# Patient Record
Sex: Female | Born: 1970
Health system: Southern US, Community
[De-identification: ages and names within clinical notes are randomized; demographics above are authoritative.]

## PROBLEM LIST (undated history)

## (undated) DIAGNOSIS — Z87440 Personal history of urinary (tract) infections: Secondary | ICD-10-CM

## (undated) DIAGNOSIS — Z85850 Personal history of malignant neoplasm of thyroid: Secondary | ICD-10-CM

## (undated) DIAGNOSIS — H9319 Tinnitus, unspecified ear: Secondary | ICD-10-CM

## (undated) DIAGNOSIS — N80109 Endometriosis of ovary, unspecified side, unspecified depth: Secondary | ICD-10-CM

## (undated) DIAGNOSIS — I341 Nonrheumatic mitral (valve) prolapse: Secondary | ICD-10-CM

## (undated) DIAGNOSIS — N898 Other specified noninflammatory disorders of vagina: Secondary | ICD-10-CM

## (undated) DIAGNOSIS — L2089 Other atopic dermatitis: Secondary | ICD-10-CM

## (undated) DIAGNOSIS — B3731 Acute candidiasis of vulva and vagina: Secondary | ICD-10-CM

## (undated) DIAGNOSIS — R809 Proteinuria, unspecified: Secondary | ICD-10-CM

## (undated) DIAGNOSIS — N83209 Unspecified ovarian cyst, unspecified side: Secondary | ICD-10-CM

## (undated) DIAGNOSIS — J309 Allergic rhinitis, unspecified: Secondary | ICD-10-CM

## (undated) DIAGNOSIS — R102 Pelvic and perineal pain: Secondary | ICD-10-CM

## (undated) DIAGNOSIS — B373 Candidiasis of vulva and vagina: Secondary | ICD-10-CM

## (undated) DIAGNOSIS — Z8619 Personal history of other infectious and parasitic diseases: Secondary | ICD-10-CM

## (undated) DIAGNOSIS — J0100 Acute maxillary sinusitis, unspecified: Secondary | ICD-10-CM

## (undated) DIAGNOSIS — K5792 Diverticulitis of intestine, part unspecified, without perforation or abscess without bleeding: Secondary | ICD-10-CM

## (undated) DIAGNOSIS — Z8742 Personal history of other diseases of the female genital tract: Secondary | ICD-10-CM

## (undated) DIAGNOSIS — G43009 Migraine without aura, not intractable, without status migrainosus: Secondary | ICD-10-CM

## (undated) DIAGNOSIS — L309 Dermatitis, unspecified: Secondary | ICD-10-CM

## (undated) DIAGNOSIS — R112 Nausea with vomiting, unspecified: Secondary | ICD-10-CM

## (undated) DIAGNOSIS — L290 Pruritus ani: Secondary | ICD-10-CM

## (undated) DIAGNOSIS — K649 Unspecified hemorrhoids: Secondary | ICD-10-CM

## (undated) DIAGNOSIS — N760 Acute vaginitis: Secondary | ICD-10-CM

## (undated) DIAGNOSIS — R87619 Unspecified abnormal cytological findings in specimens from cervix uteri: Secondary | ICD-10-CM

## (undated) DIAGNOSIS — IMO0001 Reserved for inherently not codable concepts without codable children: Secondary | ICD-10-CM

## (undated) DIAGNOSIS — Z8679 Personal history of other diseases of the circulatory system: Secondary | ICD-10-CM

## (undated) DIAGNOSIS — R109 Unspecified abdominal pain: Secondary | ICD-10-CM

## (undated) DIAGNOSIS — Z9889 Other specified postprocedural states: Secondary | ICD-10-CM

## (undated) DIAGNOSIS — N809 Endometriosis, unspecified: Secondary | ICD-10-CM

## (undated) DIAGNOSIS — D219 Benign neoplasm of connective and other soft tissue, unspecified: Secondary | ICD-10-CM

## (undated) DIAGNOSIS — R1032 Left lower quadrant pain: Secondary | ICD-10-CM

## (undated) DIAGNOSIS — IMO0002 Reserved for concepts with insufficient information to code with codable children: Secondary | ICD-10-CM

## (undated) DIAGNOSIS — K219 Gastro-esophageal reflux disease without esophagitis: Secondary | ICD-10-CM

## (undated) DIAGNOSIS — N801 Endometriosis of ovary: Secondary | ICD-10-CM

## (undated) HISTORY — DX: Personal history of other diseases of the circulatory system: Z86.79

## (undated) HISTORY — DX: Candidiasis of vulva and vagina: B37.3

## (undated) HISTORY — DX: Reserved for inherently not codable concepts without codable children: IMO0001

## (undated) HISTORY — DX: Tinnitus, unspecified ear: H93.19

## (undated) HISTORY — DX: Gastro-esophageal reflux disease without esophagitis: K21.9

## (undated) HISTORY — DX: Dermatitis, unspecified: L30.9

## (undated) HISTORY — DX: Proteinuria, unspecified: R80.9

## (undated) HISTORY — DX: Personal history of other diseases of the female genital tract: Z87.42

## (undated) HISTORY — DX: Left lower quadrant pain: R10.32

## (undated) HISTORY — DX: Unspecified abnormal cytological findings in specimens from cervix uteri: R87.619

## (undated) HISTORY — DX: Acute vaginitis: N76.0

## (undated) HISTORY — DX: Unspecified abdominal pain: R10.9

## (undated) HISTORY — DX: Allergic rhinitis, unspecified: J30.9

## (undated) HISTORY — DX: Endometriosis, unspecified: N80.9

## (undated) HISTORY — PX: LAPAROSCOPIC OVARIAN CYSTECTOMY: SUR786

## (undated) HISTORY — DX: Diverticulitis of intestine, part unspecified, without perforation or abscess without bleeding: K57.92

## (undated) HISTORY — DX: Unspecified ovarian cyst, unspecified side: N83.209

## (undated) HISTORY — DX: Benign neoplasm of connective and other soft tissue, unspecified: D21.9

## (undated) HISTORY — DX: Personal history of urinary (tract) infections: Z87.440

## (undated) HISTORY — DX: Endometriosis of ovary, unspecified side, unspecified depth: N80.109

## (undated) HISTORY — DX: Personal history of other infectious and parasitic diseases: Z86.19

## (undated) HISTORY — DX: Reserved for concepts with insufficient information to code with codable children: IMO0002

## (undated) HISTORY — DX: Acute maxillary sinusitis, unspecified: J01.00

## (undated) HISTORY — PX: WISDOM TOOTH EXTRACTION: SHX21

## (undated) HISTORY — DX: Pruritus ani: L29.0

## (undated) HISTORY — DX: Acute candidiasis of vulva and vagina: B37.31

## (undated) HISTORY — DX: Nonrheumatic mitral (valve) prolapse: I34.1

## (undated) HISTORY — DX: Migraine without aura, not intractable, without status migrainosus: G43.009

## (undated) HISTORY — DX: Other specified noninflammatory disorders of vagina: N89.8

## (undated) HISTORY — DX: Pelvic and perineal pain: R10.2

## (undated) HISTORY — DX: Other atopic dermatitis: L20.89

## (undated) HISTORY — DX: Personal history of malignant neoplasm of thyroid: Z85.850

## (undated) HISTORY — DX: Unspecified hemorrhoids: K64.9

## (undated) HISTORY — DX: Endometriosis of ovary: N80.1

---

## 1999-01-10 ENCOUNTER — Other Ambulatory Visit: Admission: RE | Admit: 1999-01-10 | Discharge: 1999-01-10 | Payer: Self-pay | Admitting: *Deleted

## 2000-01-09 ENCOUNTER — Other Ambulatory Visit: Admission: RE | Admit: 2000-01-09 | Discharge: 2000-01-09 | Payer: Self-pay | Admitting: *Deleted

## 2000-12-31 ENCOUNTER — Other Ambulatory Visit: Admission: RE | Admit: 2000-12-31 | Discharge: 2000-12-31 | Payer: Self-pay | Admitting: Obstetrics and Gynecology

## 2001-01-20 ENCOUNTER — Other Ambulatory Visit: Admission: RE | Admit: 2001-01-20 | Discharge: 2001-01-20 | Payer: Self-pay | Admitting: Obstetrics and Gynecology

## 2001-05-11 ENCOUNTER — Inpatient Hospital Stay (HOSPITAL_COMMUNITY): Admission: AD | Admit: 2001-05-11 | Discharge: 2001-05-11 | Payer: Self-pay | Admitting: Obstetrics and Gynecology

## 2001-06-04 ENCOUNTER — Inpatient Hospital Stay (HOSPITAL_COMMUNITY): Admission: AD | Admit: 2001-06-04 | Discharge: 2001-06-04 | Payer: Self-pay | Admitting: Obstetrics & Gynecology

## 2001-06-05 ENCOUNTER — Inpatient Hospital Stay (HOSPITAL_COMMUNITY): Admission: AD | Admit: 2001-06-05 | Discharge: 2001-06-05 | Payer: Self-pay | Admitting: Obstetrics and Gynecology

## 2001-06-06 ENCOUNTER — Inpatient Hospital Stay (HOSPITAL_COMMUNITY): Admission: AD | Admit: 2001-06-06 | Discharge: 2001-06-06 | Payer: Self-pay | Admitting: *Deleted

## 2001-07-30 ENCOUNTER — Inpatient Hospital Stay (HOSPITAL_COMMUNITY): Admission: AD | Admit: 2001-07-30 | Discharge: 2001-08-02 | Payer: Self-pay | Admitting: Obstetrics and Gynecology

## 2001-08-04 ENCOUNTER — Encounter: Admission: RE | Admit: 2001-08-04 | Discharge: 2001-09-03 | Payer: Self-pay | Admitting: Obstetrics and Gynecology

## 2001-09-29 ENCOUNTER — Other Ambulatory Visit: Admission: RE | Admit: 2001-09-29 | Discharge: 2001-09-29 | Payer: Self-pay | Admitting: Obstetrics and Gynecology

## 2002-05-12 ENCOUNTER — Encounter: Payer: Self-pay | Admitting: Otolaryngology

## 2002-05-12 ENCOUNTER — Encounter: Admission: RE | Admit: 2002-05-12 | Discharge: 2002-05-12 | Payer: Self-pay | Admitting: Otolaryngology

## 2002-10-08 ENCOUNTER — Other Ambulatory Visit: Admission: RE | Admit: 2002-10-08 | Discharge: 2002-10-08 | Payer: Self-pay | Admitting: Obstetrics and Gynecology

## 2003-06-29 ENCOUNTER — Encounter: Admission: RE | Admit: 2003-06-29 | Discharge: 2003-06-29 | Payer: Self-pay | Admitting: Obstetrics & Gynecology

## 2003-06-29 ENCOUNTER — Encounter: Payer: Self-pay | Admitting: Obstetrics & Gynecology

## 2003-10-12 ENCOUNTER — Other Ambulatory Visit: Admission: RE | Admit: 2003-10-12 | Discharge: 2003-10-12 | Payer: Self-pay | Admitting: Obstetrics and Gynecology

## 2004-04-20 ENCOUNTER — Inpatient Hospital Stay (HOSPITAL_COMMUNITY): Admission: AD | Admit: 2004-04-20 | Discharge: 2004-04-20 | Payer: Self-pay | Admitting: Obstetrics and Gynecology

## 2004-04-21 ENCOUNTER — Inpatient Hospital Stay (HOSPITAL_COMMUNITY): Admission: AD | Admit: 2004-04-21 | Discharge: 2004-04-21 | Payer: Self-pay | Admitting: Obstetrics and Gynecology

## 2004-04-23 ENCOUNTER — Inpatient Hospital Stay (HOSPITAL_COMMUNITY): Admission: AD | Admit: 2004-04-23 | Discharge: 2004-04-24 | Payer: Self-pay | Admitting: Obstetrics & Gynecology

## 2004-05-15 ENCOUNTER — Encounter (INDEPENDENT_AMBULATORY_CARE_PROVIDER_SITE_OTHER): Payer: Self-pay | Admitting: Specialist

## 2004-05-15 ENCOUNTER — Inpatient Hospital Stay (HOSPITAL_COMMUNITY): Admission: AD | Admit: 2004-05-15 | Discharge: 2004-05-17 | Payer: Self-pay | Admitting: *Deleted

## 2004-10-28 DIAGNOSIS — L290 Pruritus ani: Secondary | ICD-10-CM

## 2004-10-28 DIAGNOSIS — L309 Dermatitis, unspecified: Secondary | ICD-10-CM

## 2004-10-28 DIAGNOSIS — R102 Pelvic and perineal pain: Secondary | ICD-10-CM

## 2004-10-28 HISTORY — DX: Pruritus ani: L29.0

## 2004-10-28 HISTORY — DX: Dermatitis, unspecified: L30.9

## 2004-10-28 HISTORY — DX: Pelvic and perineal pain: R10.2

## 2005-01-16 ENCOUNTER — Encounter: Admission: RE | Admit: 2005-01-16 | Discharge: 2005-01-16 | Payer: Self-pay | Admitting: Otolaryngology

## 2005-08-21 ENCOUNTER — Other Ambulatory Visit: Admission: RE | Admit: 2005-08-21 | Discharge: 2005-08-21 | Payer: Self-pay | Admitting: Obstetrics and Gynecology

## 2005-10-28 DIAGNOSIS — K649 Unspecified hemorrhoids: Secondary | ICD-10-CM

## 2005-10-28 DIAGNOSIS — B373 Candidiasis of vulva and vagina: Secondary | ICD-10-CM

## 2005-10-28 DIAGNOSIS — B3731 Acute candidiasis of vulva and vagina: Secondary | ICD-10-CM

## 2005-10-28 HISTORY — DX: Unspecified hemorrhoids: K64.9

## 2005-10-28 HISTORY — DX: Candidiasis of vulva and vagina: B37.3

## 2005-10-28 HISTORY — DX: Acute candidiasis of vulva and vagina: B37.31

## 2006-08-22 ENCOUNTER — Other Ambulatory Visit: Admission: RE | Admit: 2006-08-22 | Discharge: 2006-08-22 | Payer: Self-pay | Admitting: Obstetrics and Gynecology

## 2006-10-28 ENCOUNTER — Encounter: Payer: Self-pay | Admitting: Family Medicine

## 2006-10-28 DIAGNOSIS — N83209 Unspecified ovarian cyst, unspecified side: Secondary | ICD-10-CM

## 2006-10-28 DIAGNOSIS — Z8742 Personal history of other diseases of the female genital tract: Secondary | ICD-10-CM

## 2006-10-28 HISTORY — DX: Unspecified ovarian cyst, unspecified side: N83.209

## 2006-10-28 HISTORY — DX: Personal history of other diseases of the female genital tract: Z87.42

## 2007-02-03 ENCOUNTER — Encounter: Payer: Self-pay | Admitting: Family Medicine

## 2007-02-03 ENCOUNTER — Ambulatory Visit: Payer: Self-pay | Admitting: Family Medicine

## 2007-02-03 DIAGNOSIS — G43009 Migraine without aura, not intractable, without status migrainosus: Secondary | ICD-10-CM

## 2007-02-03 DIAGNOSIS — K219 Gastro-esophageal reflux disease without esophagitis: Secondary | ICD-10-CM

## 2007-02-03 DIAGNOSIS — H9319 Tinnitus, unspecified ear: Secondary | ICD-10-CM | POA: Insufficient documentation

## 2007-02-03 DIAGNOSIS — J309 Allergic rhinitis, unspecified: Secondary | ICD-10-CM

## 2007-02-03 DIAGNOSIS — L2089 Other atopic dermatitis: Secondary | ICD-10-CM

## 2007-02-18 ENCOUNTER — Ambulatory Visit: Payer: Self-pay | Admitting: Family Medicine

## 2007-02-18 LAB — CONVERTED CEMR LAB
BUN: 12 mg/dL (ref 6–23)
Basophils Relative: 0.2 % (ref 0.0–1.0)
Bilirubin, Direct: 0.1 mg/dL (ref 0.0–0.3)
CO2: 30 meq/L (ref 19–32)
Cholesterol: 167 mg/dL (ref 0–200)
Creatinine, Ser: 0.8 mg/dL (ref 0.4–1.2)
Eosinophils Relative: 4.4 % (ref 0.0–5.0)
GFR calc Af Amer: 106 mL/min
Glucose, Bld: 96 mg/dL (ref 70–99)
HCT: 40.1 % (ref 36.0–46.0)
HDL: 64.6 mg/dL (ref >39.0)
Hemoglobin: 14.2 g/dL (ref 12.0–15.0)
Lymphocytes Relative: 37.7 % (ref 12.0–46.0)
Monocytes Absolute: 0.6 10*3/uL (ref 0.2–0.7)
Monocytes Relative: 9.3 % (ref 3.0–11.0)
Neutro Abs: 3.2 10*3/uL (ref 1.4–7.7)
Neutrophils Relative %: 48.4 % (ref 43.0–77.0)
Potassium: 4.3 meq/L (ref 3.5–5.1)
RDW: 13.1 % (ref 11.5–14.6)
Sodium: 141 meq/L (ref 135–145)
TSH: 1.88 microintl units/mL (ref 0.35–5.50)
Total Bilirubin: 0.7 mg/dL (ref 0.3–1.2)
Total Protein: 6.8 g/dL (ref 6.0–8.3)
VLDL: 9 mg/dL (ref 0–40)

## 2007-03-13 ENCOUNTER — Telehealth: Payer: Self-pay | Admitting: Family Medicine

## 2007-07-06 ENCOUNTER — Telehealth: Payer: Self-pay | Admitting: Family Medicine

## 2007-08-31 ENCOUNTER — Ambulatory Visit: Payer: Self-pay | Admitting: Internal Medicine

## 2007-09-01 ENCOUNTER — Telehealth (INDEPENDENT_AMBULATORY_CARE_PROVIDER_SITE_OTHER): Payer: Self-pay | Admitting: *Deleted

## 2008-01-16 ENCOUNTER — Encounter: Payer: Self-pay | Admitting: Family Medicine

## 2008-01-20 ENCOUNTER — Telehealth: Payer: Self-pay | Admitting: Family Medicine

## 2008-01-28 ENCOUNTER — Ambulatory Visit: Payer: Self-pay | Admitting: Family Medicine

## 2008-01-28 LAB — CONVERTED CEMR LAB: Rapid Strep: NEGATIVE

## 2008-02-15 ENCOUNTER — Telehealth: Payer: Self-pay | Admitting: Internal Medicine

## 2008-03-18 ENCOUNTER — Encounter: Payer: Self-pay | Admitting: Internal Medicine

## 2008-06-15 ENCOUNTER — Ambulatory Visit: Payer: Self-pay | Admitting: Family Medicine

## 2008-06-17 ENCOUNTER — Telehealth: Payer: Self-pay | Admitting: Family Medicine

## 2008-07-24 ENCOUNTER — Encounter: Admission: RE | Admit: 2008-07-24 | Discharge: 2008-07-24 | Payer: Self-pay | Admitting: Orthopedic Surgery

## 2008-07-26 ENCOUNTER — Ambulatory Visit: Payer: Self-pay | Admitting: Family Medicine

## 2008-07-28 LAB — HM PAP SMEAR

## 2008-08-31 ENCOUNTER — Ambulatory Visit: Payer: Self-pay | Admitting: Family Medicine

## 2008-10-27 ENCOUNTER — Ambulatory Visit: Payer: Self-pay | Admitting: Family Medicine

## 2008-10-27 LAB — CONVERTED CEMR LAB: Rapid Strep: NEGATIVE

## 2008-10-28 DIAGNOSIS — R1032 Left lower quadrant pain: Secondary | ICD-10-CM

## 2008-10-28 DIAGNOSIS — B373 Candidiasis of vulva and vagina: Secondary | ICD-10-CM

## 2008-10-28 DIAGNOSIS — R809 Proteinuria, unspecified: Secondary | ICD-10-CM

## 2008-10-28 DIAGNOSIS — B3731 Acute candidiasis of vulva and vagina: Secondary | ICD-10-CM

## 2008-10-28 HISTORY — DX: Acute candidiasis of vulva and vagina: B37.31

## 2008-10-28 HISTORY — DX: Proteinuria, unspecified: R80.9

## 2008-10-28 HISTORY — DX: Candidiasis of vulva and vagina: B37.3

## 2008-10-28 HISTORY — DX: Left lower quadrant pain: R10.32

## 2008-10-31 ENCOUNTER — Telehealth: Payer: Self-pay | Admitting: Family Medicine

## 2008-11-09 ENCOUNTER — Telehealth: Payer: Self-pay | Admitting: Family Medicine

## 2008-12-15 ENCOUNTER — Telehealth: Payer: Self-pay | Admitting: Family Medicine

## 2009-04-20 ENCOUNTER — Encounter (INDEPENDENT_AMBULATORY_CARE_PROVIDER_SITE_OTHER): Payer: Self-pay | Admitting: Obstetrics and Gynecology

## 2009-04-20 ENCOUNTER — Ambulatory Visit (HOSPITAL_COMMUNITY): Admission: RE | Admit: 2009-04-20 | Discharge: 2009-04-20 | Payer: Self-pay | Admitting: Obstetrics and Gynecology

## 2009-06-13 ENCOUNTER — Ambulatory Visit: Payer: Self-pay | Admitting: Family Medicine

## 2009-06-13 DIAGNOSIS — N801 Endometriosis of ovary: Secondary | ICD-10-CM

## 2009-06-20 ENCOUNTER — Ambulatory Visit: Payer: Self-pay | Admitting: Family Medicine

## 2009-06-26 LAB — CONVERTED CEMR LAB
ALT: 17 units/L (ref 0–35)
Albumin: 4.2 g/dL (ref 3.5–5.2)
Alkaline Phosphatase: 54 units/L (ref 39–117)
Basophils Relative: 0 % (ref 0.0–3.0)
Bilirubin, Direct: 0 mg/dL (ref 0.0–0.3)
CO2: 28 meq/L (ref 19–32)
Chloride: 107 meq/L (ref 96–112)
Creatinine, Ser: 0.8 mg/dL (ref 0.4–1.2)
Eosinophils Absolute: 0.3 10*3/uL (ref 0.0–0.7)
Eosinophils Relative: 4.6 % (ref 0.0–5.0)
Hemoglobin: 14.9 g/dL (ref 12.0–15.0)
LDL Cholesterol: 96 mg/dL (ref 0–99)
Lymphocytes Relative: 38 % (ref 12.0–46.0)
MCHC: 33.7 g/dL (ref 30.0–36.0)
MCV: 94.9 fL (ref 78.0–100.0)
Monocytes Absolute: 0.5 10*3/uL (ref 0.1–1.0)
Neutro Abs: 3 10*3/uL (ref 1.4–7.7)
Neutrophils Relative %: 49.6 % (ref 43.0–77.0)
RBC: 4.66 M/uL (ref 3.87–5.11)
Sodium: 139 meq/L (ref 135–145)
Total CHOL/HDL Ratio: 3
Total Protein: 7.5 g/dL (ref 6.0–8.3)
Triglycerides: 65 mg/dL (ref 0.0–149.0)
WBC: 6.1 10*3/uL (ref 4.5–10.5)

## 2009-07-19 ENCOUNTER — Encounter: Payer: Self-pay | Admitting: Family Medicine

## 2009-08-03 ENCOUNTER — Ambulatory Visit: Payer: Self-pay | Admitting: Family Medicine

## 2009-11-02 ENCOUNTER — Ambulatory Visit: Payer: Self-pay | Admitting: Family Medicine

## 2009-11-02 DIAGNOSIS — J0100 Acute maxillary sinusitis, unspecified: Secondary | ICD-10-CM

## 2009-11-23 ENCOUNTER — Telehealth: Payer: Self-pay | Admitting: Family Medicine

## 2009-12-14 ENCOUNTER — Telehealth (INDEPENDENT_AMBULATORY_CARE_PROVIDER_SITE_OTHER): Payer: Self-pay | Admitting: *Deleted

## 2009-12-15 ENCOUNTER — Ambulatory Visit: Payer: Self-pay | Admitting: Family Medicine

## 2009-12-15 ENCOUNTER — Telehealth: Payer: Self-pay | Admitting: Family Medicine

## 2009-12-18 ENCOUNTER — Telehealth: Payer: Self-pay | Admitting: Family Medicine

## 2010-04-24 ENCOUNTER — Telehealth: Payer: Self-pay | Admitting: Family Medicine

## 2010-04-25 ENCOUNTER — Encounter: Payer: Self-pay | Admitting: Family Medicine

## 2010-05-02 ENCOUNTER — Telehealth: Payer: Self-pay | Admitting: Family Medicine

## 2010-05-08 ENCOUNTER — Telehealth: Payer: Self-pay | Admitting: Family Medicine

## 2010-05-09 ENCOUNTER — Ambulatory Visit: Payer: Self-pay | Admitting: Family Medicine

## 2010-05-09 DIAGNOSIS — B373 Candidiasis of vulva and vagina: Secondary | ICD-10-CM

## 2010-05-09 DIAGNOSIS — N760 Acute vaginitis: Secondary | ICD-10-CM | POA: Insufficient documentation

## 2010-05-30 ENCOUNTER — Ambulatory Visit: Payer: Self-pay | Admitting: Family Medicine

## 2010-06-12 ENCOUNTER — Telehealth: Payer: Self-pay | Admitting: Family Medicine

## 2010-07-06 ENCOUNTER — Ambulatory Visit: Payer: Self-pay | Admitting: Family Medicine

## 2010-07-06 DIAGNOSIS — R109 Unspecified abdominal pain: Secondary | ICD-10-CM

## 2010-07-06 DIAGNOSIS — IMO0001 Reserved for inherently not codable concepts without codable children: Secondary | ICD-10-CM

## 2010-07-06 LAB — CONVERTED CEMR LAB
Glucose, Urine, Semiquant: NEGATIVE
Ketones, urine, test strip: NEGATIVE
Nitrite: NEGATIVE
Protein, U semiquant: NEGATIVE

## 2010-07-07 ENCOUNTER — Ambulatory Visit: Payer: Self-pay | Admitting: Interventional Radiology

## 2010-07-07 ENCOUNTER — Ambulatory Visit (HOSPITAL_BASED_OUTPATIENT_CLINIC_OR_DEPARTMENT_OTHER): Admission: RE | Admit: 2010-07-07 | Discharge: 2010-07-07 | Payer: Self-pay | Admitting: Family Medicine

## 2010-07-07 LAB — CONVERTED CEMR LAB
BUN: 9 mg/dL (ref 6–23)
Creatinine, Ser: 0.91 mg/dL (ref 0.40–1.20)
Eosinophils Absolute: 0.3 10*3/uL (ref 0.0–0.7)
Eosinophils Relative: 2 % (ref 0–5)
Glucose, Bld: 90 mg/dL (ref 70–99)
HCT: 36.8 % (ref 36.0–46.0)
Hemoglobin: 12.6 g/dL (ref 12.0–15.0)
Lymphs Abs: 2.7 10*3/uL (ref 0.7–4.0)
MCHC: 34.2 g/dL (ref 30.0–36.0)
MCV: 92.2 fL (ref 78.0–100.0)
Monocytes Absolute: 1.1 10*3/uL — ABNORMAL HIGH (ref 0.1–1.0)
Monocytes Relative: 9 % (ref 3–12)
Neutrophils Relative %: 67 % (ref 43–77)
Potassium: 4.1 meq/L (ref 3.5–5.3)
RBC: 3.99 M/uL (ref 3.87–5.11)
WBC: 12.3 10*3/uL — ABNORMAL HIGH (ref 4.0–10.5)

## 2010-07-09 ENCOUNTER — Telehealth (INDEPENDENT_AMBULATORY_CARE_PROVIDER_SITE_OTHER): Payer: Self-pay | Admitting: *Deleted

## 2010-07-09 ENCOUNTER — Ambulatory Visit: Payer: Self-pay | Admitting: Internal Medicine

## 2010-07-09 DIAGNOSIS — N809 Endometriosis, unspecified: Secondary | ICD-10-CM | POA: Insufficient documentation

## 2010-07-09 DIAGNOSIS — R933 Abnormal findings on diagnostic imaging of other parts of digestive tract: Secondary | ICD-10-CM

## 2010-07-09 DIAGNOSIS — R1032 Left lower quadrant pain: Secondary | ICD-10-CM | POA: Insufficient documentation

## 2010-07-09 DIAGNOSIS — R935 Abnormal findings on diagnostic imaging of other abdominal regions, including retroperitoneum: Secondary | ICD-10-CM

## 2010-07-10 ENCOUNTER — Telehealth (INDEPENDENT_AMBULATORY_CARE_PROVIDER_SITE_OTHER): Payer: Self-pay | Admitting: *Deleted

## 2010-07-10 ENCOUNTER — Ambulatory Visit: Payer: Self-pay | Admitting: Internal Medicine

## 2010-07-10 LAB — HM COLONOSCOPY

## 2010-07-11 ENCOUNTER — Telehealth: Payer: Self-pay | Admitting: Internal Medicine

## 2010-07-13 ENCOUNTER — Telehealth: Payer: Self-pay | Admitting: Internal Medicine

## 2010-07-13 ENCOUNTER — Encounter: Payer: Self-pay | Admitting: Internal Medicine

## 2010-07-18 ENCOUNTER — Telehealth: Payer: Self-pay | Admitting: Internal Medicine

## 2010-07-18 ENCOUNTER — Encounter: Payer: Self-pay | Admitting: Internal Medicine

## 2010-07-20 ENCOUNTER — Ambulatory Visit: Payer: Self-pay | Admitting: Internal Medicine

## 2010-08-08 ENCOUNTER — Ambulatory Visit: Payer: Self-pay | Admitting: Internal Medicine

## 2010-08-08 DIAGNOSIS — K5732 Diverticulitis of large intestine without perforation or abscess without bleeding: Secondary | ICD-10-CM | POA: Insufficient documentation

## 2010-08-09 ENCOUNTER — Ambulatory Visit: Payer: Self-pay | Admitting: Family Medicine

## 2010-08-30 ENCOUNTER — Ambulatory Visit: Payer: Self-pay | Admitting: Family Medicine

## 2010-09-06 ENCOUNTER — Telehealth: Payer: Self-pay | Admitting: Family Medicine

## 2010-11-27 NOTE — Progress Notes (Signed)
Summary: med for congestion   Phone Note Call from Patient Call back at [phone]   Caller: Patient Call For: Kerby Nora MD Summary of Call: Patient was just seen this morning and she says that she was not told what to take for the congestion. Please advise. Initial call taken by: Melody Comas,  December 15, 2009 9:57 AM  Follow-up for Phone Call        mucinex DM BID , nasal saline irrigation for nares three times a day  Follow-up by: Kerby Nora MD,  December 15, 2009 11:03 AM  Additional Follow-up for Phone Call Additional follow up Details #1::        Patient advised.Consuello Masse CMA  Additional Follow-up by: Benny Lennert CMA Duncan Dull),  December 15, 2009 11:46 AM

## 2010-11-27 NOTE — Procedures (Signed)
Summary: Colonoscopy  Patient: Anna [name] Note: All result statuses are Final unless otherwise noted.  Tests: (1) Colonoscopy (COL)   COL Colonoscopy           DONE     [facility]     [address]     [place], [place]  [address]           COLONOSCOPY PROCEDURE REPORT           PATIENT:  [name], [name]  MR#:  [id number]     BIRTHDATE:  [birth year], 39 yrs. old  GENDER:  female     ENDOSCOPIST:  Wilhemina Bonito. Eda Keys, MD     REF. BY:  Excell Seltzer, M.D.     PROCEDURE DATE:  07/10/2010     PROCEDURE:  Colonoscopy with biopsy     ASA CLASS:  Class I     INDICATIONS:  Abnormal CT of abdomen, Abdominal pain     MEDICATIONS:   Fentanyl 100 mcg IV, Versed 10 mg IV, Benadryl 50     mg IV           DESCRIPTION OF PROCEDURE:   After the risks benefits and     alternatives of the procedure were thoroughly explained, informed     consent was obtained.  Digital rectal exam was performed and     revealed no abnormalities.   The [id number] endoscope was     introduced through the anus and advanced to the cecum, which was     identified by both the appendix and ileocecal valve, without     limitations.Time to cecum = 7:50 min.  The quality of the prep was     excellent, using MoviPrep.  The instrument was then slowly     withdrawn (time = 8:8min) as the colon was fully examined.     <<PROCEDUREIMAGES>>           FINDINGS:  Focal area of inflammation arising from an diverticulum     and c/w Diverticulitis in the distal ascending colon. Bx of     inflamed tissue taken.  Moderate diverticulosis was found     throughout the colon.  This was otherwise a normal examination of     the colon.  No polyps or cancers were seen.   Retroflexed views in     the rectum revealed no abnormalities.    The scope was then     withdrawn from the patient and the procedure completed.           COMPLICATIONS:  None     ENDOSCOPIC IMPRESSION:     1) Diverticulitis in the ascending colon     2) Moderate  diverticulosis throughout the colon     3) Otherwise normal examination     4) No polyps or cancers           RECOMMENDATIONS:     1) FINISH COURSE OF ANTIBIOTICS     2) Routine colorectal screening recommendationed for "routine     risk" patients with a repeat colonoscopy at age 15 years.     3) Call office next 1-3 days to schedule followup visit with Dr     Marina Goodell in 4 weeks           ______________________________     Wilhemina Bonito. Eda Keys, MD           CC:  Excell Seltzer, MD; The Patient  n.     eSIGNED:   Wilhemina Bonito. Eda Keys at 07/10/2010 04:30 PM           Roslyn Smiling, [phone]  Note: An exclamation mark (!) indicates a result that was not dispersed into the flowsheet. Document Creation Date: 07/10/2010 4:31 PM _______________________________________________________________________  (1) Order result status: Final Collection or observation date-time: 07/10/2010 16:11 Requested date-time:  Receipt date-time:  Reported date-time:  Referring Physician:   Ordering Physician: Fransico Setters [phone]) Specimen Source:  Source: Launa Grill Order Number: [id number] Lab site:   Appended Document: Colonoscopy recall     Procedures Next Due Date:    Colonoscopy: 06/2023

## 2010-11-27 NOTE — Assessment & Plan Note (Signed)
Summary: ABNORMAL CT               (NEW TO GI)      Anna [name]   History of Present Illness Visit Type: consult  Primary GI MD: Yancey Flemings MD Primary Provider: Kerby Nora, MD  Requesting Provider: Kerby Nora, MD  Chief Complaint: LLQ abd pain, diarrhea, constipation, and hemorrhoids  History of Present Illness:   Patient is a 40 year old female referred here for abnormal CTscan. Patient gives a two year history of intermittent LLQ pain / tenderness. Pain used to be worse around menstrual cycle and also during defecation. Last year GYN work up revealed a left ovarian cyst and in June 2010 patient underwent laparoscopic left ovarian cystectomy and right vulvar cyst  removal.  She was also found to have endometriosis at that time. Unfortunately patient developed recurrent LLQ pain within two months of her surgery. Pain intermittent, no longer worse with defecation. Her LLQ is always tender.  Constricting clothes cause increased discomfort. Saw Dr. Pattricia Boss on Friday for one week history of fevers (up to 101 degrees), and LLQ pain. WBC was slightly elevated at 12.3  CTscan obtained and revealed abnormality in right abdomen (see assessment).  Patient on empirical treatment for diverticulitis. She is feeling better but complains of excessive bloating.  No further fevers.  Bowel movement are okay. No rectal bleeding or unusual weight loss.    GI Review of Systems    Reports abdominal pain and  bloating.     Location of  Abdominal pain: LLQ.    Denies acid reflux, belching, chest pain, dysphagia with liquids, dysphagia with solids, heartburn, loss of appetite, nausea, vomiting, vomiting blood, weight loss, and  weight gain.      Reports hemorrhoids.     Denies anal fissure, black tarry stools, change in bowel habit, constipation, diarrhea, diverticulosis, fecal incontinence, heme positive stool, irritable bowel syndrome, jaundice, light color stool, liver problems, rectal bleeding, and  rectal pain.     Current Medications (verified): 1)  Nasonex 50 Mcg/act  Susp (Mometasone Furoate) .... 2 Sprays Per Nostril Daily 2)  Zyrtec-D Allergy & Congestion 5-120 Mg  Tb12 (Cetirizine-Pseudoephedrine) .... Daily As Needed 3)  Multivitamins   Tabs (Multiple Vitamin) .... Take 1 Tablet By Mouth Once A Day 4)  Vitamin B Complex .[name].. 2 Daily 5)  Omega 3 .... 2 Daily 6)  5 Htp .... Daily 7)  Sumatriptan Succinate 100 Mg Tabs (Sumatriptan Succinate) .[name].. 1 Tab By Mouth As Needed Migraine, May Repeat X 1  in 2 Hours If Not Relieved. 8)  Ciprofloxacin Hcl 750 Mg Tabs (Ciprofloxacin Hcl) .[name].. 1 Tab By Mouth Two Times A Day X10 Days 9)  Metronidazole 500 Mg Tabs (Metronidazole) .[name].. 1 Tab By Mouth Every 6 Hours X 10 Days  Allergies (verified): 1)  ! Sulfa 2)  ! Penicillin  Past History:  Past Medical History: MYALGIA (ICD-729.1) ABDOMINAL PAIN, LOWER (ICD-789.09) CANDIDIASIS OF VULVA AND VAGINA (ICD-112.1) UNSPECIFIED VAGINITIS AND VULVOVAGINITIS (ICD-616.10) ACUTE MAXILLARY SINUSITIS (ICD-461.0) SCREENING FOR LIPOID DISORDERS (ICD-V77.91) ENDOMETRIOSIS, OVARY (ICD-617.1) Family Hx of THYROID CANCER, HX OF (ICD-V10.87) GERD (ICD-530.81) COMMON MIGRAINE (ICD-346.10) ALLERGIC RHINITIS (ICD-477.9) ECZEMA, ATOPIC (ICD-691.8) TINNITUS, CHRONIC, LEFT (ICD-388.30)  Past Surgical History: Laparoscopic Cystectomy Ovarian   Family History: father: age 10 healthy mother age 74 thyroid cancer brother: healthy MGM; DM MGF: DM PGF: CABG age 58 Family History of Colon Polyps:Father  No FH of Colon Cancer: Family History of Irritable Bowel Syndrome:Mother   Social  History: Occupation: stay at home mom, part time administrative work Married Childern Former Smoker x 3 pack year history Alcohol use-yes 1-2 beer per week Drug use-no Regular exercise-yes 5 days/ weeks cardio 45 min. Diet:  3 meals, fruit/veggies, no soda, lots of water  Review of Systems       The patient complains of  allergy/sinus, fatigue, fever, headaches-new, muscle pains/cramps, and sore throat.  The patient denies anemia, anxiety-new, arthritis/joint pain, back pain, blood in urine, breast changes/lumps, change in vision, confusion, cough, coughing up blood, depression-new, fainting, hearing problems, heart murmur, heart rhythm changes, itching, menstrual pain, night sweats, nosebleeds, pregnancy symptoms, shortness of breath, skin rash, sleeping problems, swelling of feet/legs, swollen lymph glands, thirst - excessive , urination - excessive , urination changes/pain, urine leakage, vision changes, and voice change.    Vital Signs:  Patient profile:   40 year old female Height:      67.75 inches Weight:      150 pounds BMI:     23.06 BSA:     1.81 Pulse rate:   72 / minute Pulse rhythm:   regular BP sitting:   98 / 60  (left arm) Cuff size:   regular  Vitals Entered By: Ok Anis CMA (July 09, 2010 1:35 PM)  Physical Exam  General:  Well developed, well nourished, no acute distress. Head:  Normocephalic and atraumatic. Eyes:  Conjunctiva pink, no icterus.  Mouth:  No oral lesions. Tongue moist.  Neck:  no obvious masses  Lungs:  Clear throughout to auscultation. Heart:  Regular rate and rhythm; no murmurs, rubs,  or bruits. Abdomen:  Abdomen soft, nondistended. Very mild, localized area of tenderness in LLQ. No obvious masses or hepatomegaly.Normal bowel sounds.  Msk:  Symmetrical with no gross deformities. Normal posture. Extremities:  No palmar erythema, no edema.  Neurologic:  Alert and  oriented x4;  grossly normal neurologically. Skin:  Intact without significant lesions or rashes. Cervical Nodes:  No significant cervical adenopathy. Psych:  Alert and cooperative. Normal mood and affect.   Impression & Recommendations:  Problem # 1:  ABDOMINAL PAIN-LLQ (ICD-789.04) Assessment Deteriorated Two year history of intermittent LLQ pain, persistent despite left ovarian  cystectomy 2010. CTscan reveals abnormal soft tissue in the right abdomen abutting the mesenteric side of the ascending colon and causing fairly extensive surrounding soft tissue abnormality with visible tissue extending into the lumen of the ascending colon.  Primary concern is for colonic neoplasm.  Since right sided diverticulitis cannot be completely excluded and patient has been febrile, she will continue the antibiotics. Her abdominal examination is relatively benign. For further evaluation of CTscan findings, patient will be scheduled for a colonoscopy with biopsies/polypectomy (if indicated).  The risks and benefits of the procedure, as well as alternatives were discussed with the patient and she agrees to proceed.  ADDENDUM: I PERSONALLY INTERVIEWED PATIENT, REVIEWED CT, AND EXAMINED HER. CT / NEOPLASM VS INFLAMMATORY PROCESS. HX C/W LATER. ABD EXAM ESSENTIALLY BENIGN. OK TO PROCEED W/ COLOOSCOPY TO CLARIFY. DISCUSSED W/ PT IN DETAIL   Problem # 2:  ENDOMETRIOSIS (ICD-617.9) Assessment: Comment Only Based on operative report June 2010, appeared mild.   Other Orders: Colonoscopy (Colon)  Patient Instructions: 1)  We have scheduled the Colonoscopy with Dr. Yancey Flemings on 07-10-10. 2)  Directions and brochure provided. 3)  [facility] Patient Information Guide given to patient. 4)  We sent the prescription for the Colonoscopy prep to [facility]. 5)  Copy sent to :  Kerby Nora, MD 6)  The medication list was reviewed and reconciled.  All changed / newly prescribed medications were explained.  A complete medication list was provided to the patient / caregiver. Prescriptions: MOVIPREP 100 GM  SOLR (PEG-KCL-NACL-NASULF-NA ASC-C) As per prep instructions.  #1 x 0   Entered by:   Lowry Ram NCMA   Authorized by:   Willette Cluster NP   Signed by:   Lowry Ram NCMA on 07/09/2010   Method used:   Electronically to        [facility]* (retail)       [address]       [place], [place]  [id number]       Ph: [phone]       Fax: [phone]   RxID:   [id number]

## 2010-11-27 NOTE — Assessment & Plan Note (Signed)
Summary: FEVER, ACHY   Vital Signs:  Patient profile:   40 year old female Weight:      147.5 pounds Temp:     98.7 degrees F oral Pulse rate:   60 / minute Pulse rhythm:   regular BP sitting:   94 / 60  (right arm) Cuff size:   regular  Vitals Entered By: Lowella Petties CMA (July 06, 2010 5:03 PM) CC: Achy, fevers, low abd pain   History of Present Illness: 3-4 days of body ache, joint pain. Started temperature of 101.4 Wed night. Last night severe low abdominal pain. No diarrhea, no constipation, no blood in stool. No worse with eating. Last night worst left lower abdomen...burning pain.   Nothing worsens, better with ibuprofen.   Family with URI symptoms.  Had [place] of ovary on right side last week for intermittant pain .Marland Kitchenhas history of endometriosis.  Had Dr. Reola Calkins check labs for feeling blah , joint ache body ache in last 3-4 months. ANA, RF, anti CCp, TSH , vit D ..Allergies: ! SULFA All normal in last month.  Problems Prior to Update: 1)  Candidiasis of Vulva and Vagina  (ICD-112.1) 2)  Unspecified Vaginitis and Vulvovaginitis  (ICD-616.10) 3)  Acute Maxillary Sinusitis  (ICD-461.0) 4)  Screening For Lipoid Disorders  (ICD-V77.91) 5)  Endometriosis, Ovary  (ICD-617.1) 6)  Fh of Thyroid Cancer, Hx of  (ICD-V10.87) 7)  Gerd  (ICD-530.81) 8)  Common Migraine  (ICD-346.10) 9)  Allergic Rhinitis  (ICD-477.9) 10)  Eczema, Atopic  (ICD-691.8) 11)  Tinnitus, Chronic, Left  (ICD-388.30)  Current Medications (verified): 1)  Nasonex 50 Mcg/act  Susp (Mometasone Furoate) .... 2 Sprays Per Nostril Daily 2)  Zyrtec-D Allergy & Congestion 5-120 Mg  Tb12 (Cetirizine-Pseudoephedrine) .... Daily As Needed 3)  Multivitamins   Tabs (Multiple Vitamin) .... Take 1 Tablet By Mouth Once A Day 4)  Vitamin B Complex .[name].. 2 Daily 5)  Omega 3 .... 2 Daily 6)  5 Htp .... Daily 7)  Sumatriptan Succinate 100 Mg Tabs (Sumatriptan Succinate) .[name].. 1 Tab By Mouth As Needed Migraine, May  Repeat X 1  in 2 Hours If Not Relieved.  Allergies (verified): 1)  ! Sulfa 2)  ! Penicillin  Past History:  Past medical, surgical, family and social histories (including risk factors) reviewed, and no changes noted (except as noted below).  Past Medical History: Reviewed history from 02/03/2007 and no changes required. Allergic rhinitis GERD  Past Surgical History: Reviewed history from 02/03/2007 and no changes required. Denies surgical history  Family History: Reviewed history from 02/03/2007 and no changes required. father: age 23 healthy mother age 66 thyroid cancer brother: healthy MGM; DM MGF: DM PGF: CABG age 39  Social History: Reviewed history from 02/03/2007 and no changes required. Occupation: stay at home mom, part time administrative work Married Former Smoker x 3 pack year history Alcohol use-yes 1-2 beer per week Drug use-no Regular exercise-yes 5 days/ weeks cardio 45 min. Diet:  3 meals, fruit/veggies, no soda, lots of water  Review of Systems General:  Complains of fatigue and fever. CV:  Denies chest pain or discomfort. Resp:  Denies shortness of breath; sore throat  runny nose last week resolved now.[name] GI:  Denies abdominal pain. GU:  Denies hematuria, urinary frequency, and urinary hesitancy.  Physical Exam  General:  fatigued appearing female in NAD Eyes:  No corneal or conjunctival inflammation noted. EOMI. Perrla. Funduscopic exam benign, without hemorrhages, exudates or papilledema. Vision grossly normal. Ears:  External ear exam shows no significant lesions or deformities.  Otoscopic examination reveals clear canals, tympanic membranes are intact bilaterally without bulging, retraction, inflammation or discharge. Hearing is grossly normal bilaterally. Nose:  External nasal examination shows no deformity or inflammation. Nasal mucosa are pink and moist without lesions or exudates. Mouth:  MMM Neck:   no cervical or supraclavicular  lymphadenopathy  no cervical or supraclavicular lymphadenopathy  Lungs:  Normal respiratory effort, chest expands symmetrically. Lungs are clear to auscultation, no crackles or wheezes. Heart:  Normal rate and regular rhythm. S1 and S2 normal without gallop, murmur, click, rub or other extra sounds. Abdomen:  ttp left mid abdomen although mild diffuse tenderness, no rebound, no guardingnormal bowel sounds, no abdominal hernia, no inguinal hernia, no hepatomegaly, and no splenomegaly.   Genitalia:  Pelvic Exam:        External: normal female genitalia without lesions or masses        Vagina: normal without lesions or masses        Cervix: normal without lesions or masses        Adnexa: normal bimanual exam without masses or fullness        Uterus: normal by palpation        Pap smear: not performed Pulses:  R and L posterior tibial pulses are full and equal bilaterally  Extremities:  no edema   Impression & Recommendations:  Problem # 1:  ABDOMINAL PAIN, LOWER (ICD-789.09) No tenderness over cervix and per pt low risk so no GC/chlam done. Urine clear no suggestion stones or infection.  No pain in adenexa.Marland Kitchenand recent US ovaries nml. No pregnancy. Diverticultitis unlikely given age...but consider if fever continuing.  Most likely viral syndrome...viral GI. Treat with ibuprofen for fever, push fluids. Call Monday if not imrpoving....consider abd/pelvic CT.      Orders: UA Dipstick w/o Micro (manual) ([address]) Urine Pregnancy Test  ([address]) Specimen Handling ([address]) T-Basic Metabolic Panel ([address]-91478) T-CBC w/Diff ([address])  Problem # 2:  MYALGIA (ICD-729.1) Currently ongoing with flu like illness...but has had intermittant problems in last 3 months. Reviewed autoimmune labs from Dr. Reola Calkins.  Problem # 3:  ENDOMETRIOSIS, OVARY (ICD-617.1) Hx of ..recent start on OCPs to help control apin associated with this per Pt from GYN.  Complete Medication List: 1)  Nasonex 50  Mcg/act Susp (Mometasone furoate) .... 2 sprays per nostril daily 2)  Zyrtec-d Allergy & Congestion 5-120 Mg Tb12 (Cetirizine-pseudoephedrine) .... Daily as needed 3)  Multivitamins Tabs (Multiple vitamin) .... Take 1 tablet by mouth once a day 4)  Vitamin B Complex  .[name].. 2 daily 5)  Omega 3  .... 2 daily 6)  5 Htp  .... Daily 7)  Sumatriptan Succinate 100 Mg Tabs (Sumatriptan succinate) .[name].. 1 tab by mouth as needed migraine, may repeat x 1  in 2 hours if not relieved.  Patient Instructions: 1)  Treat with ibuprofen for fever, push fluids. 2)  Rest. 3)   I will call with lab results. 4)  Call Monday if not imrpoving.  5)   If severe pain go to ER.  6)  Cell [phone]  Prior Medications (reviewed today): NASONEX 50 MCG/ACT  SUSP (MOMETASONE FUROATE) 2 sprays per nostril daily ZYRTEC-D ALLERGY & CONGESTION 5-120 MG  TB12 (CETIRIZINE-PSEUDOEPHEDRINE) daily as needed MULTIVITAMINS   TABS (MULTIPLE VITAMIN) Take 1 tablet by mouth once a day VITAMIN B COMPLEX () 2 daily OMEGA 3 () 2 daily 5 HTP () daily SUMATRIPTAN SUCCINATE 100 MG TABS (SUMATRIPTAN SUCCINATE) 1  tab by mouth as needed migraine, May repeat x 1  in 2 hours if not relieved. Current Allergies (reviewed today): ! SULFA ! PENICILLIN  Laboratory Results   Urine Tests  Date/Time Received: July 06, 2010 5:08 PM  Date/Time Reported: July 06, 2010 5:08 PM   Routine Urinalysis   Color: yellow Appearance: Clear Glucose: negative   (Normal Range: Negative) Bilirubin: negative   (Normal Range: Negative) Ketone: negative   (Normal Range: Negative) Spec. Gravity: 1.025   (Normal Range: 1.003-1.035) Blood: trace-intact   (Normal Range: Negative) pH: 6.0   (Normal Range: 5.0-8.0) Protein: negative   (Normal Range: Negative) Urobilinogen: 0.2   (Normal Range: 0-1) Nitrite: negative   (Normal Range: Negative) Leukocyte Esterace: negative   (Normal Range: Negative)    Comments: Very small trace of red cells.

## 2010-11-27 NOTE — Progress Notes (Signed)
Summary: work-in  Phone Note Call from Patient Call back at [phone]   Caller: Patient Call For: Dr. Marina Goodell Reason for Call: Talk to Nurse Summary of Call: pt states she was told by Dr. Marina Goodell to see him within 3-4 weeks from procedure... nothing available Initial call taken by: Vallarie Mare,  July 11, 2010 9:29 AM  Follow-up for Phone Call        Pt. ntfd. that I scheduled her for R.O.V. on 10 12/11 at 8:45 a.m. Follow-up by: Teryl Lucy RN,  July 11, 2010 10:03 AM

## 2010-11-27 NOTE — Progress Notes (Signed)
Summary: abd pain / recent diverticulitis  Phone Note Call from Patient Call back at [phone]   Caller: Patient Call For: Dr. Marina Goodell Reason for Call: Talk to Nurse Summary of Call: pt still experiencing a lot of abd pain and is almost out of abx rx... had COL last Tuesday and dx with Diverticulitis Initial call taken by: Vallarie Mare,  July 13, 2010 8:05 AM  Follow-up for Phone Call        Is on Cipro 750 mg. b.i.d and Flagyl 500mg  every 6 hrs. and they end on "Sunday.Asking if she should have refills as she is doing much better but but still has l.l.q. pain that is a 3-4 level  and temp. ranges from 98.7-99.5. Tylenol completely relieves pain. Follow-up by: Cheryl Lohr RN,  July 13, 2010 8:20 AM  Additional Follow-up for Phone Call Additional follow up Details #1::        ok to treat with an additional week of same antibiotics Additional Follow-up by: John N Perry MD,  July 13, 2010 8:49 AM    Additional Follow-up for Phone Call Additional follow up Details #2::    Pt. ntfd. and rx's. sent to pharmacy Follow-up by: Cheryl Lohr RN,  July 13, 2010 9:13 AM  New/Updated Medications: METRONIDAZOLE 500 MG TABS (METRONIDAZOLE) Take 1 p.o.every 6 hrs x1 week starting 9/19/1 CIPROFLOXACIN HCL 750 MG TABS (CIPROFLOXACIN HCL) take 1 p.o.b.i.d. for 1 week starting 07/16/10 Prescriptions: CIPROFLOXACIN HCL 750 MG TABS (CIPROFLOXACIN HCL) take 1 p.o.b.i.d. for 1 week starting 07/16/10  #14 x 0   Entered by:   Cheryl Lohr RN   Authorized by:   John N Perry MD   Signed by:   Cheryl Lohr RN on 07/13/2010   Method used:   Electronically to        [facility]* (retail)       [address]       [place], [place]  [address]       Ph: [phone]       Fax: [phone]   RxID:   [id number] METRONIDAZOLE 500 MG TABS (METRONIDAZOLE) Take 1 p.o.every 6 hrs x1 week starting 9/19/1  #28 x 0   Entered by:   Cheryl Lohr RN   Authorized by:   John N Perry MD   Signed by:    Cheryl Lohr RN on 07/13/2010   Method used:   Electronically to        [facility]* (retail)       [address]       [place], [place]  [address]       Ph: [phone]       Fax: [phone]   RxID:   [id number]

## 2010-11-27 NOTE — Assessment & Plan Note (Signed)
Summary: yeast infection??/rbh   Vital Signs:  Patient profile:   40 year old female Height:      67.75 inches Weight:      148.13 pounds BMI:     22.77 Temp:     98.5 degrees F oral Pulse rate:   64 / minute Pulse rhythm:   regular BP sitting:   110 / 76  (left arm) Cuff size:   regular  Vitals Entered By: Linde Gillis CMA Duncan Dull) (May 30, 2010 3:11 PM) CC: ? yeast infection   History of Present Illness: Has had 3 episodes of yeast infection over summer. LAst  wet prep positive.Marland KitchenMarland Kitchen7/15... improved with diflucan. Took additional diflucan on 7/25.  Vaginal itching, white clear discharge. no labial swelling or redness as she usually gets.  No douche. No topical meds. Taking azo yeast.   Problems Prior to Update: 1)  Candidiasis of Vulva and Vagina  (ICD-112.1) 2)  Unspecified Vaginitis and Vulvovaginitis  (ICD-616.10) 3)  Acute Maxillary Sinusitis  (ICD-461.0) 4)  Screening For Lipoid Disorders  (ICD-V77.91) 5)  Endometriosis, Ovary  (ICD-617.1) 6)  Fh of Thyroid Cancer, Hx of  (ICD-V10.87) 7)  Gerd  (ICD-530.81) 8)  Common Migraine  (ICD-346.10) 9)  Allergic Rhinitis  (ICD-477.9) 10)  Eczema, Atopic  (ICD-691.8) 11)  Tinnitus, Chronic, Left  (ICD-388.30)  Current Medications (verified): 1)  Nasonex 50 Mcg/act  Susp (Mometasone Furoate) .... 2 Sprays Per Nostril Daily 2)  Zyrtec-D Allergy & Congestion 5-120 Mg  Tb12 (Cetirizine-Pseudoephedrine) .... Daily As Needed 3)  Multivitamins   Tabs (Multiple Vitamin) .... Take 1 Tablet By Mouth Once A Day 4)  Vitamin B Complex .[name].. 2 Daily 5)  Omega 3 .... 2 Daily 6)  5 Htp .... Daily 7)  Sumatriptan Succinate 100 Mg Tabs (Sumatriptan Succinate) .[name].. 1 Tab By Mouth As Needed Migraine, May Repeat X 1  in 2 Hours If Not Relieved.  Allergies: 1)  ! Sulfa 2)  ! Penicillin  Past History:  Past medical, surgical, family and social histories (including risk factors) reviewed, and no changes noted (except as noted  below).  Past Medical History: Reviewed history from 02/03/2007 and no changes required. Allergic rhinitis GERD  Past Surgical History: Reviewed history from 02/03/2007 and no changes required. Denies surgical history  Family History: Reviewed history from 02/03/2007 and no changes required. father: age 10 healthy mother age 61 thyroid cancer brother: healthy MGM; DM MGF: DM PGF: CABG age 29  Social History: Reviewed history from 02/03/2007 and no changes required. Occupation: stay at home mom, part time administrative work Married Former Smoker x 3 pack year history Alcohol use-yes 1-2 beer per week Drug use-no Regular exercise-yes 5 days/ weeks cardio 45 min. Diet:  3 meals, fruit/veggies, no soda, lots of water  Physical Exam  General:  Well-developed,well-nourished,in no acute distress; alert,appropriate and cooperative throughout examination Mouth:  MMM Abdomen:  Bowel sounds positive,abdomen soft and non-tender without masses, organomegaly or hernias noted. Genitalia:  normal introitus and vaginal discharge, thin white, vaginal erythema   Impression & Recommendations:  Problem # 1:  UNSPECIFIED VAGINITIS AND VULVOVAGINITIS (ICD-616.10) Bacterial infection...treat with metronidazole. Call if symtpoms not imrppving as expected.  Her updated medication list for this problem includes:    Metronidazole 500 Mg Tabs (Metronidazole) .[name]... 1 tab by mouth two times a day x 7 days  Orders: Wet Prep ([id number])  Complete Medication List: 1)  Nasonex 50 Mcg/act Susp (Mometasone furoate) .... 2 sprays per nostril daily 2)  Zyrtec-d  Allergy & Congestion 5-120 Mg Tb12 (Cetirizine-pseudoephedrine) .... Daily as needed 3)  Multivitamins Tabs (Multiple vitamin) .... Take 1 tablet by mouth once a day 4)  Vitamin B Complex  .[name].. 2 daily 5)  Omega 3  .... 2 daily 6)  5 Htp  .... Daily 7)  Sumatriptan Succinate 100 Mg Tabs (Sumatriptan succinate) .[name].. 1 tab by mouth as needed  migraine, may repeat x 1  in 2 hours if not relieved. 8)  Metronidazole 500 Mg Tabs (Metronidazole) .[name].. 1 tab by mouth two times a day x 7 days Prescriptions: METRONIDAZOLE 500 MG TABS (METRONIDAZOLE) 1 tab by mouth two times a day x 7 days  #14 x 0   Entered and Authorized by:   Kerby Nora MD   Signed by:   Kerby Nora MD on 05/30/2010   Method used:   Electronically to        [facility]* (retail)       [address]       [place], [place]  [address]       Ph: [phone]       Fax: [phone]   RxID:   [id number]   Current Allergies (reviewed today): ! SULFA ! PENICILLIN  Laboratory Results    Wet Mount/KOH Source: vag Clue cells/hpf amny Yeast/hpf none  KOH Negative Trichomonas/hpf negative

## 2010-11-27 NOTE — Progress Notes (Signed)
Summary: ?sinus infection  Phone Note Call from Patient Call back at [phone]   Caller: Patient Call For: Kerby Nora MD Summary of Call: Pt knows Dr Ermalene Searing is not here today but wanted note to go to her. Pt saw Dr. Ermalene Searing on 11/02/09 and pt finished antibiotic on 11/13/09. Pt's ears are 100% better and sinus seemed better while on antibiotic, mucus from nose became clear but now pt has h/a, teeth hurt, pressure in face, dry cough, when blows nose mucus is green and no fever. Pt is still doing nasal saline irrigations,taking mucinex,Nasonex 2 sprays each nostril at night. Pt does not want to go to ENT and would like Dr. Ermalene Searing to advise. Pt uses [facility] [phone] if needed. Please advise.  Initial call taken by: Lewanda Rife LPN,  November 23, 2009 10:05 AM  Follow-up for Phone Call        Sounds like partially treated sinusitis. Will treat with second course of antibitoics...broader antibiotic. Continue other measures as well.  Follow-up by: Kerby Nora MD,  November 23, 2009 11:23 AM  Additional Follow-up for Phone Call Additional follow up Details #1::        patient advised.Consuello Masse CMA  Additional Follow-up by: Benny Lennert CMA Duncan Dull),  November 23, 2009 11:35 AM    New/Updated Medications: LEVAQUIN 750 MG TABS (LEVOFLOXACIN) 1 tab by mouth daily x 10 days Prescriptions: LEVAQUIN 750 MG TABS (LEVOFLOXACIN) 1 tab by mouth daily x 10 days  #10 x 0   Entered and Authorized by:   Kerby Nora MD   Signed by:   Kerby Nora MD on 11/23/2009   Method used:   Electronically to        [facility]* (retail)       [address]       [place], [place]  [address]       Ph: [phone]       Fax: [phone]   RxID:   [id number]

## 2010-11-27 NOTE — Progress Notes (Signed)
Summary: wants to be seen   Phone Note Call from Patient Call back at [phone]   Caller: Patient Call For: Kerby Nora MD Summary of Call: Patient says that she has been feeling sick for the past three days. She has been running temp of 100.6, cough, congestion. She wanted to schedule app. with Dr. Ermalene Searing today or tomorrow, but there are no app available. I offered her the [facility] office phone number to schedule with them or the Sat clinic. She said that she would prefer to see you if there is any way to be fit into the schedule. Please advise.  Initial call taken by: Melody Comas,  December 14, 2009 4:36 PM  Follow-up for Phone Call        i can see her tomorrow at 3:45 or Dr. Ermalene Searing if she wants to work her in. Follow-up by: Hannah Beat MD,  December 14, 2009 5:08 PM  Additional Follow-up for Phone Call Additional follow up Details #1::        Patient had to be seen this morning double booked Additional Follow-up by: Benny Lennert CMA Duncan Dull),  December 15, 2009 7:38 AM

## 2010-11-27 NOTE — Assessment & Plan Note (Signed)
Summary: VAGINAL INFECTION / LFW   Vital Signs:  Patient profile:   40 year old female Height:      67.75 inches Weight:      146 pounds BMI:     22.44 Temp:     98.7 degrees F oral Pulse rate:   76 / minute Pulse rhythm:   regular BP sitting:   108 / 80  (left arm) Cuff size:   regular  Vitals Entered By: Linde Gillis CMA Duncan Dull) (May 09, 2010 12:24 PM) CC: vaginal infection   History of Present Illness: 40 yo with h/o recurrent vaginal yeast infections here for ?yeast infection.  Vulvular itching, irritation and thick vaginal discharge. No odor, fevers, dysuria or abdominal pain.  Has been taking monistat otc with some relief of symptoms.   Allergies: 1)  ! Sulfa 2)  ! Penicillin  Review of Systems      See HPI General:  Denies fever. GU:  Complains of discharge; denies abnormal vaginal bleeding and dysuria.  Physical Exam  General:  Well-developed,well-nourished,in no acute distress; alert,appropriate and cooperative throughout examination Genitalia:  normal introitus and no external lesions.   small amount of vaginal discharge- thick, non malodorous. mild vulvular erythema. Psych:  Cognition and judgment appear intact. Alert and cooperative with normal attention span and concentration. No apparent delusions, illusions, hallucinations   Impression & Recommendations:  Problem # 1:  CANDIDIASIS OF VULVA AND VAGINA (ICD-112.1) Assessment New Will treatwith Diflucan by mouth x 1. Advised calling us back if symptoms deteriorate or do not improve given her h/o recurrent infections. The following medications were removed from the medication list:    Fluconazole 150 Mg Tabs (Fluconazole) .[name]... 1 tab by mouth x 1 Her updated medication list for this problem includes:    Diflucan 150 Mg Tabs (Fluconazole) ..... Once daily  Orders: Wet Prep ([id number])  Complete Medication List: 1)  Nasonex 50 Mcg/act Susp (Mometasone furoate) .... 2 sprays per nostril daily 2)   Zyrtec-d Allergy & Congestion 5-120 Mg Tb12 (Cetirizine-pseudoephedrine) .... Daily as needed 3)  Multivitamins Tabs (Multiple vitamin) .... Take 1 tablet by mouth once a day 4)  Vitamin B Complex  .[name].. 2 daily 5)  Omega 3  .... 2 daily 6)  5 Htp  .... Daily 7)  Sumatriptan Succinate 100 Mg Tabs (Sumatriptan succinate) .[name].. 1 tab by mouth as needed migraine, may repeat x 1  in 2 hours if not relieved. 8)  Diflucan 150 Mg Tabs (Fluconazole) .... Once daily Prescriptions: DIFLUCAN 150 MG TABS (FLUCONAZOLE) once daily  #1 x 0   Entered and Authorized by:   Ruthe Mannan MD   Signed by:   Ruthe Mannan MD on 05/09/2010   Method used:   Print then Give to Patient   RxID:   [id number]   Current Allergies (reviewed today): ! SULFA ! PENICILLIN  Laboratory Results    Wet Mount/KOH Source: vaginal Bacteria/hpf rare Clue cells/hpf none  Negative whiff Yeast/hpf few

## 2010-11-27 NOTE — Letter (Signed)
Summary: Patient Notice- Colon Biospy Results  [facility]  [address] [place], [place] [address]   Phone: [phone]  Fax: [phone]        July 13, 2010 MRN: [id number]    [facility] [address] [place], [place]  [address]    Dear Ms. Lipford,  I am pleased to inform you that the biopsies taken during your recent colonoscopy did not show any problem other than inflammation, from diverticulitis, as expected.  Additional information/recommendations:    __Continue with the treatment plan as outlined on the day of your      exam.  __You should have a repeat colonoscopy examination for routine screening at age 53.    Please call us if you are having persistent problems or have questions about your condition that have not been fully answered at this time.  Sincerely,  Hilarie Fredrickson MD   This letter has been electronically signed by your physician.  Appended Document: Patient Notice- Colon Biospy Results letter mailed

## 2010-11-27 NOTE — Letter (Signed)
Summary: [facility] Instructions  [facility]  [address] [place], [place] [address]   Phone: [phone]  Fax: [phone]       Anna [name]    1970/12/20    MRN: [id number]        Procedure Day /Date: 07-10-2010     Arrival Time: 2:30 PM      Procedure Time: 3:30 PM     Location of Procedure:                    X    [facility] (4th Floor)                         PREPARATION FOR COLONOSCOPY WITH MOVIPREP    THE DAY BEFORE YOUR PROCEDURE         DATE: 07-09-10  DAY: Monday  1.  Drink clear liquids the entire day-NO SOLID FOOD  2.  Do not drink anything colored red or purple.  Avoid juices with pulp.  No orange juice.  3.  Drink at least 64 oz. (8 glasses) of fluid/clear liquids during the day to prevent dehydration and help the prep work efficiently.  CLEAR LIQUIDS INCLUDE: Water Jello Ice Popsicles Tea (sugar ok, no milk/cream) Powdered fruit flavored drinks Coffee (sugar ok, no milk/cream) Gatorade Juice: apple, white grape, white cranberry  Lemonade Clear bullion, consomm, broth Carbonated beverages (any kind) Strained chicken noodle soup Hard Candy                             4.  In the morning, mix first dose of MoviPrep solution:    Empty 1 Pouch A and 1 Pouch B into the disposable container    Add lukewarm drinking water to the top line of the container. Mix to dissolve    Refrigerate (mixed solution should be used within 24 hrs)  5.  Begin drinking the prep at 5:00 p.m. The MoviPrep container is divided by 4 marks.   Every 15 minutes drink the solution down to the next mark (approximately 8 oz) until the full liter is complete.   6.  Follow completed prep with 16 oz of clear liquid of your choice (Nothing red or purple).  Continue to drink clear liquids until bedtime.  7.  Before going to bed, mix second dose of MoviPrep solution:    Empty 1 Pouch A and 1 Pouch B into the disposable container    Add lukewarm  drinking water to the top line of the container. Mix to dissolve    Refrigerate  THE DAY OF YOUR PROCEDURE      DATE: 07-10-10 DAY: Tuesday  Beginning at 10:30 AM  (5 hours before procedure):         1. Every 15 minutes, drink the solution down to the next mark (approx 8 oz) until the full liter is complete.  2. Follow completed prep with 16 oz. of clear liquid of your choice.    3. You may drink clear liquids until 1:30 PM _ (2 HOURS BEFORE PROCEDURE).   MEDICATION INSTRUCTIONS  Unless otherwise instructed, you should take regular prescription medications with a small sip of water   as early as possible the morning of your procedure.       OTHER INSTRUCTIONS  You will need a responsible adult at least 38 years of age to accompany you and drive you home.  This person must remain in the waiting room during your procedure.  Wear loose fitting clothing that is easily removed.  Leave jewelry and other valuables at home.  However, you may wish to bring a book to read or  an iPod/MP3 player to listen to music as you wait for your procedure to start.  Remove all body piercing jewelry and leave at home.  Total time from sign-in until discharge is approximately 2-3 hours.  You should go home directly after your procedure and rest.  You can resume normal activities the  day after your procedure.  The day of your procedure you should not:   Drive   Make legal decisions   Operate machinery   Drink alcohol   Return to work  You will receive specific instructions about eating, activities and medications before you leave.    The above instructions have been reviewed and explained to me by   _______________________    I fully understand and can verbalize these instructions _____________________________ Date _________

## 2010-11-27 NOTE — Progress Notes (Signed)
Summary: Not feeling any better  Phone Note Call from Patient Call back at Home Phone [phone]   Caller: Patient Call For: Kerby Nora MD Summary of Call: Patient called to see what she should do, been on antibiotics since Friday 12/15/2009 and she feels she isn't getting any better.  No wheezing even though she says she does get tired when she is active.  Advised her to give the antibiotics a few more days, it actually stays in your system longer, about 7-10 days.  Advised that if she develops new symptoms or worsen to give [place] a call back.   Initial call taken by: Linde Gillis CMA Duncan Dull),  December 18, 2009 4:15 PM

## 2010-11-27 NOTE — Assessment & Plan Note (Signed)
Summary: EAR/CLE   Vital Signs:  Patient profile:   40 year old female Height:      67.75 inches Weight:      146.6 pounds BMI:     22.54 Temp:     98.2 degrees F oral Pulse rate:   76 / minute Pulse rhythm:   regular BP sitting:   94 / 62  (left arm) Cuff size:   regular  Vitals Entered By: Benny Lennert CMA Duncan Dull) (November 02, 2009 10:54 AM)  History of Present Illness: Chief complaint right ear pain,sinus congestion  Acute Visit History:      The patient complains of earache and sinus problems.  These symptoms began 1 week ago.  She denies chest pain, cough, fever, and sore throat.  Other comments include: Has been feeling fluid in right ear for months..using mucinex intermittantly. Sudafed daily.  Nasonex only 1 spray daily Does have a lot of PND.  [name]        The earache is located on the right side.        She complains of sinus pressure.        Problems Prior to Update: 1)  Screening For Lipoid Disorders  (ICD-V77.91) 2)  Acute Swimmers Ear  (ICD-380.12) 3)  Fatigue  (ICD-780.79) 4)  Endometriosis, Ovary  (ICD-617.1) 5)  Fh of Thyroid Cancer, Hx of  (ICD-V10.87) 6)  Gerd  (ICD-530.81) 7)  Common Migraine  (ICD-346.10) 8)  Allergic Rhinitis  (ICD-477.9) 9)  Eczema, Atopic  (ICD-691.8) 10)  Tinnitus, Chronic, Left  (ICD-388.30)  Current Medications (verified): 1)  Nasonex 50 Mcg/act  Susp (Mometasone Furoate) .... 2 Sprays Per Nostril Daily 2)  Zyrtec-D Allergy & Congestion 5-120 Mg  Tb12 (Cetirizine-Pseudoephedrine) .... Daily As Needed 3)  Multivitamins   Tabs (Multiple Vitamin) .... Take 1 Tablet By Mouth Once A Day 4)  Vitamin B Complex .[name].. 2 Daily 5)  Omega 3 .... 2 Daily 6)  5 Htp .... Daily 7)  Sumatriptan Succinate 100 Mg Tabs (Sumatriptan Succinate) .[name].. 1 Tab By Mouth As Needed Migraine, May Repeat X 1  in 2 Hours If Not Relieved. 8)  Clarithromycin 500 Mg Tabs (Clarithromycin) .[name].. 1 Tab By Mouth Two Times A Day  X 10 Days  Allergies: 1)  !  Sulfa 2)  ! Penicillin  Past History:  Past medical, surgical, family and social histories (including risk factors) reviewed, and no changes noted (except as noted below).  Past Medical History: Reviewed history from 02/03/2007 and no changes required. Allergic rhinitis GERD  Past Surgical History: Reviewed history from 02/03/2007 and no changes required. Denies surgical history  Family History: Reviewed history from 02/03/2007 and no changes required. father: age 66 healthy mother age 15 thyroid cancer brother: healthy MGM; DM MGF: DM PGF: CABG age 63  Social History: Reviewed history from 02/03/2007 and no changes required. Occupation: stay at home mom, part time administrative work Married Former Smoker x 3 pack year history Alcohol use-yes 1-2 beer per week Drug use-no Regular exercise-yes 5 days/ weeks cardio 45 min. Diet:  3 meals, fruit/veggies, no soda, lots of water  Review of Systems General:  Denies fatigue, fever, and loss of appetite. CV:  Denies chest pain or discomfort. Resp:  Denies shortness of breath.  Physical Exam  General:  Well-developed,well-nourished,in no acute distress; alert,appropriate and cooperative throughout examination Head:  B maxiallary pain Ears:  External ear exam shows no significant lesions or deformities.  Otoscopic examination reveals clear canals, tympanic membranes  are intact bilaterally without bulging, retraction, inflammation or discharge. Hearing is grossly normal bilaterally. Nose:  nasal turbinate pallor Mouth:  MMM Neck:  no carotid bruit or thyromegaly no cervical or supraclavicular lymphadenopathy  Lungs:  Normal respiratory effort, chest expands symmetrically. Lungs are clear to auscultation, no crackles or wheezes. Heart:  Normal rate and regular rhythm. S1 and S2 normal without gallop, murmur, click, rub or other extra sounds.   Impression & Recommendations:  Problem # 1:  ACUTE MAXILLARY SINUSITIS  (ICD-461.0) Worsened symptoms on top of chronic feeling of fluid in ear and PND.  Will treat  with antibitoic, but step up chronic allergy treatment as below.  Her updated medication list for this problem includes:    Nasonex 50 Mcg/act Susp (Mometasone furoate) .[name]... 2 sprays per nostril daily    Zyrtec-d Allergy & Congestion 5-120 Mg Tb12 (Cetirizine-pseudoephedrine) .[name]... Daily as needed    Clarithromycin 500 Mg Tabs (Clarithromycin) .[name]... 1 tab by mouth two times a day  x 10 days  Problem # 2:  ALLERGIC RHINITIS (ICD-477.9) Increase nasonex to 2 sprays per nostril daily. Increase mucinex, continue decngestant, restart nasal saline irigation.Call if not improving for ENT referrla.  Her updated medication list for this problem includes:    Nasonex 50 Mcg/act Susp (Mometasone furoate) .[name]... 2 sprays per nostril daily  Complete Medication List: 1)  Nasonex 50 Mcg/act Susp (Mometasone furoate) .... 2 sprays per nostril daily 2)  Zyrtec-d Allergy & Congestion 5-120 Mg Tb12 (Cetirizine-pseudoephedrine) .... Daily as needed 3)  Multivitamins Tabs (Multiple vitamin) .... Take 1 tablet by mouth once a day 4)  Vitamin B Complex  .[name].. 2 daily 5)  Omega 3  .... 2 daily 6)  5 Htp  .... Daily 7)  Sumatriptan Succinate 100 Mg Tabs (Sumatriptan succinate) .[name].. 1 tab by mouth as needed migraine, may repeat x 1  in 2 hours if not relieved. 8)  Clarithromycin 500 Mg Tabs (Clarithromycin) .[name].. 1 tab by mouth two times a day  x 10 days  Patient Instructions: 1)  Increase mucinex. 2)  Continue decongestant. 3)  Increase nasal steroid. 4)  Start antibitoic course. 5)  Restart nasal saline irrigation. If not improving in 10-14 days call for ENT referral.  Prescriptions: CLARITHROMYCIN 500 MG TABS (CLARITHROMYCIN) 1 tab by mouth two times a day  x 10 days  #20 x 0   Entered and Authorized by:   Kerby Nora MD   Signed by:   Kerby Nora MD on 11/02/2009   Method used:   Electronically to        [facility]* (retail)       [address]       [place], [place]  [address]       Ph: [phone]       Fax: [phone]   RxID:   [id number]   Current Allergies (reviewed today): ! SULFA ! PENICILLIN

## 2010-11-27 NOTE — Progress Notes (Signed)
Summary: nasonex  Phone Note Refill Request Call back at Home Phone [phone] Message from:  Patient on April 24, 2010 8:46 AM  Refills Requested: Medication #1:  NASONEX 50 MCG/ACT  SUSP 2 sprays per nostril daily wants this sent to [facility]  Initial call taken by: Melody Comas,  April 24, 2010 8:46 AM    Prescriptions: NASONEX 50 MCG/ACT  SUSP (MOMETASONE FUROATE) 2 sprays per nostril daily  #3 x 3   Entered by:   Benny Lennert CMA (AAMA)   Authorized by:   Kerby Nora MD   Signed by:   Kerby Nora MD on 04/24/2010   Method used:   Faxed to ...       [facility] (mail-order)             , [place]         Ph: [phone]       Fax: [phone]   RxID:   [id number]

## 2010-11-27 NOTE — Assessment & Plan Note (Signed)
Summary: allergies,congestion,sob/alc   Vital Signs:  Patient profile:   40 year old female Height:      67.75 inches Weight:      149 pounds BMI:     22.91 Temp:     98.8 degrees F oral Pulse rate:   80 / minute Pulse rhythm:   regular BP sitting:   110 / 70  (left arm) Cuff size:   regular  Vitals Entered By: Linde Gillis CMA Duncan Dull) (August 30, 2010 2:54 PM) CC: allergies, congestion   History of Present Illness: 40 yo here for allergies and congestion.  Has a h/o seasonal allergies, been worse last couple of weeks. On Nasonex, Zyrtec D. Last few days more short of breath, dry cough. No fevers or chills. No CP.    Current Medications (verified): 1)  Nasonex 50 Mcg/act  Susp (Mometasone Furoate) .... 2 Sprays Per Nostril Daily 2)  Zyrtec-D Allergy & Congestion 5-120 Mg  Tb12 (Cetirizine-Pseudoephedrine) .... Daily As Needed 3)  Multivitamins   Tabs (Multiple Vitamin) .... Take 1 Tablet By Mouth Once A Day 4)  Vitamin B Complex .... Take 2 Tablets By Mouth Once Daily 5)  Omega 3 1200 Mg Caps (Omega-3 Fatty Acids) .... Take 2 Capsules By Mouth Once Daily 6)  5 Htp .... Take 1 Tablet By Mouth Once A Day 7)  Sumatriptan Succinate 100 Mg Tabs (Sumatriptan Succinate) .[name].. 1 Tab By Mouth As Needed Migraine, May Repeat X 1  in 2 Hours If Not Relieved. 8)  Azithromycin 250 Mg  Tabs (Azithromycin) .... 2 By  Mouth Today and Then 1 Daily For 4 Days 9)  Azithromycin 250 Mg  Tabs (Azithromycin) .... 2 By  Mouth Today and Then 1 Daily For 4 Days  Allergies: 1)  ! Sulfa 2)  ! Penicillin  Review of Systems      See HPI General:  Denies fever. ENT:  Complains of postnasal drainage, sinus pressure, and sore throat. Resp:  Complains of cough and shortness of breath; denies sputum productive and wheezing.  Physical Exam  General:  fatigued appearing female in NAD VSS Nose:  boggy turbinates. sinuses NTTP Mouth:  MMM Lungs:  Normal respiratory effort, chest expands  symmetrically.  scant exp wheeze LLLB. No crackles.   Heart:  Normal rate and regular rhythm. S1 and S2 normal without gallop, murmur, click, rub or other extra sounds. Extremities:  no edema Psych:  Cognition and judgment appear intact. Alert and cooperative with normal attention span and concentration. No apparent delusions, illusions, hallucinations   Impression & Recommendations:  Problem # 1:  ALLERGIC RHINITIS (ICD-477.9) Assessment Deteriorated with symptoms and exam consistent with bronchitis. Will change allergy meds- consider allegra D and singulair (see pt instrucitons for details). Fill Zpack if symptoms do not improve over next few days. Also given sample of albuterol inhaler.   Her updated medication list for this problem includes:    Nasonex 50 Mcg/act Susp (Mometasone furoate) .[name]... 2 sprays per nostril daily  Complete Medication List: 1)  Nasonex 50 Mcg/act Susp (Mometasone furoate) .... 2 sprays per nostril daily 2)  Zyrtec-d Allergy & Congestion 5-120 Mg Tb12 (Cetirizine-pseudoephedrine) .... Daily as needed 3)  Multivitamins Tabs (Multiple vitamin) .... Take 1 tablet by mouth once a day 4)  Vitamin B Complex  .... Take 2 tablets by mouth once daily 5)  Omega 3 1200 Mg Caps (Omega-3 fatty acids) .... Take 2 capsules by mouth once daily 6)  5 Htp  .... Take 1  tablet by mouth once a day 7)  Sumatriptan Succinate 100 Mg Tabs (Sumatriptan succinate) .[name].. 1 tab by mouth as needed migraine, may repeat x 1  in 2 hours if not relieved. 8)  Azithromycin 250 Mg Tabs (Azithromycin) .... 2 by  mouth today and then 1 daily for 4 days 9)  Azithromycin 250 Mg Tabs (Azithromycin) .... 2 by  mouth today and then 1 daily for 4 days 10)  Proair Hfa 108 (90 Base) Mcg/act Aers (Albuterol sulfate) .... 2 inh q4h as needed shortness of breath  Patient Instructions: 1)  Try Allegra D instead of Zyrtec. 2)  Add Singulair, try the inhaler- 2 puffs every four hours as needed for shortness of  breath. 3)  Fill your antibiotic if no improvement over the next couple of days. Prescriptions: AZITHROMYCIN 250 MG  TABS (AZITHROMYCIN) 2 by  mouth today and then 1 daily for 4 days  #6 x 0   Entered and Authorized by:   Ruthe Mannan MD   Signed by:   Ruthe Mannan MD on 08/30/2010   Method used:   Print then Give to Patient   RxID:   [id number] AZITHROMYCIN 250 MG  TABS (AZITHROMYCIN) 2 by  mouth today and then 1 daily for 4 days  #6 x 0   Entered and Authorized by:   Ruthe Mannan MD   Signed by:   Ruthe Mannan MD on 08/30/2010   Method used:   Print then Give to Patient   RxID:   [id number]    Orders Added: 1)  Est. Patient Level III [84696]    Current Allergies (reviewed today): ! SULFA ! PENICILLIN

## 2010-11-27 NOTE — Progress Notes (Signed)
Summary: yeast infection  Phone Note Call from Patient Call back at Home Phone [phone]   Caller: Patient Summary of Call: Patient has finished her antibiotic now and she has a yeast infection. She is asking if she can get a rx called in to  [place].  Initial call taken by: Melody Comas,  June 12, 2010 2:53 PM  Follow-up for Phone Call        Patient notified as instructed Rx sent to [facility]. Follow-up by: Linde Gillis CMA Duncan Dull),  June 12, 2010 3:17 PM    New/Updated Medications: FLUCONAZOLE 150 MG TABS (FLUCONAZOLE) 1 tab by mouth x1 Prescriptions: FLUCONAZOLE 150 MG TABS (FLUCONAZOLE) 1 tab by mouth x1  #1 x 1   Entered and Authorized by:   Kerby Nora MD   Signed by:   Kerby Nora MD on 06/12/2010   Method used:   Electronically to        [facility]* (retail)       [address]       [place], [place]  [address]       Ph: [phone]       Fax: [phone]   RxID:   [id number]

## 2010-11-27 NOTE — Assessment & Plan Note (Signed)
Summary: Followup-colonoscopy (diverticulitis)   History of Present Illness Visit Type: Follow-up Visit Primary GI MD: Yancey Flemings MD Primary Provider: Kerby Nora, MD Requesting Provider: n/a Chief Complaint: Patient here for f/u after colonoscopy. Her only complaint is of some LLQ abdominal pain at times. She denies any other symptoms. History of Present Illness:   40 year old who was seen 4 weeks ago with abdominal pain and abnormal CT scan. She subsequently underwent colonoscopy on July 10, 2010. Inflammatory changes on the CT scan were secondary to a sending diverticulitis. Biopsies consistent with the same. She was reassured and told to complete her course of antibiotics. She subtotally contacted the office complaining of some residual discomfort for which a second course of antibiotics were given. Because of complaints of low level left lower quadrant discomfort a followup CT scan was performed July 20, 2010. This showed interval clearing of the right-sided inflammatory changes of the colon. She has had chronic recurrent left lower quadrant pain for years. This is felt secondary to adhesions. She presents today for followup. Still complaining of some intermittent left lower quadrant discomfort at times. She did have transient loose stools which have resolved. No other issues or complaints. She has lots of questions on diverticular disease.   GI Review of Systems    Reports abdominal pain and  bloating.     Location of  Abdominal pain: LLQ.    Denies acid reflux, belching, chest pain, dysphagia with liquids, dysphagia with solids, heartburn, loss of appetite, nausea, vomiting, vomiting blood, weight loss, and  weight gain.      Reports diverticulosis.     Denies anal fissure, black tarry stools, change in bowel habit, constipation, diarrhea, fecal incontinence, heme positive stool, hemorrhoids, irritable bowel syndrome, jaundice, light color stool, liver problems, rectal bleeding,  and  rectal pain.    Current Medications (verified): 1)  Nasonex 50 Mcg/act  Susp (Mometasone Furoate) .... 2 Sprays Per Nostril Daily 2)  Zyrtec-D Allergy & Congestion 5-120 Mg  Tb12 (Cetirizine-Pseudoephedrine) .... Daily As Needed 3)  Multivitamins   Tabs (Multiple Vitamin) .... Take 1 Tablet By Mouth Once A Day 4)  Vitamin B Complex .... Take 2 Tablets By Mouth Once Daily 5)  Omega 3 1200 Mg Caps (Omega-3 Fatty Acids) .... Take 2 Capsules By Mouth Once Daily 6)  5 Htp .... Take 1 Tablet By Mouth Once A Day 7)  Sumatriptan Succinate 100 Mg Tabs (Sumatriptan Succinate) .[name].. 1 Tab By Mouth As Needed Migraine, May Repeat X 1  in 2 Hours If Not Relieved.  Allergies (verified): 1)  ! Sulfa 2)  ! Penicillin  Past History:  Past Medical History: Reviewed history from 07/09/2010 and no changes required. MYALGIA (ICD-729.1) ABDOMINAL PAIN, LOWER (ICD-789.09) CANDIDIASIS OF VULVA AND VAGINA (ICD-112.1) UNSPECIFIED VAGINITIS AND VULVOVAGINITIS (ICD-616.10) ACUTE MAXILLARY SINUSITIS (ICD-461.0) SCREENING FOR LIPOID DISORDERS (ICD-V77.91) ENDOMETRIOSIS, OVARY (ICD-617.1) Family Hx of THYROID CANCER, HX OF (ICD-V10.87) GERD (ICD-530.81) COMMON MIGRAINE (ICD-346.10) ALLERGIC RHINITIS (ICD-477.9) ECZEMA, ATOPIC (ICD-691.8) TINNITUS, CHRONIC, LEFT (ICD-388.30)  Past Surgical History: Laparoscopic Ovarian Cystectomy   Family History: Reviewed history from 07/09/2010 and no changes required. father: age 37 healthy mother age 41 thyroid cancer brother: healthy MGM; DM MGF: DM PGF: CABG age 36 Family History of Colon Polyps:Father  No FH of Colon Cancer: Family History of Irritable Bowel Syndrome:Mother   Social History: Occupation: stay at home mom, part time administrative work Married Childern Former Smoker x 3 pack year history Alcohol use-yes 1-2 beer per week Drug use-no Regular  exercise-yes 3 days/ weeks cardio 45 min. Diet:  3 meals, fruit/veggies, no soda, lots of  water  Review of Systems  The patient denies allergy/sinus, anemia, anxiety-new, arthritis/joint pain, back pain, blood in urine, breast changes/lumps, change in vision, confusion, cough, coughing up blood, depression-new, fainting, fatigue, fever, headaches-new, hearing problems, heart murmur, heart rhythm changes, itching, menstrual pain, muscle pains/cramps, night sweats, nosebleeds, pregnancy symptoms, shortness of breath, skin rash, sleeping problems, sore throat, swelling of feet/legs, swollen lymph glands, thirst - excessive , urination - excessive , urination changes/pain, urine leakage, vision changes, and voice change.    Vital Signs:  Patient profile:   40 year old female Height:      67.75 inches Weight:      146 pounds BMI:     22.44 BSA:     1.78 Pulse rate:   84 / minute Pulse rhythm:   regular BP sitting:   98 / 68  (right arm)  Vitals Entered By: Lamona Curl CMA Duncan Dull) (August 08, 2010 8:55 AM)  Physical Exam  General:  Well developed, well nourished, no acute distress. Head:  Normocephalic and atraumatic. Eyes:  PERRLA, no icterus. Lungs:  Clear throughout to auscultation. Heart:  Regular rate and rhythm; no murmurs, rubs,  or bruits. Abdomen:  Soft,  and nondistended. Mild left lower discomfort with palpation. No masses, hepatosplenomegaly or hernias noted. Normal bowel sounds. Pulses:  Normal pulses noted. Extremities:  no edema Neurologic:  alert and oriented Skin:  no jaundice or rash Psych:  Alert and cooperative. Normal mood and affect.   Impression & Recommendations:  Problem # 1:  DIVERTICULITIS-COLON (ICD-562.11) Ascending diverticulitis. Resolved clinically and radiographically.  Plan: #1. Discussion on diverticular disease for 20 minutes #2. High fiber diet #3. Brochure on diverticular disease provided #4. Follow p.r.n.  Problem # 2:  ABDOMINAL PAIN-LLQ (ICD-789.04) chronic recurrent left lower quadrant discomfort likely secondary to  adhesions or known endometriosis.  Problem # 3:  ENDOMETRIOSIS (ICD-617.9) Assessment: Comment Only  Patient Instructions: 1)  Diverticular Disease brochure given.  2)  Please schedule a follow-up appointment as needed.  3)  The medication list was reviewed and reconciled.  All changed / newly prescribed medications were explained.  A complete medication list was provided to the patient / caregiver. 4)  Copy sent to : Kerby Nora, M.D.

## 2010-11-27 NOTE — Assessment & Plan Note (Signed)
Summary: FLU SHOT/CLE  Nurse Visit   Allergies: 1)  ! Sulfa 2)  ! Penicillin  Immunizations Administered:  Influenza Vaccine # 1:    Vaccine Type: Fluvax 3+    Site: right deltoid    Mfr: GlaxoSmithKline    Dose: 0.5 ml    Route: IM    Given by: Mervin Hack CMA (AAMA)    Exp. Date: 04/27/2011    Lot #: [id number]    VIS given: 05/22/10 version given August 09, 2010.  Flu Vaccine Consent Questions:    Do you have a history of severe allergic reactions to this vaccine? no    Any prior history of allergic reactions to egg and/or gelatin? no    Do you have a sensitivity to the preservative Thimersol? no    Do you have a past history of Guillan-Barre Syndrome? no    Do you currently have an acute febrile illness? no    Have you ever had a severe reaction to latex? no    Vaccine information given and explained to patient? yes    Are you currently pregnant? no  Orders Added: 1)  Flu Vaccine 60yrs + [90658] 2)  Admin 1st Vaccine [56213]

## 2010-11-27 NOTE — Progress Notes (Signed)
Summary: yeast infection   Phone Note Call from Patient Call back at Home Phone [phone]   Caller: Patient Call For: Kerby Nora MD Summary of Call: Patient was in on 11-3 and was given antibiotic, she says that she now has a yeast infection and is asking if she could get something called in to [facility].  Initial call taken by: Melody Comas,  September 06, 2010 5:08 PM  Follow-up for Phone Call        Patient notified of rx.  Follow-up by: Melody Comas,  September 07, 2010 8:33 AM    New/Updated Medications: FLUCONAZOLE 150 MG TABS (FLUCONAZOLE) 1 tab by mouth x 1 Prescriptions: FLUCONAZOLE 150 MG TABS (FLUCONAZOLE) 1 tab by mouth x 1  #1 x 0   Entered and Authorized by:   Kerby Nora MD   Signed by:   Kerby Nora MD on 09/07/2010   Method used:   Electronically to        [facility]* (retail)       [address]       [place], [place]  [address]       Ph: [phone]       Fax: [phone]   RxID:   [id number]

## 2010-11-27 NOTE — Progress Notes (Signed)
Summary: Triage-Abnormal CT  Phone Note From Other Clinic Call back at [phone]   Caller: Shirlee Limerick, scheduler Reason for Call: Schedule Patient Appt Summary of Call: Dr. Ermalene Searing ordered CT scan over the weekend due to abd pain and fever... CT abnormal results and Dr. Ermalene Searing thinks may be colon cancer... would like pt to be seen today, no later Initial call taken by: Vallarie Mare,  July 09, 2010 8:12 AM  Follow-up for Phone Call        Pt. will see Willette Cluster NP today at 1:30pm. Shirlee Limerick will advise pt. of med.list/co-pay. Follow-up by: Laureen Ochs LPN,  July 09, 2010 9:17 AM

## 2010-11-27 NOTE — Assessment & Plan Note (Signed)
Summary: SICK/HMW   Vital Signs:  Patient profile:   40 year old female Height:      67.75 inches Weight:      147.2 pounds BMI:     22.63 Temp:     98.5 degrees F oral Pulse rate:   76 / minute Pulse rhythm:   regular BP sitting:   100 / 60  (left arm) Cuff size:   regular  Vitals Entered By: Benny Lennert CMA Duncan Dull) (December 15, 2009 8:47 AM)  History of Present Illness: Chief complaint ? sinus infection  Acute Visit History:      The patient complains of cough, nasal discharge, and sinus problems.  These symptoms began 1 week ago.  She denies earache and fever.  Other comments include: fatigue, joint ache, body ache gradually worsening Chest tightness, green cough discharge son diagnosed with viral infection .        The patient notes shortness of breath.  The character of the cough is described as productive.  There is no history of wheezing associated with her cough.        She complains of sinus pressure.        Problems Prior to Update: 1)  Acute Maxillary Sinusitis  (ICD-461.0) 2)  Screening For Lipoid Disorders  (ICD-V77.91) 3)  Endometriosis, Ovary  (ICD-617.1) 4)  Fh of Thyroid Cancer, Hx of  (ICD-V10.87) 5)  Gerd  (ICD-530.81) 6)  Common Migraine  (ICD-346.10) 7)  Allergic Rhinitis  (ICD-477.9) 8)  Eczema, Atopic  (ICD-691.8) 9)  Tinnitus, Chronic, Left  (ICD-388.30)  Current Medications (verified): 1)  Nasonex 50 Mcg/act  Susp (Mometasone Furoate) .... 2 Sprays Per Nostril Daily 2)  Zyrtec-D Allergy & Congestion 5-120 Mg  Tb12 (Cetirizine-Pseudoephedrine) .... Daily As Needed 3)  Multivitamins   Tabs (Multiple Vitamin) .... Take 1 Tablet By Mouth Once A Day 4)  Vitamin B Complex .[name].. 2 Daily 5)  Omega 3 .... 2 Daily 6)  5 Htp .... Daily 7)  Sumatriptan Succinate 100 Mg Tabs (Sumatriptan Succinate) .[name].. 1 Tab By Mouth As Needed Migraine, May Repeat X 1  in 2 Hours If Not Relieved. 8)  Azithromycin 250 Mg Tabs (Azithromycin) .... 2 Tab By Mouth X 1 Than 1  Tab By Mouth Daily 9)  Fluconazole 150 Mg Tabs (Fluconazole) .[name].. 1 Tab By Mouth X 1  Allergies: 1)  ! Sulfa 2)  ! Penicillin  Past History:  Past medical, surgical, family and social histories (including risk factors) reviewed, and no changes noted (except as noted below).  Past Medical History: Reviewed history from 02/03/2007 and no changes required. Allergic rhinitis GERD  Past Surgical History: Reviewed history from 02/03/2007 and no changes required. Denies surgical history  Family History: Reviewed history from 02/03/2007 and no changes required. father: age 71 healthy mother age 45 thyroid cancer brother: healthy MGM; DM MGF: DM PGF: CABG age 65  Social History: Reviewed history from 02/03/2007 and no changes required. Occupation: stay at home mom, part time administrative work Married Former Smoker x 3 pack year history Alcohol use-yes 1-2 beer per week Drug use-no Regular exercise-yes 5 days/ weeks cardio 45 min. Diet:  3 meals, fruit/veggies, no soda, lots of water  Review of Systems General:  Complains of fatigue; denies fever. GI:  Denies abdominal pain.  Physical Exam  General:  Well-developed,well-nourished,in no acute distress; alert,appropriate and cooperative throughout examination Head:  mild B ttp maxillary sinuses Ears:  clear fluid B TMs Nose:  nasal discharge clear Mouth:  MMM Neck:  no carotid bruit or thyromegaly no cervical or supraclavicular lymphadenopathy  Lungs:  Normal respiratory effort, chest expands symmetrically. Lungs are clear to auscultation, no crackles or wheezes. Heart:  Normal rate and regular rhythm. S1 and S2 normal without gallop, murmur, click, rub or other extra sounds.   Impression & Recommendations:  Problem # 1:  BRONCHITIS- ACUTE (ICD-466.0) Given worsening symptoms > than1 week..will treat with azithromycin.  Add nasal discharge and mucinex. Call if not improving as expected.  The following medications were  removed from the medication list:    Levaquin 750 Mg Tabs (Levofloxacin) .[name]... 1 tab by mouth daily x 10 days Her updated medication list for this problem includes:    Zyrtec-d Allergy & Congestion 5-120 Mg Tb12 (Cetirizine-pseudoephedrine) .[name]... Daily as needed    Azithromycin 250 Mg Tabs (Azithromycin) .[name]... 2 tab by mouth x 1 than 1 tab by mouth daily  Complete Medication List: 1)  Nasonex 50 Mcg/act Susp (Mometasone furoate) .... 2 sprays per nostril daily 2)  Zyrtec-d Allergy & Congestion 5-120 Mg Tb12 (Cetirizine-pseudoephedrine) .... Daily as needed 3)  Multivitamins Tabs (Multiple vitamin) .... Take 1 tablet by mouth once a day 4)  Vitamin B Complex  .[name].. 2 daily 5)  Omega 3  .... 2 daily 6)  5 Htp  .... Daily 7)  Sumatriptan Succinate 100 Mg Tabs (Sumatriptan succinate) .[name].. 1 tab by mouth as needed migraine, may repeat x 1  in 2 hours if not relieved. 8)  Azithromycin 250 Mg Tabs (Azithromycin) .... 2 tab by mouth x 1 than 1 tab by mouth daily 9)  Fluconazole 150 Mg Tabs (Fluconazole) .[name].. 1 tab by mouth x 1 Prescriptions: FLUCONAZOLE 150 MG TABS (FLUCONAZOLE) 1 tab by mouth x 1  #1 x 0   Entered and Authorized by:   Kerby Nora MD   Signed by:   Kerby Nora MD on 12/15/2009   Method used:   Electronically to        [facility]* (retail)       [address]       [place], [place]  [address]       Ph: [phone]       Fax: [phone]   RxID:   [id number] AZITHROMYCIN 250 MG TABS (AZITHROMYCIN) 2 tab by mouth x 1 than 1 tab by mouth daily  #6 x 0   Entered and Authorized by:   Kerby Nora MD   Signed by:   Kerby Nora MD on 12/15/2009   Method used:   Electronically to        [facility]* (retail)       [address]       [place], [place]  [address]       Ph: [phone]       Fax: [phone]   RxID:   [id number]   Current Allergies (reviewed today): ! SULFA ! PENICILLIN

## 2010-11-27 NOTE — Progress Notes (Signed)
Summary: refill request for diflucan  Phone Note Refill Request Message from:  Fax from Pharmacy  Refills Requested: Medication #1:  FLUCONAZOLE 150 MG TABS 1 tab by mouth x 1.   Last Refilled: 01/24/2010 Faxed request from [facility].  Initial call taken by: Lowella Petties CMA,  May 02, 2010 9:17 AM  Follow-up for Phone Call        No refills unless pt with symptoms on recent antibiotics...please call to verify.  Follow-up by: Kerby Nora MD,  May 02, 2010 10:50 AM  Additional Follow-up for Phone Call Additional follow up Details #1::        Left message for patient to call and let us know if on anti biotics recently Additional Follow-up by: Benny Lennert CMA Duncan Dull),  May 02, 2010 11:03 AM

## 2010-11-27 NOTE — Progress Notes (Signed)
Summary: requests diflucan  Phone Note Call from Patient Call back at [phone]   Caller: Patient Call For: Kerby Nora MD Summary of Call: Pt states she has a yeast infection and she is asking that diflucan be called to [facility]. She is going out of town in a couple of days. Initial call taken by: Lowella Petties CMA,  May 08, 2010 8:19 AM  Follow-up for Phone Call        Will given diflucan if recent antibitoics.Marland Kitchenif not needs appt for exam.   See last phone note.  Follow-up by: Kerby Nora MD,  May 08, 2010 11:28 AM  Additional Follow-up for Phone Call Additional follow up Details #1::        yes patient has been on antibiotics  Additional Follow-up by: Benny Lennert CMA Duncan Dull),  May 08, 2010 11:32 AM    New/Updated Medications: FLUCONAZOLE 150 MG TABS (FLUCONAZOLE) 1 tab by mouth x 1 Prescriptions: FLUCONAZOLE 150 MG TABS (FLUCONAZOLE) 1 tab by mouth x 1  #1 x 0   Entered and Authorized by:   Kerby Nora MD   Signed by:   Kerby Nora MD on 05/08/2010   Method used:   Electronically to        [facility]* (retail)       [address]       [place], [place]  [address]       Ph: [phone]       Fax: [phone]   RxID:   [id number]

## 2010-11-27 NOTE — Progress Notes (Signed)
----   Converted from flag ---- ---- 07/10/2010 8:14 AM, Kerby Nora MD wrote: Thanks  ---- 07/09/2010 9:36 AM, Carlton Adam wrote: Dr Ermalene Searing, Jenne will be seen today 07/09/2010 at 1:30pm by P.A. Lucrezia Europe with [facility]. Patient was called and notified of this appt. [place] ------------------------------

## 2010-11-27 NOTE — Miscellaneous (Signed)
Summary: Ct.Order  Clinical Lists Changes  Orders: Added new Referral order of CT Abdomen/Pelvis with Contrast (CT Abd/Pelvis w/con) - Signed

## 2010-11-27 NOTE — Progress Notes (Signed)
Summary: llq. pain  Phone Note Call from Patient Call back at [phone]   Caller: Patient Call For: Dr. Marina Goodell Reason for Call: Talk to Nurse Summary of Call: Having abd pain on the left side, seems to get worse when she stands Initial call taken by: Karna Christmas,  July 18, 2010 8:10 AM  Follow-up for Phone Call        Started second course of antibiotics on Monday .Still has llq. pain that when she is completely at rest pain goes away or is a 2-3 but with activity pain level becomes about a 5. No fever. Having soft stools. No nausea or vomiting. Follow-up by: Teryl Lucy RN,  July 18, 2010 8:24 AM  Additional Follow-up for Phone Call Additional follow up Details #1::        Repeat contrast enhanced CT abdomen and pelvis "follow up diverticulitis w/ ongoing pain" Additional Follow-up by: Hilarie Fredrickson MD,  July 18, 2010 8:30 AM    Additional Follow-up for Phone Call Additional follow up Details #2::     Ct. ordered for Friday at [facility] at 10 am.Pt.will pick up contrast and instructions. Follow-up by: Teryl Lucy RN,  July 18, 2010 9:05 AM

## 2010-11-30 ENCOUNTER — Ambulatory Visit (INDEPENDENT_AMBULATORY_CARE_PROVIDER_SITE_OTHER): Payer: 59 | Admitting: Family Medicine

## 2010-11-30 ENCOUNTER — Encounter: Payer: Self-pay | Admitting: Family Medicine

## 2010-11-30 DIAGNOSIS — J0100 Acute maxillary sinusitis, unspecified: Secondary | ICD-10-CM

## 2010-12-05 NOTE — Assessment & Plan Note (Signed)
Summary: ?SINUS INFECTION/CLE  [facility]   Vital Signs:  Patient profile:   40 year old female Weight:      153.25 pounds Temp:     98.6 degrees F oral Pulse rate:   72 / minute Pulse rhythm:   regular BP sitting:   110 / 70  (left arm) Cuff size:   regular  Vitals Entered By: Selena Batten Dance CMA Duncan Dull) (November 30, 2010 3:49 PM) CC: ? SInus infection   History of Present Illness: CC: sinus infection?  3rd round of cold/congestion, mucous.  felt well for 3-4 days between illness.  5d h/o sinus congestion but overall going on for 3 wks.  using neti pot which drains yellow mucous.  Taking mucinex and ibuprofen which helps some.  Feels L tonsil swelling and sore throat.  Today feeling very congested, stuck.  + tooth pain and sinus pressure headache.  + chest tightness and pain with deep breath.  No fevers/chills, abd pain, n/v/d, new rashes, myalgias and arthralgias.  Some dry cough.  No smokers at home, no h/o asthma.  + sick contacts (children)  Current Medications (verified): 1)  Nasonex 50 Mcg/act  Susp (Mometasone Furoate) .... 2 Sprays Per Nostril Daily 2)  Zyrtec-D Allergy & Congestion 5-120 Mg  Tb12 (Cetirizine-Pseudoephedrine) .... Daily As Needed 3)  Multivitamins   Tabs (Multiple Vitamin) .... Take 1 Tablet By Mouth Once A Day 4)  Omega 3 1200 Mg Caps (Omega-3 Fatty Acids) .... Take 2 Capsules By Mouth Once Daily 5)  Sumatriptan Succinate 100 Mg Tabs (Sumatriptan Succinate) .[name].. 1 Tab By Mouth As Needed Migraine, May Repeat X 1  in 2 Hours If Not Relieved. 6)  Proair Hfa 108 (90 Base) Mcg/act  Aers (Albuterol Sulfate) .... 2 Inh Q4h As Needed Shortness of Breath 7)  Singulair 10 Mg Tabs (Montelukast Sodium) .[name].. 1 By Mouth Daily 8)  Fluconazole 150 Mg Tabs (Fluconazole) .[name].. 1 Tab By Mouth X 1  Allergies: 1)  ! Sulfa 2)  ! Penicillin  Past History:  Past Medical History: Last updated: 07/09/2010 MYALGIA (ICD-729.1) ABDOMINAL PAIN, LOWER (ICD-789.09) CANDIDIASIS OF VULVA  AND VAGINA (ICD-112.1) UNSPECIFIED VAGINITIS AND VULVOVAGINITIS (ICD-616.10) ACUTE MAXILLARY SINUSITIS (ICD-461.0) SCREENING FOR LIPOID DISORDERS (ICD-V77.91) ENDOMETRIOSIS, OVARY (ICD-617.1) Family Hx of THYROID CANCER, HX OF (ICD-V10.87) GERD (ICD-530.81) COMMON MIGRAINE (ICD-346.10) ALLERGIC RHINITIS (ICD-477.9) ECZEMA, ATOPIC (ICD-691.8) TINNITUS, CHRONIC, LEFT (ICD-388.30)  Social History: Last updated: 08/08/2010 Occupation: stay at home mom, part time administrative work Married Childern Former Smoker x 3 pack year history Alcohol use-yes 1-2 beer per week Drug use-no Regular exercise-yes 3 days/ weeks cardio 45 min. Diet:  3 meals, fruit/veggies, no soda, lots of water  Review of Systems       per HPI  Physical Exam  General:  fatigued appearing female in NAD VSS Head:  B ttp maxillary>frontal sinuses Eyes:  No corneal or conjunctival inflammation noted. EOMI. Perrla.. Ears:  Tms clear bilaterally Nose:  boggy turbinates. Mouth:  MMM, some pharyngeal erythema L>R Neck:   no cervical or supraclavicular lymphadenopathy  Lungs:  Normal respiratory effort, chest expands symmetrically. Lungs are clear to auscultation, no crackles or wheezes. Heart:  Normal rate and regular rhythm. S1 and S2 normal without gallop, murmur, click, rub or other extra sounds. Pulses:  2+ rad pulses Extremities:   no pedal edema   Impression & Recommendations:  Problem # 1:  ACUTE MAXILLARY SINUSITIS (ICD-461.0) Instructed on treatment. Call if symptoms persist or worsen.  allergy to PCN, sulfa.  change INS to flonase, rec stop allegra while with infection.  Her updated medication list for this problem includes:    Flonase 50 Mcg/act Susp (Fluticasone propionate) .[name]... 2 sprays twice daily per nostril    Allegra-d Allergy & Congestion 60-120 Mg Xr12h-tab (Fexofenadine-pseudoephedrine) ..... One for allergies daily    Biaxin 500 Mg Tabs (Clarithromycin) ..... One by mouth two times a  day x 10 days  Complete Medication List: 1)  Flonase 50 Mcg/act Susp (Fluticasone propionate) .... 2 sprays twice daily per nostril 2)  Allegra-d Allergy & Congestion 60-120 Mg Xr12h-tab (Fexofenadine-pseudoephedrine) .... One for allergies daily 3)  Multivitamins Tabs (Multiple vitamin) .... Take 1 tablet by mouth once a day 4)  Omega 3 1200 Mg Caps (Omega-3 fatty acids) .... Take 2 capsules by mouth once daily 5)  Sumatriptan Succinate 100 Mg Tabs (Sumatriptan succinate) .[name].. 1 tab by mouth as needed migraine, may repeat x 1  in 2 hours if not relieved. 6)  Proair Hfa 108 (90 Base) Mcg/act Aers (Albuterol sulfate) .... 2 inh q4h as needed shortness of breath 7)  Singulair 10 Mg Tabs (Montelukast sodium) .[name].. 1 by mouth daily 8)  Fluconazole 150 Mg Tabs (Fluconazole) .[name].. 1 tab by mouth x 1 9)  Biaxin 500 Mg Tabs (Clarithromycin) .... One by mouth two times a day x 10 days  Patient Instructions: 1)  may have sinusitis component. 2)  treat with bactrim twice daily for 10 days. 3)  Push fluids, rest, neti pot, mucinex.  4)  update [place] if not improving as expected or any fevers, worsening cough or trouble breathing. Prescriptions: BIAXIN 500 MG TABS (CLARITHROMYCIN) one by mouth two times a day x 10 days  #20 x 0   Entered and Authorized by:   Eustaquio Boyden  MD   Signed by:   Eustaquio Boyden  MD on 11/30/2010   Method used:   Electronically to        [facility]* (retail)       [address]       [place], [place]  [address]       Ph: [phone]       Fax: [phone]   RxID:   [id number] FLONASE 50 MCG/ACT SUSP (FLUTICASONE PROPIONATE) 2 sprays twice daily per nostril  #1 x 3   Entered and Authorized by:   Eustaquio Boyden  MD   Signed by:   Eustaquio Boyden  MD on 11/30/2010   Method used:   Electronically to        [facility]* (retail)       [address]       [place], [place]  [address]       Ph: [phone]       Fax: [phone]   RxID:    [id number]    Orders Added: 1)  Est. Patient Level III [88416]    Current Allergies (reviewed today): ! SULFA ! PENICILLIN

## 2010-12-10 ENCOUNTER — Telehealth: Payer: Self-pay | Admitting: Family Medicine

## 2010-12-19 NOTE — Progress Notes (Signed)
Summary: pt requests diflucan  Phone Note Call from Patient Call back at [phone]   Caller: Patient Call For: Kerby Nora MD Summary of Call: Pt has recently been on antibiotic and now has a yeast infecton.  She is asking that diflucan be called to [facility]. Initial call taken by: Lowella Petties CMA, AAMA,  December 10, 2010 3:19 PM  Follow-up for Phone Call        Patient notified that rx has been sent to pharmacy. Follow-up by: Sydell Axon LPN,  December 10, 2010 4:35 PM    Prescriptions: FLUCONAZOLE 150 MG TABS (FLUCONAZOLE) 1 tab by mouth x 1  #1 x 0   Entered and Authorized by:   Kerby Nora MD   Signed by:   Kerby Nora MD on 12/10/2010   Method used:   Electronically to        [facility]* (retail)       [address]       [place], [place]  [address]       Ph: [phone]       Fax: [phone]   RxID:   [id number]

## 2010-12-20 ENCOUNTER — Other Ambulatory Visit: Payer: Self-pay | Admitting: Sports Medicine

## 2010-12-20 DIAGNOSIS — M25531 Pain in right wrist: Secondary | ICD-10-CM

## 2010-12-26 ENCOUNTER — Ambulatory Visit
Admission: RE | Admit: 2010-12-26 | Discharge: 2010-12-26 | Disposition: A | Discharge status: Home / Self Care | Payer: 59 | Source: Ambulatory Visit | Attending: Sports Medicine | Admitting: Sports Medicine

## 2010-12-26 DIAGNOSIS — M25531 Pain in right wrist: Secondary | ICD-10-CM

## 2011-01-01 ENCOUNTER — Ambulatory Visit (INDEPENDENT_AMBULATORY_CARE_PROVIDER_SITE_OTHER): Payer: 59 | Admitting: Family Medicine

## 2011-01-01 ENCOUNTER — Encounter: Payer: Self-pay | Admitting: Family Medicine

## 2011-01-01 DIAGNOSIS — R0600 Dyspnea, unspecified: Secondary | ICD-10-CM

## 2011-01-01 DIAGNOSIS — K5900 Constipation, unspecified: Secondary | ICD-10-CM

## 2011-01-01 DIAGNOSIS — J029 Acute pharyngitis, unspecified: Secondary | ICD-10-CM

## 2011-01-01 DIAGNOSIS — R1032 Left lower quadrant pain: Secondary | ICD-10-CM

## 2011-01-01 DIAGNOSIS — J45909 Unspecified asthma, uncomplicated: Secondary | ICD-10-CM

## 2011-01-01 DIAGNOSIS — J309 Allergic rhinitis, unspecified: Secondary | ICD-10-CM

## 2011-01-08 NOTE — Assessment & Plan Note (Signed)
Summary: NOT FEELING WELL/CLE  [facility]   Vital Signs:  Patient profile:   40 year old female Height:      67.75 inches Weight:      152.50 pounds BMI:     23.44 Temp:     98.1 degrees F oral Pulse rate:   72 / minute Pulse rhythm:   regular BP sitting:   90 / 60  (left arm) Cuff size:   regular  Vitals Entered By: Benny Lennert CMA Duncan Dull) (January 01, 2011 8:31 AM)  History of Present Illness: Chief complaint not feeling well ? sinus infectin and ? diverticulitis  Recent sinus infection...treated with biaxin x 10 days.. changed to flonase and allegra... infection went away but allergies were poorly controlled  so she restarted zyrtec and nasonex.  She notes some shortness of breat with walking up stairs etc. No wheeze. She started back singulair in last few months.  HAs seen ENT and allergy testing in past... was allergic to everything.  Feels congested in maxillary area.. minimal nasal discharge. Off and on she notes left throat swelling .. causes left ear pain.  Having frontal headaches daily. No fever.   Constipation over past 3 weeks, one episode of diarrhea then back to constipation.  Last BM 4 days ago.. had previously been going every day. eating a lot of fiber.  Was using colace...not using now. Occ lower abdominal pain... left lower abdomen x 4 weeks.  Pain started before constipation.  Has history of diverticulitis. Small drop  blood on toilet tissue that she associates with hemmorhoids.  Problems Prior to Update: 1)  Diverticulitis-colon  (ICD-562.11) 2)  Endometriosis  (ICD-617.9) 3)  Abdominal Pain-llq  (ICD-789.04) 4)  Nonspecific Abn Finding Rad & Oth Exam Gi Tract  (ICD-793.4) 5)  Nonspec Abn Findng Rad & Oth Exam Abdominal Area  (ICD-793.6) 6)  Myalgia  (ICD-729.1) 7)  Abdominal Pain, Lower  (ICD-789.09) 8)  Candidiasis of Vulva and Vagina  (ICD-112.1) 9)  Unspecified Vaginitis and Vulvovaginitis  (ICD-616.10) 10)  Acute Maxillary Sinusitis   (ICD-461.0) 11)  Screening For Lipoid Disorders  (ICD-V77.91) 12)  Endometriosis, Ovary  (ICD-617.1) 13)  Fh of Thyroid Cancer, Hx of  (ICD-V10.87) 14)  Gerd  (ICD-530.81) 15)  Common Migraine  (ICD-346.10) 16)  Allergic Rhinitis  (ICD-477.9) 17)  Eczema, Atopic  (ICD-691.8) 18)  Tinnitus, Chronic, Left  (ICD-388.30)  Current Medications (verified): 1)  Multivitamins   Tabs (Multiple Vitamin) .... Take 1 Tablet By Mouth Once A Day 2)  Omega 3 1200 Mg Caps (Omega-3 Fatty Acids) .... Take 2 Capsules By Mouth Once Daily 3)  Sumatriptan Succinate 100 Mg Tabs (Sumatriptan Succinate) .[name].. 1 Tab By Mouth As Needed Migraine, May Repeat X 1  in 2 Hours If Not Relieved. 4)  Proair Hfa 108 (90 Base) Mcg/act  Aers (Albuterol Sulfate) .... 2 Inh Q4h As Needed Shortness of Breath 5)  Singulair 10 Mg Tabs (Montelukast Sodium) .[name].. 1 By Mouth Daily 6)  Zyrtec Hives Relief 10 Mg Tabs (Cetirizine Hcl) .... Take One Tablet Dialy 7)  Nasonex 50 Mcg/act Susp (Mometasone Furoate) .... 2 Sprays in Each Nostril Daily  Allergies (verified): 1)  ! Sulfa 2)  ! Penicillin  Past History:  Past medical, surgical, family and social histories (including risk factors) reviewed, and no changes noted (except as noted below).  Past Medical History: Reviewed history from 07/09/2010 and no changes required. MYALGIA (ICD-729.1) ABDOMINAL PAIN, LOWER (ICD-789.09) CANDIDIASIS OF VULVA AND VAGINA (ICD-112.1) UNSPECIFIED VAGINITIS AND  VULVOVAGINITIS (ICD-616.10) ACUTE MAXILLARY SINUSITIS (ICD-461.0) SCREENING FOR LIPOID DISORDERS (ICD-V77.91) ENDOMETRIOSIS, OVARY (ICD-617.1) Family Hx of THYROID CANCER, HX OF (ICD-V10.87) GERD (ICD-530.81) COMMON MIGRAINE (ICD-346.10) ALLERGIC RHINITIS (ICD-477.9) ECZEMA, ATOPIC (ICD-691.8) TINNITUS, CHRONIC, LEFT (ICD-388.30)  Past Surgical History: Reviewed history from 08/08/2010 and no changes required. Laparoscopic Ovarian Cystectomy   Family History: Reviewed history from  07/09/2010 and no changes required. father: age 14 healthy mother age 68 thyroid cancer brother: healthy MGM; DM MGF: DM PGF: CABG age 75 Family History of Colon Polyps:Father  No FH of Colon Cancer: Family History of Irritable Bowel Syndrome:Mother   Social History: Reviewed history from 08/08/2010 and no changes required. Occupation: stay at home mom, part time administrative work Married Childern Former Smoker x 3 pack year history Alcohol use-yes 1-2 beer per week Drug use-no Regular exercise-yes 3 days/ weeks cardio 45 min. Diet:  3 meals, fruit/veggies, no soda, lots of water   Impression & Recommendations:  Problem # 1:  ABDOMINAL PAIN-LLQ (ICD-789.04) Possibly due to constipation, but with history of diverticulosis and classic area of symptoms... will treat with cipro. Hold on flagyl given symptoms are not extensive. Follow up in  2 weeks.   Problem # 2:  CONSTIPATION (ICD-564.00) Start daily miralax. Continue water, fiber and regular exercise.   Problem # 3:  ALLERGIC RHINITIS (ICD-477.9) No clear current sinus infeciton... symptoms may be due to chronic allergies.  Will continur nasal saline irrigation, singulair and nasonex but will do trial of clarinex.  The following medications were removed from the medication list:    Flonase 50 Mcg/act Susp (Fluticasone propionate) .[name]... 2 sprays twice daily per nostril Her updated medication list for this problem includes:    Zyrtec Hives Relief 10 Mg Tabs (Cetirizine hcl) .[name]... Take one tablet dialy    Nasonex 50 Mcg/act Susp (Mometasone furoate) .[name]... 2 sprays in each nostril daily    Clarinex 5 Mg Tabs (Desloratadine) .[name]... 1 tab by mouth daily  Problem # 4:  ? of ASTHMA, UNSPECIFIED, UNSPECIFIED STATUS (ICD-493.90) Will eval with lung function tests pre and post albuterol at next appt in 2 weeks.  Her updated medication list for this problem includes:    Proair Hfa 108 (90 Base) Mcg/act Aers (Albuterol sulfate) .[name]... 2  inh q4h as needed shortness of breath    Singulair 10 Mg Tabs (Montelukast sodium) .[name]... 1 by mouth daily  Complete Medication List: 1)  Multivitamins Tabs (Multiple vitamin) .... Take 1 tablet by mouth once a day 2)  Omega 3 1200 Mg Caps (Omega-3 fatty acids) .... Take 2 capsules by mouth once daily 3)  Sumatriptan Succinate 100 Mg Tabs (Sumatriptan succinate) .[name].. 1 tab by mouth as needed migraine, may repeat x 1  in 2 hours if not relieved. 4)  Proair Hfa 108 (90 Base) Mcg/act Aers (Albuterol sulfate) .... 2 inh q4h as needed shortness of breath 5)  Singulair 10 Mg Tabs (Montelukast sodium) .[name].. 1 by mouth daily 6)  Zyrtec Hives Relief 10 Mg Tabs (Cetirizine hcl) .... Take one tablet dialy 7)  Nasonex 50 Mcg/act Susp (Mometasone furoate) .... 2 sprays in each nostril daily 8)  Clarinex 5 Mg Tabs (Desloratadine) .[name].. 1 tab by mouth daily 9)  Ciprofloxacin Hcl 750 Mg Tabs (Ciprofloxacin hcl) .[name].. 1 tab by mouth two times a day x 7 days  Patient Instructions: 1)  Start daily miralax to help with constipation. 2)  Continue fiber and water. 3)   If abdominal pain not improving with regualr BMs... start  cipro for possible diverticulitis. 4)  Change zyrtec to clarinex. Continue other allergy meds. 5)  Call if eaither issue not improving. 6)  Please schedule a follow-up appointment in 2 weeks 30 min  for follow up and lung function tests pre and post albuterol.  Prescriptions: CIPROFLOXACIN HCL 750 MG TABS (CIPROFLOXACIN HCL) 1 tab by mouth two times a day x 7 days  #14 x 0   Entered and Authorized by:   Kerby Nora MD   Signed by:   Kerby Nora MD on 01/01/2011   Method used:   Electronically to        [facility]* (retail)       [address]       [place], [place]  [address]       Ph: [phone]       Fax: [phone]   RxID:   [id number] CLARINEX 5 MG TABS (DESLORATADINE) 1 tab by mouth daily  #30 x 0   Entered and Authorized by:   Kerby Nora MD   Signed by:   Kerby Nora  MD on 01/01/2011   Method used:   Electronically to        [facility]* (retail)       [address]       [place], [place]  [address]       Ph: [phone]       Fax: [phone]   RxID:   [id number]    Orders Added: 1)  Est. Patient Level IV [15176]     Prior Medications: MULTIVITAMINS   TABS (MULTIPLE VITAMIN) Take 1 tablet by mouth once a day OMEGA 3 1200 MG CAPS (OMEGA-3 FATTY ACIDS) Take 2 capsules by mouth once daily SUMATRIPTAN SUCCINATE 100 MG TABS (SUMATRIPTAN SUCCINATE) 1 tab by mouth as needed migraine, May repeat x 1  in 2 hours if not relieved. PROAIR HFA 108 (90 BASE) MCG/ACT  AERS (ALBUTEROL SULFATE) 2 inh q4h as needed shortness of breath SINGULAIR 10 MG TABS (MONTELUKAST SODIUM) 1 by mouth daily Current Allergies (reviewed today): ! SULFA ! PENICILLIN  Appended Document: NOT FEELING WELL/CLE  [facility]    Clinical Lists Changes  Problems: Added new problem of SORE THROAT (ICD-462) Orders: Added new Service order of Rapid Strep [phone]) - Signed Observations: Added new observation of RAPID STREP: negative (01/01/2011 9:18)      Laboratory Results    Other Tests  Rapid Strep: negative  Kit Test Internal QC: Negative   (Normal Range: Negative)

## 2011-01-15 ENCOUNTER — Other Ambulatory Visit: Payer: Self-pay | Admitting: *Deleted

## 2011-01-15 DIAGNOSIS — B373 Candidiasis of vulva and vagina: Secondary | ICD-10-CM

## 2011-01-15 DIAGNOSIS — R0600 Dyspnea, unspecified: Secondary | ICD-10-CM

## 2011-01-15 DIAGNOSIS — J309 Allergic rhinitis, unspecified: Secondary | ICD-10-CM

## 2011-01-15 MED ORDER — FLUCONAZOLE 150 MG PO TABS: 150.0000 mg | ORAL_TABLET | Freq: Once | ORAL | Status: AC

## 2011-01-15 NOTE — Telephone Encounter (Signed)
Pt was taking antibiotic last week and she now has a yeast infection.  She is asking that something be called to [facility].  Also, she was told to come back for spirometry but your next appt is not until 4/23.  She is asking if ok to wait till that long.

## 2011-01-15 NOTE — Telephone Encounter (Signed)
She can schedule appt with different provider sooner.. Or as I would prefer....we can refer her for complete lung function tests at outside place like [facility]  and review results before her next appt.  Let me know if she is interested in referral .. Otherwise schedule her an appt with diff provider.

## 2011-01-15 NOTE — Telephone Encounter (Signed)
Please phone in diflucan as written.  It is okay to wait until April for lung function tests.

## 2011-01-15 NOTE — Telephone Encounter (Signed)
Patient does not want to wait for April appointment and wants to know if she should try to schedule with another provider or will you work her in some where.

## 2011-01-16 ENCOUNTER — Other Ambulatory Visit (HOSPITAL_COMMUNITY): Payer: Self-pay | Admitting: Family Medicine

## 2011-01-16 ENCOUNTER — Other Ambulatory Visit (HOSPITAL_COMMUNITY): Payer: Self-pay | Admitting: Emergency Medicine

## 2011-01-16 DIAGNOSIS — R0600 Dyspnea, unspecified: Secondary | ICD-10-CM | POA: Insufficient documentation

## 2011-01-16 NOTE — Telephone Encounter (Signed)
Patient advised and would rather see different provider here

## 2011-01-16 NOTE — Telephone Encounter (Signed)
Addended byKerby Nora on: 01/16/2011 11:27 AM   Modules accepted: Orders

## 2011-01-16 NOTE — Telephone Encounter (Addendum)
Patient called back again... She would like to be referred out for PFTs and will keep her appt in April with [facility].  Will send referral to [facility] to have these scheduled. Notify pt.

## 2011-01-17 ENCOUNTER — Other Ambulatory Visit (HOSPITAL_COMMUNITY): Payer: Self-pay | Admitting: Emergency Medicine

## 2011-01-22 ENCOUNTER — Encounter (HOSPITAL_COMMUNITY): Payer: 59

## 2011-01-22 ENCOUNTER — Ambulatory Visit (HOSPITAL_COMMUNITY)
Admission: RE | Admit: 2011-01-22 | Discharge: 2011-01-22 | Disposition: A | Discharge status: Home / Self Care | Payer: 59 | Source: Ambulatory Visit | Attending: Family Medicine | Admitting: Family Medicine

## 2011-01-22 DIAGNOSIS — R0989 Other specified symptoms and signs involving the circulatory and respiratory systems: Secondary | ICD-10-CM | POA: Insufficient documentation

## 2011-01-22 DIAGNOSIS — R0609 Other forms of dyspnea: Secondary | ICD-10-CM | POA: Insufficient documentation

## 2011-01-22 LAB — PULMONARY FUNCTION TEST

## 2011-01-25 ENCOUNTER — Telehealth: Payer: Self-pay | Admitting: *Deleted

## 2011-01-25 NOTE — Telephone Encounter (Signed)
Please call now and make a f/u for the patient on Tuesday with Dr. Ermalene Searing

## 2011-01-25 NOTE — Telephone Encounter (Signed)
Patient is calling regarding her PFT results. I do not see that we received the results and she is concerned about going through the weekend. She says that right now she is doing okay, but yesterday had a hard time with breathing, she is asking what can be done at this point.

## 2011-01-28 ENCOUNTER — Ambulatory Visit (INDEPENDENT_AMBULATORY_CARE_PROVIDER_SITE_OTHER): Payer: 59 | Admitting: Family Medicine

## 2011-01-28 ENCOUNTER — Encounter: Payer: Self-pay | Admitting: Family Medicine

## 2011-01-28 VITALS — BP 100/60 | HR 76 | Temp 98.9°F | Ht 68.5 in | Wt 153.0 lb

## 2011-01-28 DIAGNOSIS — J45909 Unspecified asthma, uncomplicated: Secondary | ICD-10-CM

## 2011-01-28 DIAGNOSIS — J4540 Moderate persistent asthma, uncomplicated: Secondary | ICD-10-CM

## 2011-01-28 MED ORDER — BECLOMETHASONE DIPROPIONATE 80 MCG/ACT IN AERS: 1.0000 | INHALATION_SPRAY | RESPIRATORY_TRACT | Status: DC | PRN

## 2011-01-28 MED ORDER — ALBUTEROL SULFATE HFA 108 (90 BASE) MCG/ACT IN AERS: 2.0000 | INHALATION_SPRAY | RESPIRATORY_TRACT | Status: DC | PRN

## 2011-01-28 MED ORDER — AEROCHAMBER MV MISC: Status: DC

## 2011-01-28 NOTE — Progress Notes (Signed)
40 year old female:  Asthma, new onset: Hurting and tight in her chest, deefinitely when outside and any physical activity On sat walked up six steps and really had to gasp for air Daily symptoms at this point Albuterol helped much better at PFT's compared to home [facility] No ER visits, no prior hosp.  Exercise Daily smoker, 6-8 a day, stopped 16 years ago.  AR, also, taking Singulair for this  The PMH, PSH, Social History, Family History, Medications, and allergies have been reviewed in [facility], and have been updated if relevant.  ROS: GEN: No acute illnesses, no fevers, chills. GI: No n/v/d, eating normally Pulm: as above Interactive and getting along well at home.  Otherwise, ROS is as per the HPI. GEN: WDWN, NAD, Non-toxic, A & O x 3 HEENT: Atraumatic, Normocephalic. Neck supple. No masses, No LAD. Ears and Nose: No external deformity. CV: RRR, No M/G/R. No JVD. No thrill. No extra heart sounds. PULM: CTA B, no wheezes, crackles, rhonchi. No retractions. No resp. distress. No accessory muscle use.  EXTR: No c/c/e NEURO Normal gait.  PSYCH: Normally interactive. Conversant. Not depressed or anxious appearing.  Calm demeanor.   A/P: Asthma, new, poorly controlled. Start Qvar 80 mg bid, refill ventolin. Cont Singulair. Start chamber.

## 2011-01-28 NOTE — Patient Instructions (Signed)
ROB, PLEASE SHOW HOW TO USE CHAMBER WITH INHALERS IF CANNOT, COME BACK TO OUR OFFICE, AND WE CAN SHOW YOU HOW   RECHECK WITH DR. Ermalene Searing IN 4 WEEKS   Asthma, Adult, Extended Version Asthma is a long lasting disease of the lungs that is often linked to allergies, heredity (family history), and your surroundings. It is a condition which causes an increased airway inflammation (soreness and redness). This causes recurring attacks of wheezing, coughing, chest constriction & difficult breathing. One of the main symptoms of asthma, airway inflammation, is caused by the airways in the lungs being overly sensitive to certain triggers. These triggers will vary from person to person and time to time. They may cause a narrowing of the air passages in the lungs. Asthma may be triggered by pollen, dust, animal dander, molds, some foods, respiratory infections, exposure to smoke, exercise, emotional stress, or other allergens (things that cause allergic reactions or allergies). Repeat attacks are common. It is not known what causes patients to have asthma in the first place, so there is no cure, just relief. Asthma has been increasing and this could be due to industrial pollution.  Adults with asthma usually fall into three groups:  People who have had asthma since childhood   Those who had the disease in childhood, got well, then suddenly had asthma return in their 18's or 37's   Those who develop occupational (job-related) asthma.  SYMPTOMS Asthma causes various breathing difficulties and varies from person to person. Usually, it is easier to get the air into the lungs and more difficult to get the air out. There may be wheezing, coughing, shortness of breath and tightness in the chest. When it is very difficult to breathe, there is often anxiety and a feeling as though you cannot get enough air.  CAUSES Three common job-related substances that can cause allergies and asthma attacks include substances    found in wrapping vinyl for meat, cottonseed and petroleum products. Sensitivity to meat wrapping vinyl is also called meat wrappers disease. Common triggers for asthma include:  Dust.  An infection.   Reactions to certain medications.   Animal dander.  Breathing cold air.   Workplace chemicals.   Pollen.  Exertion.   Cigarette smoke.   HOME CARE INSTRUCTIONS  The general principal of asthma is avoidance. If you find substances that causes asthmatic attacks, avoid them. If a couple specific things are found to cause your attacks, an allergist may be able to help you by using treatment to desensitize your system.   If you are a smoker, quit smoking. Avoid exposure to second hand smoke.   Use prescription medications as ordered by your caregiver. Medications used are for reducing or preventing attacks. They do not cure asthma.   Talk to your caregiver about an action plan for managing asthma attacks at home including the use of a peak flow meter which measures the severity of your asthma attack. An action plan can help minimize or stop the attack without having to seek medical care.   Always have a plan prepared for seeking medical attention including calling your physician, access to local emergency care, and calling 911 for a severe attack.   You may have fewer attacks if you decrease dust in your home. Electrostatic air cleaners may help.   It may help to replace your pillows or mattress with materials less likely to cause allergies.   If you are not on fluid restriction, drink 8 to 10 glasses of water  each day.   Discuss possible exercise routines with your caregiver.   Relaxation techniques may help during an asthmatic attack.   If animal dander is the cause of asthma, you may need to get rid of pets.   You may have to change jobs if your asthma is caused by your job.   Be aware of the signs of asthma that require emergency medical care (see below).  SEEK IMMEDIATE  MEDICAL CARE IF:  Your usual medicines do not stop your wheezing, or there is increased coughing and/or shortness of breath.   An oral temperature above 102 develops.   You have muscle aches, chest pain, or thickening of sputum.   Your sputum changes from clear or white to yellow, green, gray, or bloody.   You have any problems that may be related to the medicine you are taking (such as a rash, itching, swelling, or trouble breathing).  MAKE SURE YOU:   Understand these instructions.   Will watch your condition.   Will get help right away if you are not doing well or get worse.  Document Released: 10/14/2005 Document Re-Released: 09/26/2008 [facility] Patient Information 2011 [name], [place].

## 2011-02-04 LAB — COMPREHENSIVE METABOLIC PANEL
ALT: 16 U/L (ref 0–35)
Albumin: 3.8 g/dL (ref 3.5–5.2)
Alkaline Phosphatase: 57 U/L (ref 39–117)
BUN: 13 mg/dL (ref 6–23)
Potassium: 4.2 mEq/L (ref 3.5–5.1)
Sodium: 140 mEq/L (ref 135–145)
Total Protein: 6.8 g/dL (ref 6.0–8.3)

## 2011-02-04 LAB — PREGNANCY, URINE: Preg Test, Ur: NEGATIVE

## 2011-02-04 LAB — CBC
Platelets: 202 10*3/uL (ref 150–400)
RDW: 13.6 % (ref 11.5–15.5)

## 2011-02-04 LAB — CA 125: CA 125: 10.5 U/mL (ref 0.0–30.2)

## 2011-02-13 ENCOUNTER — Encounter: Payer: Self-pay | Admitting: Family Medicine

## 2011-02-15 ENCOUNTER — Other Ambulatory Visit: Payer: Self-pay | Admitting: *Deleted

## 2011-02-15 MED ORDER — BECLOMETHASONE DIPROPIONATE 80 MCG/ACT IN AERS: 1.0000 | INHALATION_SPRAY | RESPIRATORY_TRACT | Status: DC | PRN

## 2011-02-15 MED ORDER — MONTELUKAST SODIUM 10 MG PO TABS: 10.0000 mg | ORAL_TABLET | Freq: Every day | ORAL | Status: DC

## 2011-02-15 NOTE — Telephone Encounter (Signed)
Opened in error

## 2011-02-21 ENCOUNTER — Telehealth: Payer: Self-pay | Admitting: *Deleted

## 2011-02-21 NOTE — Telephone Encounter (Signed)
Pharmacy called to verify directions on Q-var, based on office note from 4/02 I advised 80 mg's twice a day.  Number 3 with 3 refills.

## 2011-02-22 NOTE — Telephone Encounter (Signed)
agree

## 2011-02-26 ENCOUNTER — Ambulatory Visit (INDEPENDENT_AMBULATORY_CARE_PROVIDER_SITE_OTHER): Payer: 59 | Admitting: Family Medicine

## 2011-02-26 ENCOUNTER — Encounter: Payer: Self-pay | Admitting: Family Medicine

## 2011-02-26 VITALS — BP 110/72 | HR 59 | Temp 98.1°F | Ht 68.5 in | Wt 152.1 lb

## 2011-02-26 DIAGNOSIS — J45909 Unspecified asthma, uncomplicated: Secondary | ICD-10-CM

## 2011-02-26 DIAGNOSIS — J4540 Moderate persistent asthma, uncomplicated: Secondary | ICD-10-CM

## 2011-02-26 LAB — PULMONARY FUNCTION TEST

## 2011-02-26 MED ORDER — BECLOMETHASONE DIPROPIONATE 80 MCG/ACT IN AERS: 2.0000 | INHALATION_SPRAY | Freq: Two times a day (BID) | RESPIRATORY_TRACT | Status: DC

## 2011-02-26 NOTE — Assessment & Plan Note (Addendum)
PFT show now mild obstruction... With definate improvement  with albuterol.  Improved on current regimen compared to PFTs in 12/2010 showing moderate obstruction.  Still some room for further improvement....increase QVar to 160 mcg BID. Follow up in 3 months.

## 2011-02-26 NOTE — Progress Notes (Signed)
  Subjective:    Patient ID: Anna [name], female    DOB: May 05, 1971, 40 y.o.   MRN: [id number]  HPI  40 year old...breathing overall better since starting QVAR.  Still on singulair, zyrtec and nasonex.  She still notes SOB with exercising.. Albuterol seems to help if used before exercsie. She does have heart racing and SOB with going up stairs still.  No chest pain.  No further face pain, no congestion. Continued post nasal drip. Left side of throat intermittantly tender. No fever.    Review of Systems  Per HPI...otherwise noncontributory     Objective:   Physical Exam  Constitutional: Vital signs are normal. She appears well-developed and well-nourished. She is cooperative.  Non-toxic appearance. She does not appear ill. No distress.  HENT:  Head: Normocephalic.  Right Ear: Hearing, tympanic membrane, external ear and ear canal normal. Tympanic membrane is not erythematous, not retracted and not bulging.  Left Ear: Hearing, tympanic membrane, external ear and ear canal normal. Tympanic membrane is not erythematous, not retracted and not bulging.  Nose: Mucosal edema and rhinorrhea present. Right sinus exhibits no maxillary sinus tenderness and no frontal sinus tenderness. Left sinus exhibits no maxillary sinus tenderness and no frontal sinus tenderness.  Mouth/Throat: Uvula is midline, oropharynx is clear and moist and mucous membranes are normal.  Eyes: Conjunctivae, EOM and lids are normal. Pupils are equal, round, and reactive to light. No foreign bodies found.  Neck: Trachea normal and normal range of motion. Neck supple. Carotid bruit is not present. No mass and no thyromegaly present.  Cardiovascular: Normal rate, regular rhythm, S1 normal, S2 normal, normal heart sounds, intact distal pulses and normal pulses.  Exam reveals no gallop and no friction rub.   No murmur heard. Pulmonary/Chest: Effort normal and breath sounds normal. Not tachypneic. No respiratory distress.  She has no decreased breath sounds. She has no wheezes. She has no rhonchi. She has no rales.  Genitourinary: Vagina normal and uterus normal.  Neurological: She is alert.  Skin: Skin is warm, dry and intact. No rash noted.  Psychiatric: Her speech is normal and behavior is normal. Judgment normal. Her mood appears not anxious. Cognition and memory are normal. She does not exhibit a depressed mood.          Assessment & Plan:

## 2011-02-26 NOTE — Patient Instructions (Signed)
Increase QVar to 2 puffs BID. Call if side effects from increase in QVar or poor control of shortness of breath.

## 2011-03-12 NOTE — H&P (Signed)
NAMECLARABELL, MATSUOKA                 ACCOUNT NO.:  [id number]   MEDICAL RECORD NO.:  [id number]          PATIENT TYPE:  AMB   LOCATION:  SDC                           [facility]ACILITY:  [facility]   PHYSICIAN:  Dois Davenport A. Rivard, M.D. DATE OF BIRTH:  1971-07-09   DATE OF ADMISSION:  DATE OF DISCHARGE:                              HISTORY & PHYSICAL   HISTORY OF PRESENT ILLNESS:  Ms. Schnieders is a 40 year old married white  female, para 2-0-0-2, presenting for laparoscopic left ovarian  cystectomy because of a persistent complex left ovarian cyst and pelvic  pain.  The patient presented in February 2010 complaining of a  persistent left lower quadrant pain that occurred off and on during the  week of her menstrual flow.  The patient states that this pain would  last approximately 45 minutes with some relief experience during and  following a bowel movement.  She denies any urinary tract symptoms,  nausea, vomiting, fever, diarrhea, or intermenstrual bleeding.  The  patient was counseled at that time regarding the possible  gastrointestinal etiology of her pain and was placed on Align.  In the  interim, a pelvic ultrasound revealed the patient having a uterus  measuring 4.26 x 7.15 x 5.43 with the right ovary measuring 4.3 x 4.43 x  3.93 cm, a left ovary measuring 3.01 x 3.64 x 3.08 cm, three fibroids  all of which were less than 2.5 cm, a left hemorrhagic ovarian cyst  measuring 2.5 cm, and a right simple ovarian cyst measuring 4.1 cm.  Both of the patient's ovarian cyst had normal Doppler flow.  The patient  was then placed on Yaz oral contraceptives for 2 months, at which time  her ultrasound was repeated and showed a persistent left ovarian  cystocele measuring 2.5 cm with resolution of her right ovarian cyst.  Though the patient did report some improvement in her left lower  quadrant pain, she does admit that it persists.  Given the persistent  nature of her left lower quadrant pain and the left  ovarian cyst, the  patient has decided to proceed with surgical exploration and management  of her symptoms.  Additionally, the patient requests to undergo  marsupialization of her right Bartholin cyst.   PAST MEDICAL HISTORY:  OB History:  Gravida 2, para 1-1-0-2.  The  patient had vaginal deliveries.  GYN History:  Menarche 40 years old.  Last menstrual period February 21, 2009.  The patient's menstrual flow is  characterized by lasting 4 days with tampon change every 2 hours and  occasional clotting.  She uses condoms as a method of contraception.  Denies any sexually transmitted diseases, underwent cryotherapy for CIN-  1 in 1991.  Most recent Pap smear October 2009 which was normal.   MEDICAL HISTORY:  1. Migraine headaches.  2. Mitral valve prolapse.  3. Vulvar eczema.  4. Uterine fibroids.   SURGICAL HISTORY:  Dental.   FAMILY HISTORY:  1. Cardiovascular disease.  2. Mitral valve prolapse.  3. Thyroid disease.  4. Cancer.  5. Hypertension.  6. Anemia.  7. Diabetes mellitus.  SOCIAL HISTORY:  The patient is married.  She is a stay-at-home mom.   HABITS:  The patient does not use tobacco or illicit drugs.  She does  consume alcohol on occasion.   CURRENT MEDICATIONS:  Ibuprofen 800 mg as needed.   ALLERGIES:  She is allergic to SULFA and to PENICILLIN.   REVIEW OF SYSTEMS:  The patient admits to occasional constipation,  hemorrhoids, tinnitus, but denies any chest pain, shortness of breath,  night sweats, recent weight loss, vision changes, and except as is  mentioned in history of present illness.  The patient's review of  systems is otherwise negative.   PHYSICAL EXAMINATION:  VITAL SIGNS:  Blood pressure 106/66, pulse is 72,  weight 139, and height 5 feet 80 inches tall.  Body mass index 21.1.  BACK:  No CVA tenderness.  ABDOMEN:  The patient did have tenderness in the left lower quadrant,  but no masses or organomegaly.  PELVIC:  EGBUS was normal.  Vagina was  normal.  Cervix was nontender  without lesions.  Uterus appeared normal size, shape, and consistency  without tenderness.  Adnexa, no tenderness or masses.   IMPRESSION:  1. Persistent complex left ovarian cyst.  2. Pelvic pain.  3. Right Bartholin cyst.   DISPOSITION:  A discussion was held with the patient regarding the  indications for her procedures along with their risks which include but  are not limited to reaction to anesthesia, damage to adjacent organs,  infection, excessive bleeding, and dyspareunia due to scar tissue from  marsupialization; however, the patient consents to proceed with all  procedures at [facility] [place] on April 20, 2009.      Elmira J. Adline Peals.      Crist Fat Rivard, M.D.  Electronically Signed    EJP/MEDQ  D:  04/17/2009  T:  04/18/2009  Job:  [id number]

## 2011-03-12 NOTE — Op Note (Signed)
Anna [name], Anna [name]                 ACCOUNT NO.:  [id number]   MEDICAL RECORD NO.:  [id number]          PATIENT TYPE:  AMB   LOCATION:  SDC                           [facility]ILITY:  [facility]   PHYSICIAN:  Dois Davenport A. Rivard, M.D. DATE OF BIRTH:  Mar 14, 1971   DATE OF PROCEDURE:  04/20/2009  DATE OF DISCHARGE:                               OPERATIVE REPORT   PREOPERATIVE DIAGNOSES:  Pelvic pain with left ovarian cyst and right  Bartholin cyst.   POSTOPERATIVE DIAGNOSES:  Pelvic pain with left ovarian endometrioma and  right vulvar sebaceous cyst.   ANESTHESIA:  General.   PROCEDURE:  Laparoscopic left ovarian cystectomy and right vulvar cyst  removal.   SURGEON:  Dois Davenport A. Rivard, MD   ASSISTANT:  Elmira J. Lowell Guitar, PA.   ESTIMATED BLOOD LOSS:  Minimal.   PROCEDURE:  After being informed of the planned procedure with possible  complications including bleeding, infection, injury to bladder, bowel or  ureters, need for laparotomy, need for oophorectomy, informed consent  was obtained.  The patient was taken to OR #3, given general anesthesia  with endotracheal intubation without any complication.  She was placed  in the lithotomy position, prepped and draped in a sterile fashion and a  Foley catheter was inserted in her bladder.  A speculum was inserted in  the vagina.  Anterior lip of the cervix was grasped with a tenaculum  forceps and an acorn manipulator was placed in the uterus, the speculum  was removed.   We infiltrated the umbilical area with 5 mL of Marcaine 0.25, performed  a 5-mm incision, inserted a Veress needle, confirmed placement with drop  test and insufflate a pneumoperitoneum with CO2 at a maximum pressure of  15 mmHg.  Veress needle was removed and the 5-mm trocar was inserted in  the abdominal cavity.  We were able to enter a 5-mm laparoscope mounted  on video.  A 5-mm suprapubic trocar and a 5-mm left lower quadrant  trocar were then inserted under direct visualization  after infiltrating  with Marcaine 0.25.   OBSERVATION:  Anterior cul-de-sac was normal, uterus was normal, and  posterior cul-de-sac was normal.  Right tube was completely normal and  right ovary was normal.  We do see a peritoneal window just above the  ureter near the infundibulopelvic ligament on the right pelvic wall.  On  the left side, the ovary has a 2.5-cm cyst.  It was stuck to the pelvic  wall and freed bluntly.  The tube has a normal fimbriae, but just a  centimeter above the fimbriae has a little cyst measuring 1 cm.  The  ovary was covered with a little speckles of her brownish-flat lesions  compatible with endometriosis and the area of adhesion on the pelvic  wall was also the site of 2-3 mm flat-brown lesions compatible with  endometriosis, but those were overlooking the ureter.  Using a monopolar  scissor, we incised the capsule of the ovary which let us evacuate  chocolate brown fluid which was suctioned away with blunt dissection.  We removed about three-quarters of the  capsule of the cyst which was  very adherent to the ovary due to a very close by corpus luteum.  We  then cauterized the reminder of the cyst wall and any bleeding site on  the capsule of the ovary.  We then irrigated profusely with warm saline  and noticed satisfactory hemostasis.  Instruments were removed after  evacuating the pneumoperitoneum and all 3 incisions were closed with  subcuticular suture of 3-0 Monocryl and Dermabond.   We then moved to the vulvar side and infiltrated the 1.5 cm right  labial sebaceous cyst with Marcaine 0.25, performed a vertical incision  and removed the cyst with its capsule entirely.  The incision was then  closed with interrupted 4-0 Vicryl sutures.   Instrument and sponge count was complete x2.  Estimated blood loss was  minimal.  The procedure was very well tolerated by the patient, who was  taken to recovery room in well and stable condition.   SPECIMEN:  Left  ovarian cyst wall and right vulvar cyst sent to  Pathology.      Crist Fat Rivard, M.D.  Electronically Signed     SAR/MEDQ  D:  04/20/2009  T:  04/21/2009  Job:  [id number]

## 2011-03-15 NOTE — Op Note (Signed)
[facility]  Patient:    Anna [name], Anna [name] Visit Number: [id number] MRN: [id number]          Service Type: OBS Location: 910A 9101 [id number] Attending Physician:  Lenoard Aden Dictated by:   Lenoard Aden, M.D. Proc. Date: 07/31/01 Admit Date:  07/30/2001                             Operative Report  INDICATIONS:                  Nonreassuring tracing after the patient became completely dilated +3 station, persistent fetal decelerations in the 60-80 beat per minute range were noted with pushing with late return to baseline after contractions.  Decision made to proceed with outlet vacuum assisted delivery.  Risks, benefits of vacuum assisted delivery discussed with the patient and husband.  They comply and desire to proceed.  Mityvac M cup applied to occiput anterior presentation +3/+4 for vacuum assisted delivery on two pulls over first degree laceration.  Apgars 8/9.  Full-term living female from occiput anterior position.  Placenta is delivered spontaneously intact. Three vessel cord noted.  Normal cervix.  No evidence of lacerations.  No other vaginal lacerations noted.  Perineum otherwise intact.  Repair with 3-0 Vicryl Rapide x 1.  Estimated blood loss 500 cc.  Bulb suctioning performed on the perineum.  Mother and baby tolerated procedure well and are recovering well. Dictated by:   Lenoard Aden, M.D. Attending Physician:  Lenoard Aden DD:  07/31/01 TD:  07/31/01 Job: [id number]

## 2011-03-15 NOTE — Consult Note (Signed)
Anna [name], [name]                           ACCOUNT NO.:  [id number]   MEDICAL RECORD NO.:  [id number]                   PATIENT TYPE:  MAT   LOCATION:  MATC                                 FACILITY:  [facility]   PHYSICIAN:  Lenoard Aden, M.D.             DATE OF BIRTH:  Oct 02, 1971   DATE OF CONSULTATION:  04/20/2004  DATE OF DISCHARGE:                                   CONSULTATION   CHIEF COMPLAINT:  Rule out preterm labor.   HISTORY OF PRESENT ILLNESS:  This patient is a 40 year old white female  G  2, P 1 at 31-5/7ths weeks gestation with preterm cervical change and  contractions.   ALLERGIES:  The patient has a history of allergy to PENICILLIN.   MEDICATIONS:  Prenatal vitamins and 17-hydroxyprogesterone weekly.   PAST OBSTETRICAL HISTORY:  The patient has a history of a 6 pound 1 ounce  female born at 7 weeks without complications, complicated by  a history of  preterm labor.   PAST MEDICAL HISTORY:  History of tinnitus in her left ear.  No other  medical or surgical hospitalizations.   FAMILY HISTORY:  Family history of alcohol abuse, hypertension and heart  disease.   PRENATAL LABORATORY DATA:  Prenatal lab data reveals a blood type O  negative.  Rubella immune.  Hepatitis and HIV negative.  GC and Chlamydia  negative.  GBS positive.   PHYSICAL EXAMINATION:  GENERAL APPEARANCE:  Well-developed, well-nourished  white female in no acute distress.  HEENT: Normal.  LUNGS:  Lungs clear.  HEART:  Regular rhythm.  ABDOMEN:  Abdomen is soft, gravid and nontender.  VAGINAL EXAMINATION:  Cervix per Dr. Cherly Hensen is 1 cm, 50%, vertex and -2.  EXTREMITIES:  There are no cords.  NEUROLOGIC:  Neurologic exam is nonfocal.   NST is reactive.  The patient is having contractions every two minutes and  responded to subcu terbutaline times one, now with no contractions times 15  minutes.   IMPRESSION:  1. Thirty-one-and-five-sevenths weeks intrauterine pregnancy.  2. Preterm  contractions with cervical change responsive to subcutaneous     terbutaline.   PLAN:  1. Continue Procardia 10 mg p.o. q.4 hours.  2. Responsive to terbutaline.  3. We will discharge home.  4. Betamethasone 12.5 mg IM given today, repeat in 24 hours.  5. We will perform fetal fibronectin with betamethasone on April 21, 2004.                                               Lenoard Aden, M.D.    RJT/MEDQ  D:  04/20/2004  T:  04/20/2004  Job:  [id number]   cc:   [facility]

## 2011-04-15 ENCOUNTER — Other Ambulatory Visit: Payer: Self-pay | Admitting: *Deleted

## 2011-04-15 ENCOUNTER — Ambulatory Visit (INDEPENDENT_AMBULATORY_CARE_PROVIDER_SITE_OTHER): Payer: 59 | Admitting: Family Medicine

## 2011-04-15 ENCOUNTER — Encounter: Payer: Self-pay | Admitting: Family Medicine

## 2011-04-15 DIAGNOSIS — J45909 Unspecified asthma, uncomplicated: Secondary | ICD-10-CM

## 2011-04-15 DIAGNOSIS — J4540 Moderate persistent asthma, uncomplicated: Secondary | ICD-10-CM

## 2011-04-15 DIAGNOSIS — J069 Acute upper respiratory infection, unspecified: Secondary | ICD-10-CM

## 2011-04-15 MED ORDER — PREDNISONE 20 MG PO TABS: ORAL_TABLET | ORAL | Status: DC

## 2011-04-15 NOTE — Telephone Encounter (Signed)
Opened in error

## 2011-04-16 NOTE — Progress Notes (Signed)
Subjective:    Patient ID: Anna [name], female    DOB: July 30, 1971, 40 y.o.   MRN: [id number]  HPI  Asthma Exacerbation: She has previously been evaluated here for asthma and now presents with an asthma exacerbation. This exacerbation began 3 days ago.  Associated symptoms include nonproductive cough and wheezing.  Suspected precipitants include upper respiratory infection.  Symptoms have been unchanged since their onset.  This is the first evaluation that has occurred during this exacerbation. The previous exacerbation occurred several months ago. She has treated this current exacerbation with short-acting inhaled beta-adrenergic agonists. The patient reports adherence to this regimen. Compliance with inhaled corticosteroids  Son with a cold last week Mild rhinnorhea  Patient Active Problem List  Diagnoses  . COMMON MIGRAINE  . TINNITUS, CHRONIC, LEFT  . ALLERGIC RHINITIS  . GERD  . DIVERTICULITIS-COLON  . ENDOMETRIOSIS, OVARY  . ECZEMA, ATOPIC  . MYALGIA  . ABDOMINAL PAIN-LLQ  . ABDOMINAL PAIN, LOWER  . CONSTIPATION  . Asthma, moderate persistent   No past medical history on file. No past surgical history on file. History  Substance Use Topics  . Smoking status: Former Games developer  . Smokeless tobacco: Not on file   Comment: quit over 16 years ago  . Alcohol Use: 0.5 - 1.0 oz/week    1-2 drink(s) per week     wine   No family history on file. Allergies  Allergen Reactions  . Penicillins     REACTION: rash  . Sulfonamide Derivatives     REACTION: rash   Current Outpatient Prescriptions on File Prior to Visit  Medication Sig Dispense Refill  . albuterol (VENTOLIN HFA) 108 (90 BASE) MCG/ACT inhaler Inhale 2 puffs into the lungs every 4 (four) hours as needed.  1 Inhaler  3  . beclomethasone (QVAR) 80 MCG/ACT inhaler Inhale 2 puffs into the lungs 2 (two) times daily.  3 Inhaler  3  . Cetirizine HCl (ZYRTEC ALLERGY) 10 MG CAPS Take by mouth daily.        [name]  desogestrel-ethinyl estradiol (APRI) 0.15-30 MG-MCG per tablet Take 1 tablet by mouth daily.        . mometasone (NASONEX) 50 MCG/ACT nasal spray 2 sprays by Nasal route daily.        . montelukast (SINGULAIR) 10 MG tablet Take 1 tablet (10 mg total) by mouth daily.  90 tablet  3  . Multiple Vitamin (MULTIVITAMIN) tablet Take 1 tablet by mouth daily.        . OMEGA 3 1200 MG CAPS Take 2 capsules by mouth once daily       . Spacer/Aero-Holding Chambers (AEROCHAMBER MV) inhaler Use as instructed  1 each  0  . Spacer/Aero-Holding Chambers (OPTICHAMBER ADVANTAGE-LG MASK) MISC       . SUMAtriptan (IMITREX) 100 MG tablet Take one tablet by mouth as needed migraine, may repeat x 1 in 2 hours if not relieved       . desloratadine (CLARINEX) 5 MG tablet Take 5 mg by mouth daily.        . fluticasone (FLONASE) 50 MCG/ACT nasal spray          Review of Systems ROS: GEN: Acute illness details above GI: Tolerating PO intake GU: maintaining adequate hydration and urination Pulm: No SOB Interactive and getting along well at home.  Otherwise, ROS is as per the HPI.     Objective:   Physical Exam     Physical Exam  Blood pressure 100/70, pulse 84, temperature  98.4 F (36.9 C), temperature source Oral, height 5' 8.5" (1.74 m), weight 151 lb 6.4 oz (68.675 kg), SpO2 96.00%.  GEN: A and O x 3. WDWN. NAD.    ENT: Nose clear, ext NML.  No LAD.  No JVD.  TM's clear. Oropharynx clear.  PULM: Normal WOB, no distress. Decreased BS. No crackles, rare scattered wheezes CV: RRR, no M/G/R, No rubs, No JVD.   ABD: S, NT, ND, + BS. No rebound. No guarding. No HSM.   EXT: warm and well-perfused, No c/c/e. PSYCH: Pleasant and conversant.     Assessment & Plan:  Asthma exac and URI  Oral steroids Albuterol q 4 hours Cough meds prn

## 2011-05-07 ENCOUNTER — Encounter: Payer: Self-pay | Admitting: Family Medicine

## 2011-05-08 ENCOUNTER — Ambulatory Visit (INDEPENDENT_AMBULATORY_CARE_PROVIDER_SITE_OTHER): Payer: 59 | Admitting: Family Medicine

## 2011-05-08 ENCOUNTER — Encounter: Payer: Self-pay | Admitting: Family Medicine

## 2011-05-08 DIAGNOSIS — J069 Acute upper respiratory infection, unspecified: Secondary | ICD-10-CM

## 2011-05-08 MED ORDER — AZITHROMYCIN 250 MG PO TABS: ORAL_TABLET | ORAL | Status: AC

## 2011-05-08 NOTE — Progress Notes (Signed)
Prev chest sx better after prednisone. Was feeling well until recently:  Head congestion for about 1 week. HA, stuffy but no rhinorrhea. ST.  L ear pain.  +subjective fevers and chills.  Diffuse aches.  No cough.  Not sob.  No wheeze.  Meds, vitals, and allergies reviewed.   ROS: See HPI.  Otherwise, noncontributory.  GEN: nad, alert and oriented HEENT: mucous membranes moist, tm w/o erythema, nasal exam w/ mild erythema, clear discharge noted,  OP with mild cobblestoning, max>frontal sinuses mildly ttp NECK: supple w/o LA CV: rrr.   PULM: ctab, no inc wob EXT: no edema SKIN: no acute rash

## 2011-05-08 NOTE — Assessment & Plan Note (Signed)
She may have a sinus infection, but she is nontoxic and early in the process.  ddx d/w pt, viral vs. Bacterial.  Supportive tx, nasal steroids and hold zmax for now.  Use if sx>1 week.  She agrees.  Okay for outpatient fu. She understood.

## 2011-05-08 NOTE — Patient Instructions (Signed)
Keep using your regular meds and the nasal saline.  If you don't get better by Friday, then start the antibiotics.  Take care.   Glad to see you.

## 2011-05-17 ENCOUNTER — Encounter: Payer: Self-pay | Admitting: Family Medicine

## 2011-05-17 ENCOUNTER — Ambulatory Visit (INDEPENDENT_AMBULATORY_CARE_PROVIDER_SITE_OTHER): Payer: 59 | Admitting: Family Medicine

## 2011-05-17 DIAGNOSIS — R450 Nervousness: Secondary | ICD-10-CM | POA: Insufficient documentation

## 2011-05-17 DIAGNOSIS — R5383 Other fatigue: Secondary | ICD-10-CM | POA: Insufficient documentation

## 2011-05-17 DIAGNOSIS — E162 Hypoglycemia, unspecified: Secondary | ICD-10-CM | POA: Insufficient documentation

## 2011-05-17 NOTE — Progress Notes (Signed)
  Subjective:    Patient ID: Anna [name], female    DOB: July 04, 1971, 40 y.o.   MRN: [id number]  HPI  40 year old female presents with 1 and 11/2 weeks ago feels shaking through body, in episodes, daily. Notes tremor. Occurs around 9 Am or 10 in AM better after eating lunch.  Dull headache for 2 weeks... Headaches. Tried imitrex but it has not helped a t all.  Fatigue and weakness.  Tylenol or advil does not help.  She has anxiety when having the issue... Not anxious or depressed otherwise.  No CP, no SOB, no palpitations, no abdominal pain, no D/no vomting. Did have fairly heavy period recently. Drinking a lot of water.   Husband is Company secretary. HR at home is in normal range. Blood sugar 83, 80...but usually eating things after.  Had recent 6/18 asthma exacerbation.. Given 10 day course of prednsione.  Asthma flare improved now. Not using albuterol much recently.  Recent Z-pack for sinus infection... But no improvement in headhache. Some congestion.    Review of Systems  Constitutional: Negative for fever and fatigue.  HENT: Negative for ear pain.   Eyes: Negative for pain.  Respiratory: Negative for chest tightness and shortness of breath.   Cardiovascular: Negative for chest pain, palpitations and leg swelling.  Gastrointestinal: Negative for abdominal pain.  Genitourinary: Negative for dysuria.       Objective:   Physical Exam  Constitutional: Vital signs are normal. She appears well-developed and well-nourished. She is cooperative.  Non-toxic appearance. She does not appear ill. No distress.  HENT:  Head: Normocephalic.  Right Ear: Hearing, tympanic membrane, external ear and ear canal normal. Tympanic membrane is not erythematous, not retracted and not bulging.  Left Ear: Hearing, tympanic membrane, external ear and ear canal normal. Tympanic membrane is not erythematous, not retracted and not bulging.  Nose: No mucosal edema or rhinorrhea. Right sinus exhibits no  maxillary sinus tenderness and no frontal sinus tenderness. Left sinus exhibits no maxillary sinus tenderness and no frontal sinus tenderness.  Mouth/Throat: Uvula is midline, oropharynx is clear and moist and mucous membranes are normal.  Eyes: Conjunctivae, EOM and lids are normal. Pupils are equal, round, and reactive to light. No foreign bodies found.  Neck: Trachea normal and normal range of motion. Neck supple. Carotid bruit is not present. No mass and no thyromegaly present.  Cardiovascular: Normal rate, regular rhythm, S1 normal, S2 normal, normal heart sounds, intact distal pulses and normal pulses.  Exam reveals no gallop and no friction rub.   No murmur heard. Pulmonary/Chest: Effort normal and breath sounds normal. Not tachypneic. No respiratory distress. She has no decreased breath sounds. She has no wheezes. She has no rhonchi. She has no rales.  Abdominal: Soft. Normal appearance and bowel sounds are normal. There is no tenderness.  Neurological: She is alert.  Skin: Skin is warm, dry and intact. No rash noted.  Psychiatric: Her speech is normal and behavior is normal. Judgment and thought content normal. Her mood appears not anxious. Cognition and memory are normal. She does not exhibit a depressed mood.          Assessment & Plan:

## 2011-05-17 NOTE — Patient Instructions (Signed)
Check blood pressure at home when having episode... Call if greater than 140/90. We will call you with labs results.  let us know if symtpoms changing or worsening.  Try to eat 5 small meals a day, increase protein and fiber in each meal.

## 2011-05-17 NOTE — Progress Notes (Signed)
Addended by: Alvina Chou on: 05/17/2011 04:48 PM   Modules accepted: Orders

## 2011-05-17 NOTE — Assessment & Plan Note (Signed)
?   Due to anxiety or other secondary cause like hypoglycemia. She feels it is likely not anxiety/stress. Eval with labs. In meantime eat 5 small meals a day high in protein and fiber.

## 2011-05-17 NOTE — Assessment & Plan Note (Signed)
Eval further with labs.  

## 2011-05-18 LAB — CBC WITH DIFFERENTIAL/PLATELET
Basophils Relative: 0 % (ref 0–1)
Eosinophils Absolute: 0.3 10*3/uL (ref 0.0–0.7)
HCT: 43.1 % (ref 36.0–46.0)
Hemoglobin: 14.7 g/dL (ref 12.0–15.0)
MCH: 31.7 pg (ref 26.0–34.0)
MCHC: 34.1 g/dL (ref 30.0–36.0)
Monocytes Absolute: 0.6 10*3/uL (ref 0.1–1.0)
Monocytes Relative: 8 % (ref 3–12)
Neutrophils Relative %: 48 % (ref 43–77)

## 2011-05-18 LAB — VITAMIN B12: Vitamin B-12: 658 pg/mL (ref 211–911)

## 2011-05-18 LAB — COMPREHENSIVE METABOLIC PANEL
AST: 23 U/L (ref 0–37)
Albumin: 4.5 g/dL (ref 3.5–5.2)
Alkaline Phosphatase: 48 U/L (ref 39–117)
BUN: 16 mg/dL (ref 6–23)
Potassium: 4.1 mEq/L (ref 3.5–5.3)
Sodium: 138 mEq/L (ref 135–145)

## 2011-05-22 ENCOUNTER — Telehealth: Payer: Self-pay | Admitting: *Deleted

## 2011-05-22 ENCOUNTER — Other Ambulatory Visit: Payer: 59

## 2011-05-22 DIAGNOSIS — R450 Nervousness: Secondary | ICD-10-CM

## 2011-05-22 NOTE — Telephone Encounter (Signed)
Patient advised and appt made for urine test

## 2011-05-22 NOTE — Telephone Encounter (Signed)
Pt called to report that he BP yesterday was 150/110, 150/120, 150/104 yesterday and 140/70 this morning. She says she has not been feeling well and you have been trying to determine what could be wrong

## 2011-05-22 NOTE — Telephone Encounter (Signed)
The elevated BP is during episodes? Or all day? We will need her to come in for urine testing for a adenal issue called pheochromocytoma that we talked about at last OV.  No tea  Prior to test.

## 2011-05-24 ENCOUNTER — Other Ambulatory Visit (INDEPENDENT_AMBULATORY_CARE_PROVIDER_SITE_OTHER): Payer: 59 | Admitting: Family Medicine

## 2011-05-24 DIAGNOSIS — R450 Nervousness: Secondary | ICD-10-CM

## 2011-05-24 DIAGNOSIS — R030 Elevated blood-pressure reading, without diagnosis of hypertension: Secondary | ICD-10-CM

## 2011-05-29 LAB — VMA, URINE, 24 HOUR
Creatinine 24h urine: 1.58 g/(24.h) (ref 0.63–2.50)
Vanillylmandelic Acid, (VMA): 3.7 mg/24 h (ref <=6.0)

## 2011-05-29 LAB — CATECHOLAMINES, FRACTIONATED, URINE, 24 HOUR: Total Volume - CF 24Hr U: 2300 mL

## 2011-05-31 ENCOUNTER — Telehealth: Payer: Self-pay | Admitting: *Deleted

## 2011-05-31 ENCOUNTER — Telehealth: Payer: Self-pay | Admitting: Family Medicine

## 2011-05-31 ENCOUNTER — Encounter: Payer: Self-pay | Admitting: Family Medicine

## 2011-05-31 NOTE — Telephone Encounter (Signed)
Patient is asking if results from recent urine test has arrived?

## 2011-05-31 NOTE — Telephone Encounter (Signed)
See lab note.  

## 2011-05-31 NOTE — Telephone Encounter (Signed)
Notify pt that VMA and catecholamine test for adrenal tumor are 100% normal.   If continued symptoms we can consider having her see and endocrinologist for further eval.

## 2011-06-03 NOTE — Telephone Encounter (Signed)
Spoke to patient and she said that she thought the next step would be treating anxiety and stress

## 2011-06-04 NOTE — Telephone Encounter (Signed)
I she feels that anxiety and stress are liekly than we can go ahead and treat these. My only thought about the endo referral was given her concern for low blood sugars and how to avoid this. Have her make an appt to discuss medication and counseling for anxiety.

## 2011-06-05 NOTE — Telephone Encounter (Signed)
Patient advised and appt made

## 2011-06-11 ENCOUNTER — Ambulatory Visit (INDEPENDENT_AMBULATORY_CARE_PROVIDER_SITE_OTHER): Payer: 59 | Admitting: Family Medicine

## 2011-06-11 ENCOUNTER — Encounter: Payer: Self-pay | Admitting: Family Medicine

## 2011-06-11 ENCOUNTER — Ambulatory Visit: Payer: 59 | Admitting: Family Medicine

## 2011-06-11 DIAGNOSIS — R450 Nervousness: Secondary | ICD-10-CM

## 2011-06-11 DIAGNOSIS — J309 Allergic rhinitis, unspecified: Secondary | ICD-10-CM

## 2011-06-11 NOTE — Progress Notes (Signed)
  Subjective:    Patient ID: Anna [name], female    DOB: [birth year], 40 y.o.   MRN: [id number]  HPI  40 year old female seen in past few weeks for jittery episodes, panicky feelings. Had some associated elevated BP during episodes.  Labs eval negative.  Catecholamine/VMA urine eval negative.  She continues to have these episodes, although they have been better.  She states she is under stress, and did feel very anxious during the episodes. She does feeel a continue sense of generalized nervousness. She has started [employer] Wort in last 4 days, but she does feel bettter.    Review of Systems     Objective:   Physical Exam        Assessment & Plan:  No charge for pt as she had to be rescheduled from this AM and she was already here, missed work twice today, other issues etc.

## 2011-06-11 NOTE — Assessment & Plan Note (Signed)
Most likely due to anxiety... She has started [employer]  Wort. Sh eis feeling better overall. She will continue to see how she does on this med in next 304 weeks. Will hold off on prescription anxiolytic for now. She will calll if needed.. Start zoloft then. Not interested in counseling but will work on stress reduction and relaxation.

## 2011-06-11 NOTE — Assessment & Plan Note (Signed)
Poor control and frequent sinus infections...will try trial of veramyst, continue other meds. Increase to 2 sprays per nostril daily.

## 2011-06-11 NOTE — Patient Instructions (Signed)
Trial of veramyst for allergies. Continue clarithromycin for current sinus infection.

## 2011-07-22 ENCOUNTER — Other Ambulatory Visit: Payer: Self-pay | Admitting: *Deleted

## 2011-07-22 MED ORDER — SUMATRIPTAN SUCCINATE 100 MG PO TABS: 100.0000 mg | ORAL_TABLET | ORAL | Status: DC | PRN

## 2011-07-24 ENCOUNTER — Other Ambulatory Visit: Payer: Self-pay | Admitting: *Deleted

## 2011-07-24 NOTE — Telephone Encounter (Signed)
ERROR

## 2011-08-15 ENCOUNTER — Ambulatory Visit (INDEPENDENT_AMBULATORY_CARE_PROVIDER_SITE_OTHER): Payer: 59

## 2011-08-15 DIAGNOSIS — Z23 Encounter for immunization: Secondary | ICD-10-CM

## 2011-08-29 ENCOUNTER — Ambulatory Visit: Payer: 59

## 2011-08-29 DIAGNOSIS — K5792 Diverticulitis of intestine, part unspecified, without perforation or abscess without bleeding: Secondary | ICD-10-CM

## 2011-08-29 DIAGNOSIS — N809 Endometriosis, unspecified: Secondary | ICD-10-CM

## 2011-08-29 HISTORY — DX: Endometriosis, unspecified: N80.9

## 2011-08-29 HISTORY — DX: Diverticulitis of intestine, part unspecified, without perforation or abscess without bleeding: K57.92

## 2011-09-08 ENCOUNTER — Emergency Department (HOSPITAL_COMMUNITY)
Admission: EM | Admit: 2011-09-08 | Discharge: 2011-09-08 | Disposition: A | Discharge status: Home / Self Care | Payer: No Typology Code available for payment source | Attending: Emergency Medicine | Admitting: Emergency Medicine

## 2011-09-08 ENCOUNTER — Emergency Department (HOSPITAL_COMMUNITY): Payer: No Typology Code available for payment source

## 2011-09-08 ENCOUNTER — Encounter (HOSPITAL_COMMUNITY): Payer: Self-pay | Admitting: *Deleted

## 2011-09-08 DIAGNOSIS — M542 Cervicalgia: Secondary | ICD-10-CM | POA: Insufficient documentation

## 2011-09-08 DIAGNOSIS — T148XXA Other injury of unspecified body region, initial encounter: Secondary | ICD-10-CM | POA: Insufficient documentation

## 2011-09-08 DIAGNOSIS — Y92410 Unspecified street and highway as the place of occurrence of the external cause: Secondary | ICD-10-CM | POA: Insufficient documentation

## 2011-09-08 MED ORDER — HYDROCODONE-ACETAMINOPHEN 5-325 MG PO TABS
1.0000 | ORAL_TABLET | Freq: Once | ORAL | Status: AC
  Administered 2011-09-08: 1 via ORAL
  Filled 2011-09-08: qty 1

## 2011-09-08 MED ORDER — IBUPROFEN 200 MG PO TABS
600.0000 mg | ORAL_TABLET | Freq: Once | ORAL | Status: AC
  Administered 2011-09-08: 600 mg via ORAL
  Filled 2011-09-08: qty 3

## 2011-09-08 MED ORDER — IBUPROFEN 600 MG PO TABS: 600.0000 mg | ORAL_TABLET | Freq: Four times a day (QID) | ORAL | Status: AC | PRN

## 2011-09-08 NOTE — ED Notes (Signed)
Pt. Indicated the need to urinate.  Explained that we would not be able to move her off the spinal board until a practitioner came to examine her.  Pt. Indicated understanding.  RN aware.
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
## 2011-09-23 ENCOUNTER — Other Ambulatory Visit: Payer: 59

## 2011-09-27 ENCOUNTER — Encounter: Payer: 59 | Admitting: Family Medicine

## 2011-10-18 ENCOUNTER — Encounter: Payer: Self-pay | Admitting: Family Medicine

## 2011-10-18 ENCOUNTER — Ambulatory Visit (INDEPENDENT_AMBULATORY_CARE_PROVIDER_SITE_OTHER): Payer: 59 | Admitting: Family Medicine

## 2011-10-18 VITALS — BP 110/70 | HR 72 | Temp 97.9°F | Ht 68.0 in | Wt 154.0 lb

## 2011-10-18 DIAGNOSIS — J329 Chronic sinusitis, unspecified: Secondary | ICD-10-CM

## 2011-10-18 MED ORDER — FLUCONAZOLE 150 MG PO TABS: 150.0000 mg | ORAL_TABLET | Freq: Once | ORAL | Status: DC

## 2011-10-18 MED ORDER — AZITHROMYCIN 250 MG PO TABS: ORAL_TABLET | ORAL | Status: AC

## 2011-10-18 NOTE — Progress Notes (Signed)
Mult sick contacts at home with walking PNA and strep.  ST intermittently for 3 weeks .  She's had no cough, but green rhinorrhea for a few days.  Bad taste in mouth.  She's on inhalers, but those predate the taste changes but a significant period of time.  No fevers.  Facial pain going on for about 1 week.    Meds, vitals, and allergies reviewed.   ROS: See HPI.  Otherwise, noncontributory.  GEN: nad, alert and oriented, nontoxic HEENT: mucous membranes moist, tm w/o erythema, nasal exam w/o erythema, clear discharge noted,  OP with cobblestoning, Sinus ttp x4 NECK: supple w/o LA CV: rrr.   PULM: ctab, no inc wob EXT: no edema SKIN: no acute rash

## 2011-10-18 NOTE — Patient Instructions (Signed)
Start the antibiotics today, use the diflucan if needed and try to get some rest.  Take care.  Glad to see you.

## 2011-10-20 ENCOUNTER — Encounter: Payer: Self-pay | Admitting: Family Medicine

## 2011-10-20 DIAGNOSIS — J329 Chronic sinusitis, unspecified: Secondary | ICD-10-CM | POA: Insufficient documentation

## 2011-10-20 NOTE — Assessment & Plan Note (Signed)
Given the exam start abx, supportive tx o/w and f/u prn.  Nontoxic.  D/w pt.

## 2011-11-02 IMAGING — CT CT ABD-PELV W/ CM
2 of 4 series · 15 of 46 positions shown, 17 images · IV contrast (APPLIED)
Comparison: None.

CLINICAL DATA: Abdominal pain, fever and elevated white blood cell
count.  Pain has had a diffuse component but is more localized to
the left lower quadrant.  History of endometriosis.

CT ABDOMEN AND PELVIS WITH CONTRAST
TECHNIQUE: Multidetector CT imaging of the abdomen and pelvis was
performed following the standard protocol during bolus
administration of intravenous contrast.
Contrast: 100 ml Imnipaque-IQQ IV

[Series 2: abd/pelvis 5.0 b31f · axial · 0.73mm/px · z∈[-481,-76]mm · 12 of 89 slices shown, 14 images]
[im 4/89  soft-tissue]
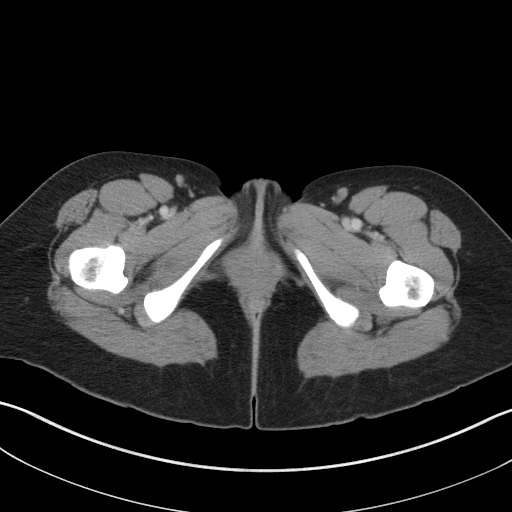
[im 4/89  bone]
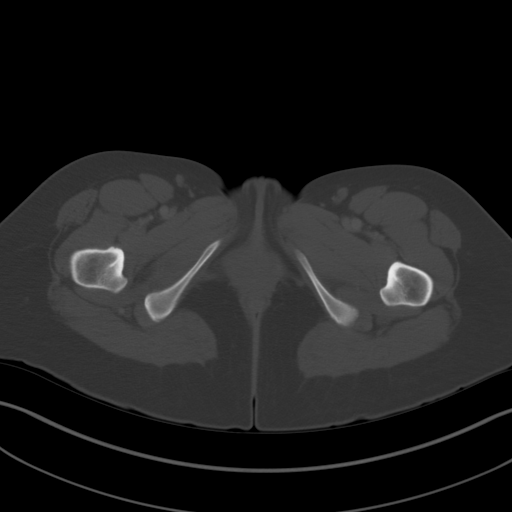
[im 12/89  soft-tissue]
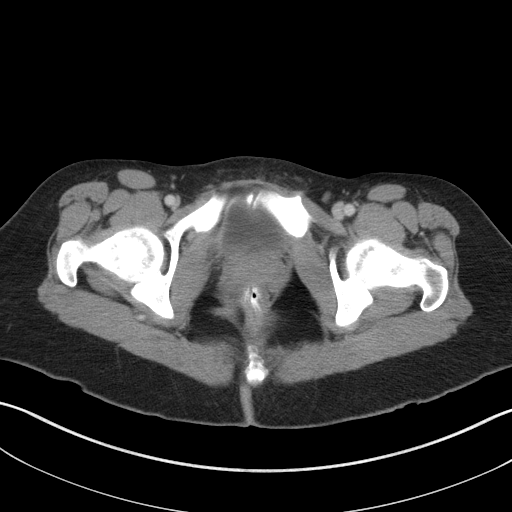
[im 19/89  soft-tissue]
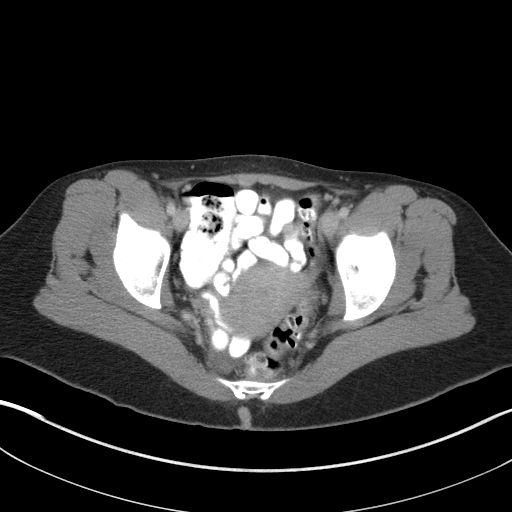
[im 26/89  soft-tissue]
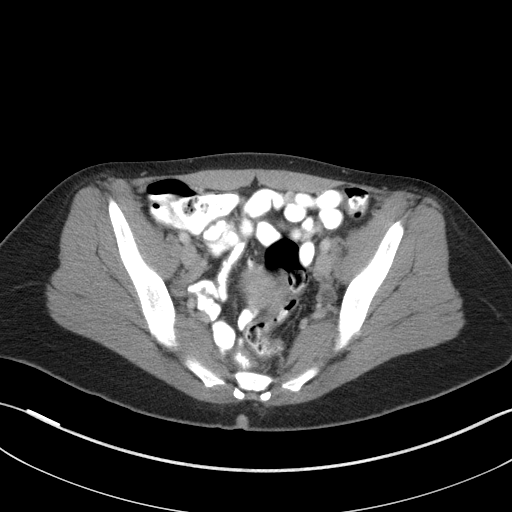
[im 34/89  soft-tissue]
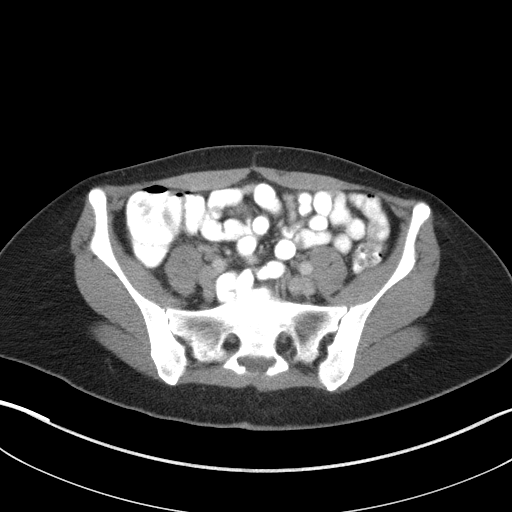
[im 41/89  soft-tissue]
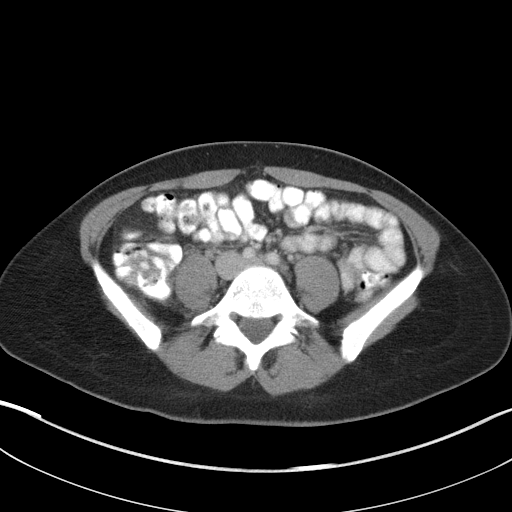
[im 48/89  soft-tissue]
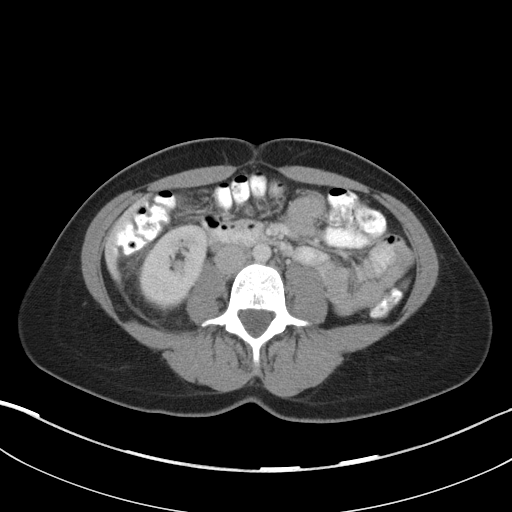
[im 56/89  soft-tissue]
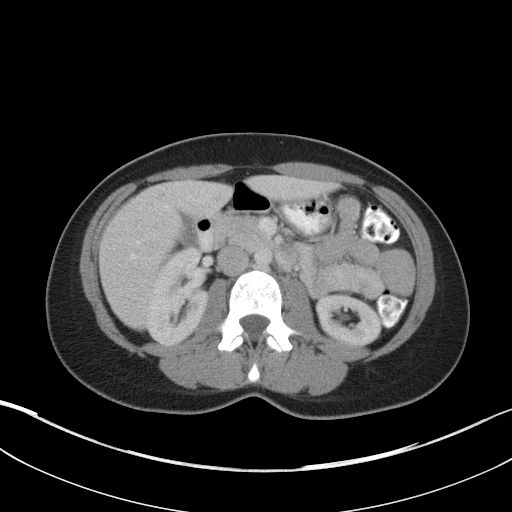
[im 63/89  soft-tissue]
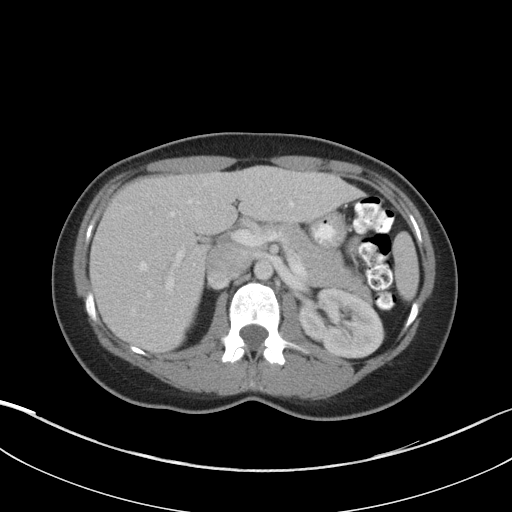
[im 63/89  bone]
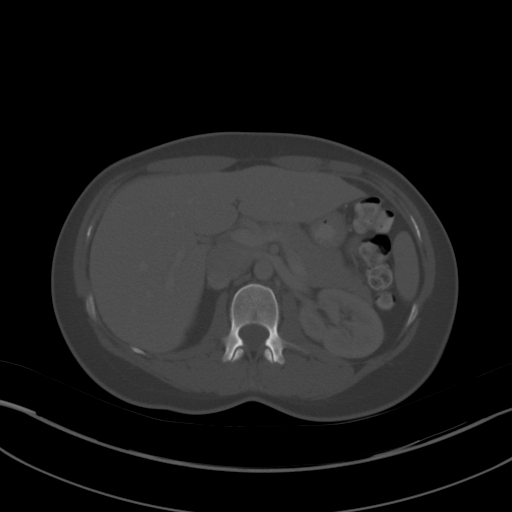
[im 70/89  soft-tissue]
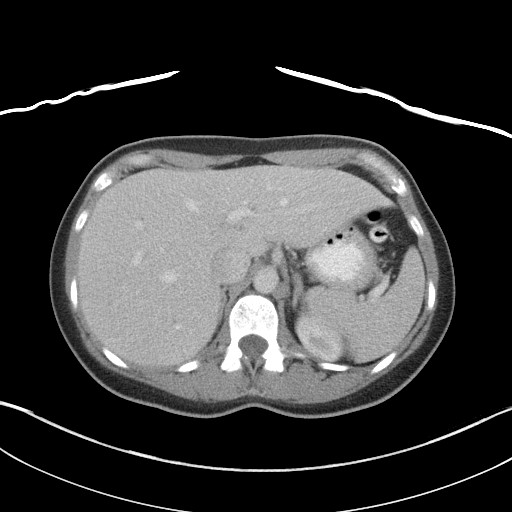
[im 78/89  soft-tissue]
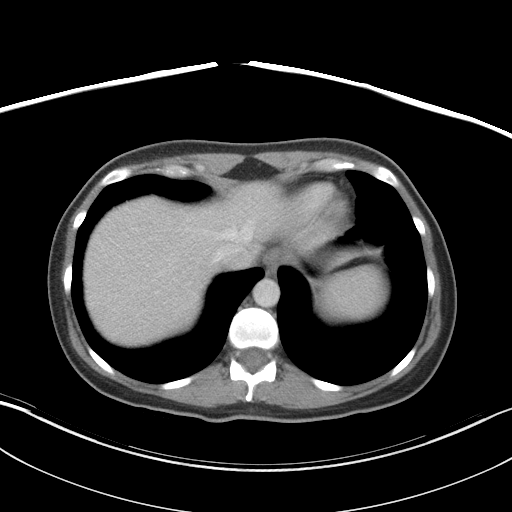
[im 85/89  soft-tissue]
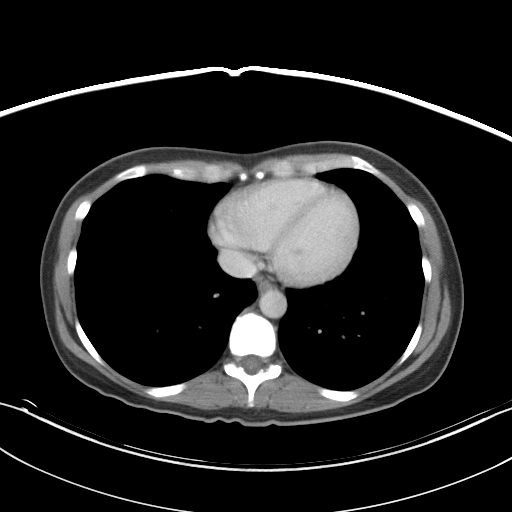

[Series 5: abd/pelvis 3.0 coronal · coronal · 0.76mm/px · 3 of 67 slices shown]
[im 23/67  soft-tissue]
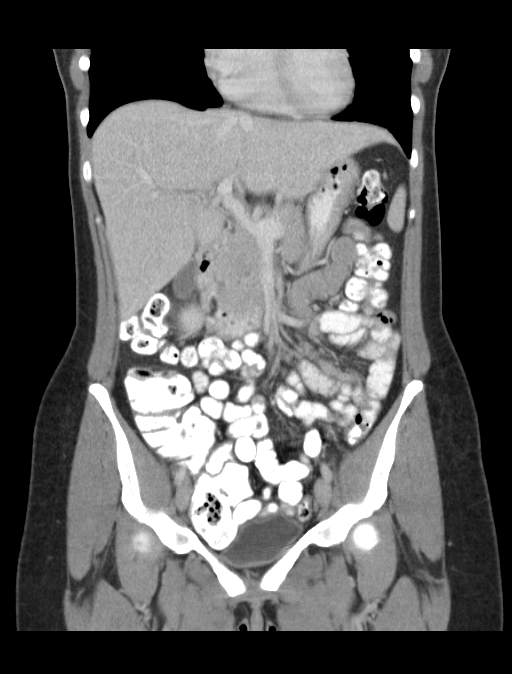
[im 30/67  soft-tissue]
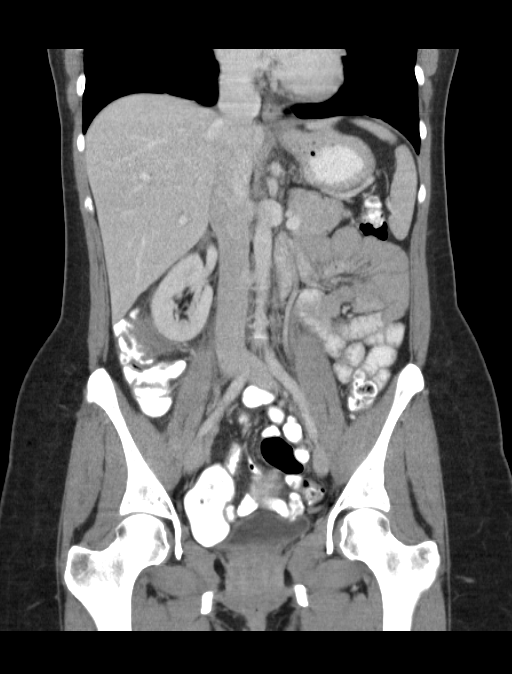
[im 37/67  soft-tissue]
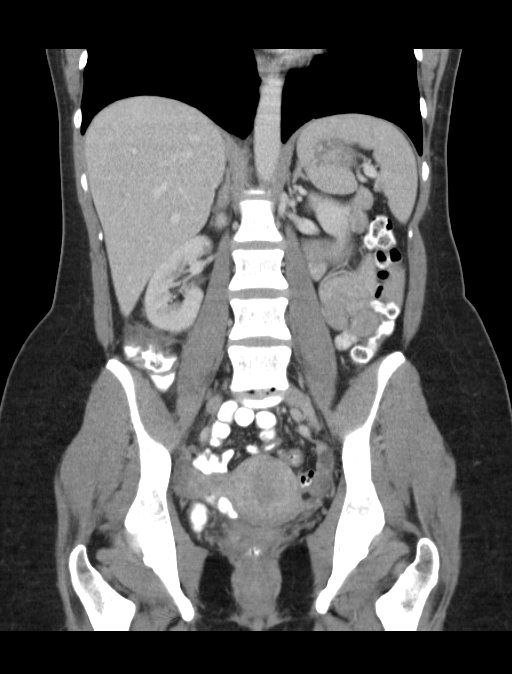

[15 of 46 positions shown; findings below may reference images not displayed]

FINDINGS: Abnormal soft tissue present at the level of the
ascending colon just above the iliac crest.  In this region, there
is soft tissue thickening along the mesenteric side of the colon
extending into the lumen of the colon.  The total area of soft
tissue abnormality measures roughly 4 x 5 cm.  Soft tissue also
extends into the adjacent mesenteric fat.  This is worrisome for
potential colonic neoplasm.  However, an unusual inflammatory
process could also conceivably have this appearance such as focal
diverticulitis or colitis.  Endometriosis causing such a soft
tissue abnormality would be unusual.

There is no evidence of free air or abscess.  Soft tissue that
extends into the lumen of the colon does not cause obstruction.  No
associated obviously enlarged lymph nodes are seen.

The liver, gallbladder, pancreas, spleen, adrenal glands and
kidneys are unremarkable.  Mesenteric soft tissue abnormality in
the right abdomen does nearly contact the lower pole of the right
kidney.  However, renal etiology of the abnormality is not
suspected.

Small amount of free fluid is present in the cul-de-sac.  The
uterus, adnexal regions and bladder are unremarkable by CT.  No
hernias.  No abnormal calcifications.  Bony structures are
unremarkable.
IMPRESSION: Abnormal soft tissue in the right abdomen that abuts the mesenteric
side of the ascending colon and causes fairly extensive surrounding
soft tissue abnormality with visible tissue extending into the
lumen of the ascending colon.  Primary concern is for colonic
neoplasm.  Given the surrounding soft tissue abnormality as well as
more acute development of elevated white blood cell count and
fever, focal perforation of a neoplasm cannot be excluded.  Other
differential considerations include atypical appearance of right-
sided diverticulitis or colitis.  No associated abscess, free air
or bowel obstruction.  Findings were discussed directly with Dr.
Hayaler.

## 2011-11-25 DIAGNOSIS — N898 Other specified noninflammatory disorders of vagina: Secondary | ICD-10-CM

## 2011-11-25 HISTORY — DX: Other specified noninflammatory disorders of vagina: N89.8

## 2012-02-05 ENCOUNTER — Other Ambulatory Visit: Payer: Self-pay | Admitting: *Deleted

## 2012-02-05 MED ORDER — MOMETASONE FUROATE 50 MCG/ACT NA SUSP: 2.0000 | Freq: Every day | NASAL | Status: DC

## 2012-02-06 ENCOUNTER — Telehealth: Payer: Self-pay | Admitting: Obstetrics and Gynecology

## 2012-02-06 NOTE — Telephone Encounter (Signed)
Returned pt's call. Sched lab appt for Depot Lupron.

## 2012-02-14 ENCOUNTER — Other Ambulatory Visit (INDEPENDENT_AMBULATORY_CARE_PROVIDER_SITE_OTHER): Payer: 59

## 2012-02-14 ENCOUNTER — Other Ambulatory Visit: Payer: Self-pay

## 2012-02-14 DIAGNOSIS — N809 Endometriosis, unspecified: Secondary | ICD-10-CM

## 2012-02-14 MED ORDER — LEUPROLIDE ACETATE (3 MONTH) 11.25 MG IM KIT
11.2500 mg | PACK | Freq: Once | INTRAMUSCULAR | Status: AC
  Administered 2012-02-14: 11.25 mg via INTRAMUSCULAR

## 2012-02-14 MED ORDER — LEUPROLIDE ACETATE (3 MONTH) 11.25 MG IM KIT: 11.2500 mg | PACK | INTRAMUSCULAR | Status: DC

## 2012-03-03 ENCOUNTER — Other Ambulatory Visit: Payer: Self-pay | Admitting: Family Medicine

## 2012-03-23 ENCOUNTER — Telehealth: Payer: Self-pay | Admitting: Family Medicine

## 2012-03-23 DIAGNOSIS — Z13220 Encounter for screening for lipoid disorders: Secondary | ICD-10-CM

## 2012-03-23 NOTE — Telephone Encounter (Signed)
Message copied by Excell Seltzer on Mon Mar 23, 2012  1:14 AM ------      Message from: [place], [place] J      Created: Wed Mar 18, 2012 10:25 AM      Regarding: labs for Tues 5-28       Patient is scheduled for CPX labs, please order future labs, Thanks , Camelia Eng

## 2012-03-24 ENCOUNTER — Other Ambulatory Visit (INDEPENDENT_AMBULATORY_CARE_PROVIDER_SITE_OTHER): Payer: 59

## 2012-03-24 DIAGNOSIS — Z13220 Encounter for screening for lipoid disorders: Secondary | ICD-10-CM

## 2012-03-24 LAB — COMPREHENSIVE METABOLIC PANEL
Albumin: 3.9 g/dL (ref 3.5–5.2)
CO2: 28 mEq/L (ref 19–32)
GFR: 81.24 mL/min (ref >60.00)
Glucose, Bld: 89 mg/dL (ref 70–99)
Potassium: 4.1 mEq/L (ref 3.5–5.1)
Sodium: 138 mEq/L (ref 135–145)
Total Protein: 6.8 g/dL (ref 6.0–8.3)

## 2012-03-24 LAB — LIPID PANEL: Cholesterol: 174 mg/dL (ref 0–200)

## 2012-03-27 ENCOUNTER — Ambulatory Visit (INDEPENDENT_AMBULATORY_CARE_PROVIDER_SITE_OTHER): Payer: 59 | Admitting: Family Medicine

## 2012-03-27 ENCOUNTER — Encounter: Payer: Self-pay | Admitting: Family Medicine

## 2012-03-27 VITALS — BP 90/60 | HR 80 | Temp 97.9°F | Wt 147.0 lb

## 2012-03-27 DIAGNOSIS — J4540 Moderate persistent asthma, uncomplicated: Secondary | ICD-10-CM

## 2012-03-27 DIAGNOSIS — N801 Endometriosis of ovary: Secondary | ICD-10-CM

## 2012-03-27 DIAGNOSIS — Z23 Encounter for immunization: Secondary | ICD-10-CM

## 2012-03-27 DIAGNOSIS — J45909 Unspecified asthma, uncomplicated: Secondary | ICD-10-CM

## 2012-03-27 DIAGNOSIS — G43009 Migraine without aura, not intractable, without status migrainosus: Secondary | ICD-10-CM

## 2012-03-27 MED ORDER — MONTELUKAST SODIUM 10 MG PO TABS: 10.0000 mg | ORAL_TABLET | Freq: Every day | ORAL | Status: DC

## 2012-03-27 MED ORDER — SUMATRIPTAN SUCCINATE 100 MG PO TABS: 100.0000 mg | ORAL_TABLET | ORAL | Status: DC | PRN

## 2012-03-27 NOTE — Progress Notes (Signed)
  Subjective:    Patient ID: Anna [name], female    DOB: 1971/02/15, 41 y.o.   MRN: [id number]  HPI  Sees GYN. for endometriosis. She is on depro lupron in last 6 months. She has had a lot of benefit oin abdominal pain. Last pelvic exam: 06/2011  Jiiterness last year.. No further issues. May ave been secondary to steroids.  Moderate persistant asthma: has not used inhaler in last 3-4 months until today when was around a smell. On Qvar.  Migraines once every 4 months.  Review of Systems  Constitutional: Negative for fever, fatigue and unexpected weight change.  HENT: Negative for ear pain, congestion, sore throat, sneezing, trouble swallowing and sinus pressure.   Eyes: Negative for pain and itching.  Respiratory: Negative for cough, shortness of breath and wheezing.   Cardiovascular: Negative for chest pain, palpitations and leg swelling.  Gastrointestinal: Negative for nausea, abdominal pain, diarrhea, constipation and blood in stool.  Genitourinary: Negative for dysuria, hematuria, vaginal discharge, difficulty urinating and menstrual problem.  Skin: Negative for rash.  Neurological: Negative for syncope, weakness, light-headedness, numbness and headaches.  Psychiatric/Behavioral: Negative for confusion and dysphoric mood. The patient is not nervous/anxious.        Objective:   Physical Exam  Constitutional: Vital signs are normal. She appears well-developed and well-nourished. She is cooperative.  Non-toxic appearance. She does not appear ill. No distress.  HENT:  Head: Normocephalic.  Right Ear: Hearing, tympanic membrane, external ear and ear canal normal.  Left Ear: Hearing, tympanic membrane, external ear and ear canal normal.  Nose: Nose normal.  Eyes: Conjunctivae, EOM and lids are normal. Pupils are equal, round, and reactive to light. No foreign bodies found.  Neck: Trachea normal and normal range of motion. Neck supple. Carotid bruit is not present. No mass and no  thyromegaly present.  Cardiovascular: Normal rate, regular rhythm, S1 normal, S2 normal, normal heart sounds and intact distal pulses.  Exam reveals no gallop.   No murmur heard. Pulmonary/Chest: Effort normal and breath sounds normal. No respiratory distress. She has no wheezes. She has no rhonchi. She has no rales.  Abdominal: Soft. Normal appearance and bowel sounds are normal. She exhibits no distension, no fluid wave, no abdominal bruit and no mass. There is no hepatosplenomegaly. There is no tenderness. There is no rebound, no guarding and no CVA tenderness. No hernia.  Lymphadenopathy:    She has no cervical adenopathy.    She has no axillary adenopathy.  Neurological: She is alert. She has normal strength. No cranial nerve deficit or sensory deficit.  Skin: Skin is warm, dry and intact. No rash noted.  Psychiatric: Her speech is normal and behavior is normal. Judgment normal. Her mood appears not anxious. Cognition and memory are normal. She does not exhibit a depressed mood.          Assessment & Plan:  The patient's preventative maintenance and recommended screening tests for an annual wellness exam were reviewed in full today. Brought up to date unless services declined.  Counselled on the importance of diet, exercise, and its role in overall health and mortality. The patient's FH and SH was reviewed, including their home life, tobacco status, and drug and alcohol status.   Vaccines: Due for tdap PAP/DVE: per GYN 06/2011. Mammo: not indicated. Consider DEXA given on depo lupron.

## 2012-03-27 NOTE — Patient Instructions (Addendum)
Look into TDap (tetanus), call if interested in getting.  Continue working on health lifestyle.

## 2012-03-27 NOTE — Assessment & Plan Note (Signed)
Well controlled. Using imitrex prn.

## 2012-03-27 NOTE — Assessment & Plan Note (Signed)
Well controlled on Singulair and Qvar.

## 2012-04-20 ENCOUNTER — Telehealth: Payer: Self-pay | Admitting: Obstetrics and Gynecology

## 2012-04-20 DIAGNOSIS — N809 Endometriosis, unspecified: Secondary | ICD-10-CM

## 2012-04-20 NOTE — Telephone Encounter (Signed)
Triage/script

## 2012-04-20 NOTE — Telephone Encounter (Signed)
Spoke with pt Anna [name] msg pt states need depo lupron ordered pt states going on vacation next week will need to get shot 05/08/12 advised pt SR assistant out of office today but wil consult with her tomorrow pt voice understanding

## 2012-04-21 MED ORDER — LEUPROLIDE ACETATE (3 MONTH) 11.25 MG IM KIT: 11.2500 mg | PACK | INTRAMUSCULAR | Status: DC

## 2012-04-21 NOTE — Telephone Encounter (Signed)
lmtc @ 4:15  ld

## 2012-04-21 NOTE — Telephone Encounter (Signed)
Process started for DepoLupron 11.25 order. Rx sent to

## 2012-04-21 NOTE — Addendum Note (Signed)
Addended by: Larwance Rote on: 04/21/2012 04:56 PM   Modules accepted: Orders

## 2012-04-21 NOTE — Telephone Encounter (Signed)
Rx request sent to [facility] mail order.  ld

## 2012-04-28 ENCOUNTER — Telehealth: Payer: Self-pay | Admitting: Family Medicine

## 2012-04-28 NOTE — Telephone Encounter (Signed)
Triage Record Num: [id number] Operator: Reino Bellis Patient Name: Anna [name] Call Date & Time: 04/27/2012 8:37:51PM Patient Phone: [phone] PCP: Kerby Nora Patient Gender: Female PCP Fax : [phone] Patient DOB: 03-22-1971 Practice Name: [facility] Reason for Call: Caller: [name]/Patient; PCP: Excell Seltzer.; CB#: [phone]; Call regarding Cough/Congestion; LMP: "a while" because she is on the Depo Lupron shot. Pt. calling about nasal congestion. Onset: 04/13/12. Pt. reports she has developed fever, chills, nonproductive cough, and body aches over the past few days. Unable to take temperature because she is currently out of town and does not have a thermometer. Emergent sxs of new or worsening breathing problems that have not been evaluated positive per Breathing Problems Protocol. Advised pt. to go to local ED for evaluation. Care advice given. Pt. verbalizes understanding. Protocol(s) Used: Breathing Problems Recommended Outcome per Protocol: See ED Immediately Reason for Outcome: New or worsening breathing problems that have not been evaluated Care Advice: ~ Another adult should drive. ~ IMMEDIATE ACTION Write down provider's name. List or place the following in a bag for transport with the patient: current prescription and/or nonprescription medications; alternative treatments, therapies and medications; and street drugs. ~ May have clear liquids (such as water, clear fruit juices without pulp, soda, tea or coffee without dairy or non-dairy creamer, clear broth or bouillon, oral hydration solution, or plain gelatin, fruit ices/popsicles, hard candy) but do not eat solid foods before being seen by your provider. ~ 04/27/2012 8:56:22PM Page 1 of 1 [id number]

## 2012-05-01 ENCOUNTER — Telehealth: Payer: Self-pay

## 2012-05-01 NOTE — Telephone Encounter (Signed)
Depo Lupron under review by insurance company.  Last 3 visits faxed to [facility] at [phone] in order to have injection ready.  Due 05-08-12.  ld

## 2012-05-04 ENCOUNTER — Telehealth: Payer: Self-pay | Admitting: Obstetrics and Gynecology

## 2012-05-05 ENCOUNTER — Ambulatory Visit (INDEPENDENT_AMBULATORY_CARE_PROVIDER_SITE_OTHER): Payer: 59 | Admitting: Family Medicine

## 2012-05-05 ENCOUNTER — Ambulatory Visit: Payer: 59 | Admitting: Family Medicine

## 2012-05-05 ENCOUNTER — Telehealth: Payer: Self-pay

## 2012-05-05 ENCOUNTER — Ambulatory Visit (INDEPENDENT_AMBULATORY_CARE_PROVIDER_SITE_OTHER)
Admission: RE | Admit: 2012-05-05 | Discharge: 2012-05-05 | Disposition: A | Discharge status: Home / Self Care | Payer: 59 | Source: Ambulatory Visit | Attending: Family Medicine | Admitting: Family Medicine

## 2012-05-05 ENCOUNTER — Encounter: Payer: Self-pay | Admitting: Family Medicine

## 2012-05-05 VITALS — BP 120/70 | HR 76 | Temp 97.9°F | Ht 68.0 in | Wt 147.5 lb

## 2012-05-05 DIAGNOSIS — R059 Cough, unspecified: Secondary | ICD-10-CM

## 2012-05-05 DIAGNOSIS — R1032 Left lower quadrant pain: Secondary | ICD-10-CM

## 2012-05-05 DIAGNOSIS — R05 Cough: Secondary | ICD-10-CM

## 2012-05-05 DIAGNOSIS — J45901 Unspecified asthma with (acute) exacerbation: Secondary | ICD-10-CM

## 2012-05-05 MED ORDER — PREDNISONE 10 MG PO TABS: ORAL_TABLET | ORAL | Status: DC

## 2012-05-05 NOTE — Patient Instructions (Addendum)
Give antibiotics longer time to work. We will call you with X-ray results. Use albuterol as needed. Continue QVAR, can try increasing to 2 puff twice a day. If not improving in next 24-48 hours fill prescription for prednisone low dose. Follow up appt on Friday.

## 2012-05-05 NOTE — Progress Notes (Signed)
  Subjective:    Patient ID: Anna [name], female    DOB: 1971-09-10, 41 y.o.   MRN: [id number]  HPI  41 year old female presents with cough, chest congestion. Has history of asthma. She has stated probiotic for yeast overgrowth  2 weeks ago. Felt tired with this. Had noted some SOB and chest tightness x 1 week [name] Used albuterol, Qvar. Helped some but then worsened. Seen at Urgent care (no X-ray).. Dx with sinus infection.. She felt this was wrong because she did not have any sinus symptoms. No headache. Started Z-pack on 7/3. No improvement when done A lot of nasal congestion, green productive cough. Has not used albuterol.  Has been having some  intermittent left lower quadrant pain similar to diverticulitis in past, fatigue.  no change when walking. Pain in left lower quadrant when pees and poops. She is due for endometriosis hormone shot this Friday.    Review of Systems  Constitutional: Positive for fatigue. Negative for fever.  HENT: Negative for ear pain.   Eyes: Negative for pain.  Respiratory: Positive for cough, chest tightness and shortness of breath.   Cardiovascular: Negative for palpitations and leg swelling.  Gastrointestinal: Negative for abdominal pain.  Genitourinary: Negative for dysuria.       Objective:   Physical Exam  Constitutional: Vital signs are normal. She appears well-developed and well-nourished. She is cooperative.  Non-toxic appearance. She does not appear ill. No distress.  HENT:  Head: Normocephalic.  Right Ear: Hearing, tympanic membrane, external ear and ear canal normal. Tympanic membrane is not erythematous, not retracted and not bulging.  Left Ear: Hearing, tympanic membrane, external ear and ear canal normal. Tympanic membrane is not erythematous, not retracted and not bulging.  Nose: Mucosal edema and rhinorrhea present. Right sinus exhibits no maxillary sinus tenderness and no frontal sinus tenderness. Left sinus exhibits no  maxillary sinus tenderness and no frontal sinus tenderness.  Mouth/Throat: Uvula is midline, oropharynx is clear and moist and mucous membranes are normal.  Eyes: Conjunctivae, EOM and lids are normal. Pupils are equal, round, and reactive to light. No foreign bodies found.  Neck: Trachea normal and normal range of motion. Neck supple. Carotid bruit is not present. No mass and no thyromegaly present.  Cardiovascular: Normal rate, regular rhythm, S1 normal, S2 normal, normal heart sounds, intact distal pulses and normal pulses.  Exam reveals no gallop and no friction rub.   No murmur heard. Pulmonary/Chest: Effort normal. Not tachypneic. No respiratory distress. She has decreased breath sounds. She has no wheezes. She has no rhonchi. She has no rales.  Abdominal: Normal appearance and bowel sounds are normal. There is tenderness in the left lower quadrant. There is no rebound, no guarding and no CVA tenderness. No hernia.  Neurological: She is alert.  Skin: Skin is warm, dry and intact. No rash noted.  Psychiatric: Her speech is normal and behavior is normal. Judgment normal. Her mood appears not anxious. Cognition and memory are normal. She does not exhibit a depressed mood.          Assessment & Plan:

## 2012-05-05 NOTE — Telephone Encounter (Signed)
Pt seen UC out of town last week; chest congestion and burning, productive cough with yellow green phlegm; diagnosed sinus infection. Finished Zpak yesterday. Pt not feeling better. Still cough with congestion, feels feverish,no wheezing or SOB. Pt request appt this afternoon due to pt having appt for son this AM. Appt scheduled Dr Para March today 3 pm.

## 2012-05-05 NOTE — Telephone Encounter (Signed)
Noted  

## 2012-05-07 ENCOUNTER — Telehealth: Payer: Self-pay | Admitting: Family Medicine

## 2012-05-07 NOTE — Telephone Encounter (Signed)
Caller: [name]/Patient; PCP: Excell Seltzer.; CB#: [phone];  Call regarding message for MD r/t recent visit for Cough/Congestion;  Asking if too early to return to MD for appt 05/08/12?  Diagnosed with bronchitis/asthma.  States she decided to take Prednisone with "huge improvment" to cough. Not using Albuterol MDI.   Afebrile.  MD plan was to see in office 05/08/12 before going out of town.  Declined triage.  Advised to keep appt at 0945 05/08/12 with Dr Ermalene Searing d/t MD action plan to follow up cough status before vacation.  Agreed to keep appt. Advice provided r/t other non urgent information for PCP and does not require PCP response per PCP Call-No Triage.

## 2012-05-08 ENCOUNTER — Other Ambulatory Visit: Payer: 59

## 2012-05-08 ENCOUNTER — Ambulatory Visit (INDEPENDENT_AMBULATORY_CARE_PROVIDER_SITE_OTHER): Payer: 59 | Admitting: Family Medicine

## 2012-05-08 ENCOUNTER — Encounter: Payer: Self-pay | Admitting: Family Medicine

## 2012-05-08 VITALS — BP 112/82 | HR 68 | Temp 97.6°F | Wt 149.0 lb

## 2012-05-08 DIAGNOSIS — N938 Other specified abnormal uterine and vaginal bleeding: Secondary | ICD-10-CM

## 2012-05-08 DIAGNOSIS — J45901 Unspecified asthma with (acute) exacerbation: Secondary | ICD-10-CM | POA: Insufficient documentation

## 2012-05-08 DIAGNOSIS — R1032 Left lower quadrant pain: Secondary | ICD-10-CM | POA: Insufficient documentation

## 2012-05-08 MED ORDER — LEUPROLIDE ACETATE (3 MONTH) 11.25 MG IM KIT
11.2500 mg | PACK | Freq: Once | INTRAMUSCULAR | Status: AC
  Administered 2012-05-08: 11.25 mg via INTRAMUSCULAR

## 2012-05-08 NOTE — Assessment & Plan Note (Signed)
Complete prednisone. Increase qVar.. If not helping may need to try symbicort etc. Allergy meds seem optimized.  No clear need for repeat antibiotics.

## 2012-05-08 NOTE — Assessment & Plan Note (Signed)
Call for further eval if not improving with endometriosis injection.

## 2012-05-08 NOTE — Assessment & Plan Note (Signed)
CXR shows no pneumonia. Decreased peak flows. Increase Qvar, pt hesitant about course of prednisone given SE in past but will start if not improving in next few days.  No clear sign of infection.  Close follow up Friday.

## 2012-05-08 NOTE — Telephone Encounter (Signed)
Anna [name]/SR PT °

## 2012-05-08 NOTE — Patient Instructions (Addendum)
Go ahead and complete prednisone. Increase Qvar to double dose. Go to urgent care if abdominal pian worsening or fever despite endometriosis shot. Follow up if not improving in next 1-2 week.

## 2012-05-08 NOTE — Progress Notes (Signed)
  Subjective:    Patient ID: Anna [name], female    DOB: 02/16/1971, 41 y.o.   MRN: [id number]  HPI  41 year old female presents here for follow up asthma exacerbation. S/P azithromycin from urgent care.  3 days ago peak flows diminished.. Told to increase QVAR, double (she did not do this). Started course of prednisone... Breathing began to feel better after starting prednsione. She had SE to the med... Palpitations, blood sugar dropping low, as she has had in the past. Continued decreased energy. Still productive cough, but no longer green. Not having as much body ache as previously.   Peak flows today remain low 210.   Left lower quadrant abdominal , continued, but no worse. No fever. No pain with movement. Has hormone injection for endometriosis today.  Going out of town this weekend on Sunday.    Review of Systems  Constitutional: Positive for fatigue. Negative for fever.  HENT: Negative for ear pain.   Eyes: Negative for pain.  Respiratory: Positive for chest tightness and shortness of breath. Negative for wheezing.   Cardiovascular: Negative for chest pain, palpitations and leg swelling.  Gastrointestinal: Negative for abdominal pain.  Genitourinary: Negative for dysuria.       Objective:   Physical Exam  Constitutional: Vital signs are normal. She appears well-developed and well-nourished. She is cooperative.  Non-toxic appearance. She does not appear ill. No distress.  HENT:  Head: Normocephalic.  Right Ear: Hearing, tympanic membrane, external ear and ear canal normal. Tympanic membrane is not erythematous, not retracted and not bulging.  Left Ear: Hearing, tympanic membrane, external ear and ear canal normal. Tympanic membrane is not erythematous, not retracted and not bulging.  Nose: Mucosal edema and rhinorrhea present. Right sinus exhibits no maxillary sinus tenderness and no frontal sinus tenderness. Left sinus exhibits no maxillary sinus tenderness and no frontal  sinus tenderness.  Mouth/Throat: Uvula is midline, oropharynx is clear and moist and mucous membranes are normal.  Eyes: Conjunctivae, EOM and lids are normal. Pupils are equal, round, and reactive to light. No foreign bodies found.  Neck: Trachea normal and normal range of motion. Neck supple. Carotid bruit is not present. No mass and no thyromegaly present.  Cardiovascular: Normal rate, regular rhythm, S1 normal, S2 normal, normal heart sounds, intact distal pulses and normal pulses.  Exam reveals no gallop and no friction rub.   No murmur heard. Pulmonary/Chest: Effort normal and breath sounds normal. Not tachypneic. No respiratory distress. She has no decreased breath sounds. She has no wheezes. She has no rhonchi. She has no rales.  Abdominal: Soft. There is no hepatosplenomegaly. There is tenderness in the left lower quadrant. There is no rigidity, no rebound, no guarding and no CVA tenderness.  Neurological: She is alert.  Skin: Skin is warm, dry and intact. No rash noted.  Psychiatric: Her speech is normal and behavior is normal. Judgment normal. Her mood appears not anxious. Cognition and memory are normal. She does not exhibit a depressed mood.          Assessment & Plan:

## 2012-05-08 NOTE — Assessment & Plan Note (Signed)
?   Due to endometriosis.. Less likely repeat of her past diverticulittis. HAs hormone injection Friday... If improved with this.. No further eval.  Call if fever, increase in pain.

## 2012-07-10 ENCOUNTER — Telehealth: Payer: Self-pay | Admitting: Family Medicine

## 2012-07-10 NOTE — Telephone Encounter (Signed)
Jennifer with [facility] calling because patient is been discharging from [facility].  States the doctor from the nursing home wrote orders for patient to have home health nursing, PT, OT & home health aid.  Please call her back.  Thanks °

## 2012-07-13 NOTE — Telephone Encounter (Signed)
Spoke to patient and patient stated that she does not know anything about this. Must be the wrong patient.

## 2012-07-16 ENCOUNTER — Telehealth: Payer: Self-pay | Admitting: Obstetrics and Gynecology

## 2012-07-16 ENCOUNTER — Other Ambulatory Visit: Payer: Self-pay

## 2012-07-16 DIAGNOSIS — N809 Endometriosis, unspecified: Secondary | ICD-10-CM

## 2012-07-16 MED ORDER — LEUPROLIDE ACETATE (3 MONTH) 11.25 MG IM KIT: 11.2500 mg | PACK | INTRAMUSCULAR | Status: DC

## 2012-07-16 NOTE — Telephone Encounter (Signed)
TC TO PT CONCERNING MESSAGE. PT STATES SHE WANT TO BE EVAL. FOR YEAST. PT C/O VAGINAL SWELLING AND A LITTLE DISCHARGE. SR GAVE PT BORAX SUPP. BUT SHE STATES IT IS NOT HELPING THIS TIME. PT TO COME IN TOMORROW AT9:30 TO SEE EP.

## 2012-07-16 NOTE — Telephone Encounter (Signed)
TRIAGE/INFECT. APPT REQ

## 2012-07-17 ENCOUNTER — Ambulatory Visit (INDEPENDENT_AMBULATORY_CARE_PROVIDER_SITE_OTHER): Payer: 59 | Admitting: Obstetrics and Gynecology

## 2012-07-17 ENCOUNTER — Encounter: Payer: Self-pay | Admitting: Obstetrics and Gynecology

## 2012-07-17 ENCOUNTER — Other Ambulatory Visit: Payer: Self-pay

## 2012-07-17 VITALS — BP 112/70 | Wt 148.0 lb

## 2012-07-17 DIAGNOSIS — B379 Candidiasis, unspecified: Secondary | ICD-10-CM

## 2012-07-17 DIAGNOSIS — B49 Unspecified mycosis: Secondary | ICD-10-CM

## 2012-07-17 LAB — POCT URINALYSIS DIPSTICK
Bilirubin, UA: NEGATIVE
Glucose, UA: NEGATIVE
Spec Grav, UA: <=1.005

## 2012-07-17 MED ORDER — TRIAMCINOLONE 0.1 % CREAM:EUCERIN CREAM 1:1: 1.0000 "application " | TOPICAL_CREAM | Freq: Three times a day (TID) | CUTANEOUS | Status: DC | PRN

## 2012-07-17 NOTE — Progress Notes (Signed)
   Symptoms have been present for 2 weeks. Has used over-the-counter treatment: no Associated symptoms:  Pelvic pain: yes       Dyspareunia: yes     Odor:  yes  History of STD:  no history of PID, STD's STD screen:declined Vaginal Discharge/Discomfort/Itching  Subjective:   Anna [name] is an 41 y.o. woman who presents c/o vulvar irritation on/off with discharge on/off  Objective: discharge white and thick and Vulvar eczema ++   Wet prep: normal epithelial cells OSOM BV: negative   Assessment: Eczema  Plan: Increase Triamcinolone to TID  Lanna Poche MD 07/17/2012 12:10 PM

## 2012-07-20 ENCOUNTER — Other Ambulatory Visit: Payer: Self-pay

## 2012-07-20 ENCOUNTER — Telehealth: Payer: Self-pay

## 2012-07-20 DIAGNOSIS — N809 Endometriosis, unspecified: Secondary | ICD-10-CM

## 2012-07-20 MED ORDER — TRIAMCINOLONE 0.1 % CREAM:EUCERIN CREAM 1:1: 1.0000 "application " | TOPICAL_CREAM | Freq: Three times a day (TID) | CUTANEOUS | Status: DC | PRN

## 2012-07-20 NOTE — Telephone Encounter (Signed)
LM for pt that Depo Lupron will [facility]ed to [facility].  ld

## 2012-07-22 ENCOUNTER — Telehealth: Payer: Self-pay

## 2012-07-22 NOTE — Telephone Encounter (Signed)
Pt notified that Depo Lupron is ordered and should be on the way.  Appt made for 07-31-12 at 9:00. Pt agreeable.  ld

## 2012-07-28 ENCOUNTER — Telehealth: Payer: Self-pay | Admitting: Obstetrics and Gynecology

## 2012-07-28 NOTE — Telephone Encounter (Signed)
TC from [facility] at [facility].  Depo Lupron to be shipped and delivered 07/30/12.

## 2012-07-30 ENCOUNTER — Telehealth: Payer: Self-pay | Admitting: Obstetrics and Gynecology

## 2012-07-30 NOTE — Telephone Encounter (Signed)
TC to pt. Informed Depo Lupron has arrived.

## 2012-07-31 ENCOUNTER — Other Ambulatory Visit (INDEPENDENT_AMBULATORY_CARE_PROVIDER_SITE_OTHER): Payer: 59

## 2012-07-31 DIAGNOSIS — N809 Endometriosis, unspecified: Secondary | ICD-10-CM

## 2012-07-31 MED ORDER — LEUPROLIDE ACETATE (3 MONTH) 11.25 MG IM KIT
11.2500 mg | PACK | Freq: Once | INTRAMUSCULAR | Status: AC
  Administered 2012-07-31: 11.25 mg via INTRAMUSCULAR

## 2012-08-04 LAB — HM PAP SMEAR

## 2012-08-05 ENCOUNTER — Telehealth: Payer: Self-pay | Admitting: Family Medicine

## 2012-08-05 NOTE — Telephone Encounter (Signed)
caller: [name]/Patient; Patient Name: Anna [name]; PCP: Kerby Nora [facility]); Best Callback Phone Number: [phone]; Reason for call: Urinary Pain/Bleeding.  Patient she has been  Depo/Lupron for endometriosis for 1 year. No menstrual cycles.  She is also has Diverticulitis- last episode was a year ago.  She complains of Left Lower Quadrant onset two weeks ago 07/20/12. Pain is intermittent, dull . Afebrile. treated with Ibuprofen with relief.  No pain with urination. +nausea. Eating and drinking normally.Saw her OB/GYN with negatives studies.  Emergent s/sx ruled out per Abdominal Pain Protocol with the exception to "Episodes of Abdominal Discomfort and occasional Nause or vomiting that are increasing in Frequency". See Provider in 72 hours.  Appointment scheduled tomorrow 08/06/12 at 10:45 with Dr. Ermalene Searing for evaluation. Home care instructions and guidelines reviewed with caller. Advised to closely mointor s/sx and call back for questions, changes or concerns.

## 2012-08-06 ENCOUNTER — Ambulatory Visit (INDEPENDENT_AMBULATORY_CARE_PROVIDER_SITE_OTHER): Payer: 59 | Admitting: Family Medicine

## 2012-08-06 ENCOUNTER — Encounter: Payer: Self-pay | Admitting: Family Medicine

## 2012-08-06 ENCOUNTER — Encounter: Payer: Self-pay | Admitting: *Deleted

## 2012-08-06 VITALS — BP 108/72 | HR 66 | Temp 97.7°F | Wt 149.2 lb

## 2012-08-06 DIAGNOSIS — R82998 Other abnormal findings in urine: Secondary | ICD-10-CM

## 2012-08-06 DIAGNOSIS — Z23 Encounter for immunization: Secondary | ICD-10-CM

## 2012-08-06 DIAGNOSIS — R110 Nausea: Secondary | ICD-10-CM

## 2012-08-06 DIAGNOSIS — R8290 Unspecified abnormal findings in urine: Secondary | ICD-10-CM | POA: Insufficient documentation

## 2012-08-06 DIAGNOSIS — R1032 Left lower quadrant pain: Secondary | ICD-10-CM

## 2012-08-06 LAB — CBC WITH DIFFERENTIAL/PLATELET
Basophils Absolute: 0 10*3/uL (ref 0.0–0.1)
Eosinophils Absolute: 0.3 10*3/uL (ref 0.0–0.7)
Eosinophils Relative: 3.6 % (ref 0.0–5.0)
HCT: 43.5 % (ref 36.0–46.0)
Lymphs Abs: 2.8 10*3/uL (ref 0.7–4.0)
MCV: 94.5 fl (ref 78.0–100.0)
Monocytes Absolute: 0.6 10*3/uL (ref 0.1–1.0)
Neutrophils Relative %: 48.2 % (ref 43.0–77.0)
Platelets: 224 10*3/uL (ref 150.0–400.0)
RDW: 13.4 % (ref 11.5–14.6)
WBC: 7.2 10*3/uL (ref 4.5–10.5)

## 2012-08-06 LAB — COMPREHENSIVE METABOLIC PANEL
ALT: 19 U/L (ref 0–35)
Albumin: 4.2 g/dL (ref 3.5–5.2)
Alkaline Phosphatase: 65 U/L (ref 39–117)
CO2: 28 mEq/L (ref 19–32)
Glucose, Bld: 84 mg/dL (ref 70–99)
Potassium: 4.1 mEq/L (ref 3.5–5.1)
Sodium: 135 mEq/L (ref 135–145)
Total Bilirubin: 0.4 mg/dL (ref 0.3–1.2)
Total Protein: 7.6 g/dL (ref 6.0–8.3)

## 2012-08-06 LAB — POCT URINALYSIS DIPSTICK
Glucose, UA: NEGATIVE
Ketones, UA: NEGATIVE
Leukocytes, UA: NEGATIVE
Spec Grav, UA: 1.01
Urobilinogen, UA: 0.2

## 2012-08-06 LAB — LIPASE: Lipase: 36 U/L (ref 11.0–59.0)

## 2012-08-06 NOTE — Assessment & Plan Note (Signed)
Likely due to endometriosis, improving with lupron but not resolved completely.  Doubt diverticultitis, not clearly gallbladder etc.

## 2012-08-06 NOTE — Progress Notes (Signed)
  Subjective:    Patient ID: Anna [name], female    DOB: 10-22-1971, 41 y.o.   MRN: [id number]  HPI  41 year old female with history of chronic abdominal pain from endometrioisis presents with left lower quadrant pain.  She noted similar pain in 04/2012, did get better with hormone shot (lupron) for endometriosis.  She has had recurrence 3 weeks ago, 2 weeks before due for hormone shot. This time with nausea.  Saw GYN, no yeast or vaginal infection. 10/4 received shot and pain has improved some but still has nausea, ? Mild low grade temp (subjective) Occ headaches for 4-5 days when nausea started. .  Nausea better slightly with eating but comes right back. No vomiting. No diarrhea, no constipation.  More nausea when bladder fills up. No dysuria, no frequency or urgency.  Urine does have a smell.  No reflux, no heartburn.   She still has gallbladder.  Review of Systems  Constitutional: Negative for fever and fatigue.  HENT: Negative for ear pain.   Eyes: Negative for pain.  Respiratory: Negative for chest tightness and shortness of breath.   Cardiovascular: Negative for chest pain, palpitations and leg swelling.  Gastrointestinal: Negative for abdominal pain.  Genitourinary: Negative for dysuria.       Objective:   Physical Exam  Constitutional: She is oriented to person, place, and time. She appears well-developed and well-nourished.  HENT:  Head: Normocephalic and atraumatic.  Nose: Nose normal.  Mouth/Throat: Oropharynx is clear and moist.  Eyes: Conjunctivae normal and EOM are normal. Pupils are equal, round, and reactive to light.  Neck: Normal range of motion. Neck supple. No thyromegaly present.  Cardiovascular: Normal rate and regular rhythm.   No murmur heard. Pulmonary/Chest: Effort normal and breath sounds normal. No respiratory distress. She has no wheezes. She has no rales. She exhibits no tenderness.  Abdominal: She exhibits no mass. There is no splenomegaly or  hepatomegaly. There is tenderness in the periumbilical area. There is no rebound, no guarding and no CVA tenderness. No hernia. Hernia confirmed negative in the ventral area, confirmed negative in the right inguinal area and confirmed negative in the left inguinal area.    Neurological: She is alert and oriented to person, place, and time.  Skin: Skin is warm and dry.          Assessment & Plan:

## 2012-08-06 NOTE — Assessment & Plan Note (Addendum)
UA clear. Urine pregnancy: Neg Pain is improving with lupron.  Nausea may be from endometriosis but pt has never had before.. Will eval with labs. Not very consistent with ulcer or gallbladder but can consider [place] or Hpylori if not improving.

## 2012-08-06 NOTE — Patient Instructions (Addendum)
We will cal you with lab results.  Push fluids, call if symptoms changing.

## 2012-08-14 ENCOUNTER — Telehealth: Payer: Self-pay

## 2012-08-14 NOTE — Telephone Encounter (Signed)
Pt wanted date of tetanus shot. Not on immunization list. Pt said she thought she got recently. Advised pt she got pneumovax 03/27/12 and under pt instructions for that visit pt was to look into Tdap and to call back if interested in getting.Pt voiced understanding.

## 2012-08-14 NOTE — Telephone Encounter (Signed)
Pt left v/m requesting order for tdap nurse visit.Please advise.

## 2012-08-14 NOTE — Telephone Encounter (Signed)
Pleae set up for Tdap as requested.

## 2012-08-17 NOTE — Telephone Encounter (Signed)
Called patient to schedule vaccine. Was advised by patient that she is still having stomach pain and she requested a follow-up appointment and will have vaccine at that time.

## 2012-08-18 ENCOUNTER — Encounter: Payer: Self-pay | Admitting: Family Medicine

## 2012-08-18 ENCOUNTER — Ambulatory Visit (INDEPENDENT_AMBULATORY_CARE_PROVIDER_SITE_OTHER): Payer: 59 | Admitting: Family Medicine

## 2012-08-18 ENCOUNTER — Ambulatory Visit (INDEPENDENT_AMBULATORY_CARE_PROVIDER_SITE_OTHER)
Admission: RE | Admit: 2012-08-18 | Discharge: 2012-08-18 | Disposition: A | Discharge status: Home / Self Care | Payer: 59 | Source: Ambulatory Visit | Attending: Family Medicine | Admitting: Family Medicine

## 2012-08-18 VITALS — BP 100/70 | HR 70 | Temp 98.4°F | Resp 20 | Ht 68.0 in | Wt 151.2 lb

## 2012-08-18 DIAGNOSIS — K5732 Diverticulitis of large intestine without perforation or abscess without bleeding: Secondary | ICD-10-CM

## 2012-08-18 DIAGNOSIS — R1032 Left lower quadrant pain: Secondary | ICD-10-CM

## 2012-08-18 DIAGNOSIS — Z23 Encounter for immunization: Secondary | ICD-10-CM

## 2012-08-18 DIAGNOSIS — N801 Endometriosis of ovary: Secondary | ICD-10-CM

## 2012-08-18 MED ORDER — MOMETASONE FUROATE 50 MCG/ACT NA SUSP: 2.0000 | Freq: Every day | NASAL | Status: DC

## 2012-08-18 MED ORDER — IOHEXOL 300 MG/ML  SOLN
100.0000 mL | Freq: Once | INTRAMUSCULAR | Status: AC | PRN
  Administered 2012-08-18: 100 mL via INTRAVENOUS

## 2012-08-18 NOTE — Patient Instructions (Addendum)
Stop at front desk to set up CT. We will call with results.

## 2012-08-18 NOTE — Addendum Note (Signed)
Addended by: Patience Musca on: 08/18/2012 09:01 AM   Modules accepted: Orders

## 2012-08-18 NOTE — Progress Notes (Signed)
  Subjective:    Patient ID: Anna [name], female    DOB: 11-Apr-1971, 41 y.o.   MRN: [id number]  HPI  41 year old female with history of chronic abdominal pain from endometrioisis presents with  CONTINUED left lower quadrant pain.  She is here 12 days after her last OV. She noted similar pain in 04/2012, did get better with hormone shot (lupron) for endometriosis.  She has had recurrence 5weeks ago, 2 weeks before due for hormone shot. This time with nausea.  Saw GYN, no yeast or vaginal infection.  10/4 received shot and pain has improved some but still has nausea, ? Mild low grade temp (subjective) Occ headaches for 4-5 days when nausea started. .  Nausea better slightly with eating but comes right back. No vomiting. No diarrhea, no constipation.  More nausea when bladder fills up. No dysuria, no frequency or urgency.  Urine does have a smell.  No reflux, no heartburn.  She still has gallbladder.   At last OV: Upreg negative, cbc, cmet, lipase nml.  She reports that her nausea has gotten better, but abdominal pain has not going away. Pain 4-5/10, at times 7-8/10 Pain worse wearing tighter pants. She reports pain is now more in left lower quadrant. She feels it is associated with her colon as she feels much better after she has BM. The pain returns immediately. She feels when bladder full she has more pain. She also has more pain when stool moves through colon (no rectal pain) No blood in stool , no diarrhea, no constipation. No fever.  She is going out of town next week.  She drinks lots of water, fiber and probiotic.. No help. Review of Systems  Constitutional: Negative for fever and fatigue.  HENT: Negative for ear pain.   Eyes: Negative for pain.  Respiratory: Negative for chest tightness and shortness of breath.   Cardiovascular: Negative for chest pain, palpitations and leg swelling.  Gastrointestinal: Positive for abdominal pain.  Genitourinary: Negative for dysuria.         Objective:   Physical Exam  Constitutional: She is oriented to person, place, and time. She appears well-developed and well-nourished.  HENT:  Head: Normocephalic and atraumatic.  Nose: Nose normal.  Mouth/Throat: Oropharynx is clear and moist.  Eyes: Conjunctivae normal and EOM are normal. Pupils are equal, round, and reactive to light.  Neck: Normal range of motion. Neck supple. No thyromegaly present.  Cardiovascular: Normal rate and regular rhythm.   No murmur heard. Pulmonary/Chest: Effort normal and breath sounds normal. No respiratory distress. She has no wheezes. She has no rales. She exhibits no tenderness.  Abdominal: She exhibits no mass. There is no splenomegaly or hepatomegaly. There is tenderness in the periumbilical area and left lower quadrant. There is no rebound, no guarding and no CVA tenderness. No hernia. Hernia confirmed negative in the ventral area, confirmed negative in the right inguinal area and confirmed negative in the left inguinal area.    Neurological: She is alert and oriented to person, place, and time.  Skin: Skin is warm and dry.          Assessment & Plan:

## 2012-08-18 NOTE — Addendum Note (Signed)
Addended by: Kerby Nora E on: 08/18/2012 05:06 PM   Modules accepted: Orders

## 2012-08-18 NOTE — Assessment & Plan Note (Signed)
Will eval further with imaging.

## 2012-08-19 ENCOUNTER — Telehealth: Payer: Self-pay | Admitting: Obstetrics and Gynecology

## 2012-08-19 NOTE — Telephone Encounter (Signed)
Spoke with pt rgd msg pt states had depo lupron shot 3 weeks ago still experiencing pain pt states went to PCP they did ct scan (results in chart under imaging) to see if any diverticulitis pt states every thing wnl PCP states need to f/u with OBGYN may be endometriosis pt has question rgd if depo Lupron no longer works for her because she has been on it for a long time and if there is a higher dosage because she is still in pain advised pt will consult with Sr and call her back pt voice understanding

## 2012-08-19 NOTE — Telephone Encounter (Signed)
Depo-Lupron does not stop working overtime. Depo-Lupron is not 100% success. Please offer pt appointment to review options

## 2012-08-20 NOTE — Telephone Encounter (Signed)
Please see SR msg

## 2012-08-21 NOTE — Telephone Encounter (Signed)
Lm on vm tcb rgd msg 

## 2012-08-21 NOTE — Telephone Encounter (Signed)
Spoke with pt rgd previous msg informed need appt with SR to discuss plan of care pt on the way out of town will call back tp schd appt

## 2012-08-21 NOTE — Telephone Encounter (Signed)
Lm on vm tcb rgd previous call 

## 2012-08-25 ENCOUNTER — Telehealth: Payer: Self-pay

## 2012-08-25 NOTE — Telephone Encounter (Signed)
Sch AEX appt.  Pt would also like to discuss a possible change in Lupron.  Increase in pain.  ld

## 2012-09-09 ENCOUNTER — Encounter: Payer: Self-pay | Admitting: Obstetrics and Gynecology

## 2012-09-09 ENCOUNTER — Encounter: Payer: 59 | Admitting: Obstetrics and Gynecology

## 2012-09-09 ENCOUNTER — Ambulatory Visit (INDEPENDENT_AMBULATORY_CARE_PROVIDER_SITE_OTHER): Payer: 59 | Admitting: Obstetrics and Gynecology

## 2012-09-09 VITALS — BP 110/68 | Ht 67.0 in | Wt 151.0 lb

## 2012-09-09 DIAGNOSIS — Z01419 Encounter for gynecological examination (general) (routine) without abnormal findings: Secondary | ICD-10-CM

## 2012-09-09 DIAGNOSIS — N809 Endometriosis, unspecified: Secondary | ICD-10-CM

## 2012-09-09 DIAGNOSIS — Z124 Encounter for screening for malignant neoplasm of cervix: Secondary | ICD-10-CM

## 2012-09-09 MED ORDER — LEUPROLIDE ACETATE (3 MONTH) 11.25 MG IM KIT: 11.2500 mg | PACK | INTRAMUSCULAR | Status: AC

## 2012-09-09 NOTE — Progress Notes (Signed)
The patient reports: No complaints Contraception: Lupron and husband vasectomy Last Pap:08/29/2011 Normal Last Mammogram: 2006 Blocked milk duct  GC/Chlamydia cultures offered: declined HIV/RPR/HbsAg offered:  declined HSV 1 and 2 glycoprotein offered: declined  Menstrual cycle regular and monthly: No:  Lupron Menstrual flow normal: No:   Urinary symptoms: none Normal bowel movements: Yes Reports abuse at home: No:   Subjective:    Anna [name] is a 41 y.o. female, G2P2, who presents for an annual exam.     History   Social History  . Marital Status: Married    Spouse Name: N/A    Number of Children: N/A  . Years of Education: N/A   Occupational History  . stay at home mom; part time admin. asst    Social History Main Topics  . Smoking status: Former Games developer  . Smokeless tobacco: Never Used     Comment: quit over 16 years ago  . Alcohol Use: 0.5 - 1.0 oz/week    1-2 drink(s) per week     Comment: wine; beer  . Drug Use: No  . Sexually Active: Yes    Birth Control/ Protection: Injection     Comment: Husband had vasectomy   Other Topics Concern  . None   Social History Narrative   Stay at home mom; part time administrative asstMarriedChildrenRegular exercise-yes 3 days/week cardio 45 min3 meals; fruit/veggies, no soda, lots of water    Menstrual cycle:   LMP: Patient's last menstrual period was 07/11/2011.           Cycle: no cycle with depo lupron  The following portions of the patient's history were reviewed and updated as appropriate: allergies, current medications, past family history, past medical history, past social history, past surgical history and problem list.  Review of Systems Pertinent items are noted in HPI. Breast:Negative for breast lump,nipple discharge or nipple retraction Gastrointestinal: Negative for abdominal pain, change in bowel habits or rectal bleeding Urinary:negative   Objective:    BP 110/68  Ht 5\' 7"  (1.702 m)  Wt 151 lb  (68.493 kg)  BMI 23.65 kg/m2  LMP 07/11/2011    Weight:  Wt Readings from Last 1 Encounters:  09/09/12 151 lb (68.493 kg)          BMI: Body mass index is 23.65 kg/(m^2).  General Appearance: Alert, appropriate appearance for age. No acute distress HEENT: Grossly normal Neck / Thyroid: Supple, no masses, nodes or enlargement Lungs: clear to auscultation bilaterally Back: No CVA tenderness Breast Exam: Normal to inspection and No masses or nodes.No dimpling, nipple retraction or discharge. Cardiovascular: Regular rate and rhythm. S1, S2, no murmur Gastrointestinal: Soft, non-tender, no masses or organomegaly Pelvic Exam: Vulva and vagina appear normal. Bimanual exam reveals normal uterus and adnexa. Rectovaginal: not indicated Lymphatic Exam: Non-palpable nodes in neck, clavicular, axillary, or inguinal regions Skin: no rash or abnormalities Neurologic: Normal gait and speech, no tremor  Psychiatric: Alert and oriented, appropriate affect.     Assessment:    Normal gyn exam Endometriosis well controlled with only 1 episode of pain in the last year    Plan:    mammogram pap smear return annually or prn STD screening: declined Contraception:vasectomy Endometriosis discussed Depo-Lupron discussed: will continue   Silverio Lay MD

## 2012-09-10 ENCOUNTER — Telehealth: Payer: Self-pay

## 2012-09-10 LAB — PAP IG W/ RFLX HPV ASCU

## 2012-09-10 NOTE — Telephone Encounter (Signed)
Returning your call. °

## 2012-10-19 ENCOUNTER — Telehealth: Payer: Self-pay | Admitting: Obstetrics and Gynecology

## 2012-10-19 ENCOUNTER — Other Ambulatory Visit: Payer: 59

## 2012-10-19 DIAGNOSIS — N809 Endometriosis, unspecified: Secondary | ICD-10-CM

## 2012-10-19 MED ORDER — LEUPROLIDE ACETATE (3 MONTH) 11.25 MG IM KIT
11.2500 mg | PACK | Freq: Once | INTRAMUSCULAR | Status: AC
  Administered 2012-10-19: 11.25 mg via INTRAMUSCULAR

## 2012-10-23 ENCOUNTER — Other Ambulatory Visit: Payer: 59

## 2012-12-03 ENCOUNTER — Encounter: Payer: Self-pay | Admitting: Family Medicine

## 2012-12-03 ENCOUNTER — Telehealth: Payer: Self-pay | Admitting: Family Medicine

## 2012-12-03 ENCOUNTER — Ambulatory Visit (INDEPENDENT_AMBULATORY_CARE_PROVIDER_SITE_OTHER): Payer: 59 | Admitting: Family Medicine

## 2012-12-03 VITALS — BP 116/68 | HR 75 | Temp 97.5°F | Wt 157.0 lb

## 2012-12-03 DIAGNOSIS — R300 Dysuria: Secondary | ICD-10-CM

## 2012-12-03 DIAGNOSIS — N390 Urinary tract infection, site not specified: Secondary | ICD-10-CM

## 2012-12-03 LAB — POCT URINALYSIS DIPSTICK
Protein, UA: NEGATIVE
Spec Grav, UA: <=1.005

## 2012-12-03 MED ORDER — FLUCONAZOLE 150 MG PO TABS: 150.0000 mg | ORAL_TABLET | Freq: Once | ORAL | Status: DC

## 2012-12-03 MED ORDER — CIPROFLOXACIN HCL 250 MG PO TABS: 250.0000 mg | ORAL_TABLET | Freq: Two times a day (BID) | ORAL | Status: DC

## 2012-12-03 NOTE — Telephone Encounter (Signed)
Will see today.  

## 2012-12-03 NOTE — Progress Notes (Signed)
Seen at [facility] for dysuria.  She had AZO at point.  Was started on macrobid for presumed UTI.  Started on macrobid but still with some frequency, heaviness, burning with urination, but improved from prev.  She has had some concurrent URI sx of same duration. No fevers.  Some cough, rhinorrhea, congestion. No ear pain.  Ears feel stuffy.  She's on day #5 of macrobid.    Meds, vitals, and allergies reviewed.   ROS: See HPI.  Otherwise, noncontributory.  GEN: nad, alert and oriented HEENT: mucous membranes moist, TM wnl, nasal exam stuffy.  OP with mild cobblestoning NECK: supple CV: rrr.  PULM: ctab, no inc wob ABD: soft, +bs, suprapubic area not tender EXT: no edema SKIN: no acute rash BACK: no CVA pain

## 2012-12-03 NOTE — Telephone Encounter (Signed)
Patient Information:  Caller Name: Lenita  Phone: [phone]  Patient: Anna [name]  Gender: Female  DOB: 03/12/1971  Age: 42 Years  PCP: Kerby Nora (Family Practice)  Pregnant: No  Office Follow Up:  Does the office need to follow up with this patient?: No  Instructions For The Office: N/A   Symptoms  Reason For Call & Symptoms: Patient seen at [facility] on Sunday for UTI.  She was prescribed Macrobid 100mg  bid.  She is on day #5.  She continues with symptoms.  Frequency and hesistancy, decreased burning but pressure. No culture done at [facility] only  Urine dip.    Reviewed Health History In EMR: Yes  Reviewed Medications In EMR: Yes  Reviewed Allergies In EMR: Yes  Reviewed Surgeries / Procedures: No  Date of Onset of Symptoms: 11/29/2012  Treatments Tried: Macrobid, pyridium only taken Monday tuesday  Treatments Tried Worked: Yes OB / GYN:  LMP: Unknown  Guideline(s) Used:  Urination Pain - Female  Disposition Per Guideline:   See Today in Office  Reason For Disposition Reached:   Taking antibiotic > 3 days for UTI and painful urination not improved  Advice Given:  Fluids:   Drink extra fluids. Drink 8-10 glasses of liquids a day (Reason: to produce a dilute, non-irritating urine).  Cranberry Juice:   Some people think that drinking cranberry juice may help in fighting urinary tract infections. However, there is no good research that has ever proved this.  Warm Saline SITZ Baths to Reduce Pain:  Sit in a warm saline bath for 20 minutes to cleanse the area and to reduce pain. Add 2 oz. of table salt or baking soda to a tub of water.  Call Back If:  You become worse.  Call Back If:   Fever lasts more than 24 hours on antibiotics  Pain does not improve by day 3 on antibiotics  Urine symptoms do not improve by day 3 on antibiotics  You become worse.  Appointment Scheduled:  12/03/2012 14:00:00 Appointment Scheduled Provider:  Crawford Givens Clelia Croft) [facility])

## 2012-12-03 NOTE — Patient Instructions (Addendum)
Drink plenty of water and finish the macrobid.  If not resolved, then start the cipro.  We'll contact you with your lab report.  Take care.

## 2012-12-04 DIAGNOSIS — N390 Urinary tract infection, site not specified: Secondary | ICD-10-CM | POA: Insufficient documentation

## 2012-12-04 NOTE — Assessment & Plan Note (Signed)
Finish the macrobid, she may resolve the sx with that.  Check ucx in meantime.  Start cipro if sx continue after finishing the macrobid.  She agrees.  The mild URI sx should resolve.

## 2012-12-05 LAB — URINE CULTURE: Organism ID, Bacteria: NO GROWTH

## 2012-12-30 ENCOUNTER — Telehealth: Payer: Self-pay

## 2012-12-30 NOTE — Telephone Encounter (Signed)
Pt calling to get delivery for Depo-Lupon. Contacted [facility] states that they have rx on file. Will contact pt to discuss 2014 benefits. Then will fax delivery form to office.  Pt advised   Darien Ramus, CMA

## 2013-01-04 ENCOUNTER — Telehealth: Payer: Self-pay

## 2013-01-04 NOTE — Telephone Encounter (Signed)
TC from pt states that she spoke with [facility] regarding Depo-Lupron. Will fax delivery form to office.   Called pharmacy will be shipped on 01-07-13, will receive on 01-08-13  Pt aware.   Darien Ramus, CMA

## 2013-01-11 ENCOUNTER — Other Ambulatory Visit: Payer: 59

## 2013-01-11 MED ORDER — LEUPROLIDE ACETATE (3 MONTH) 11.25 MG IM KIT
11.2500 mg | PACK | Freq: Once | INTRAMUSCULAR | Status: AC
  Administered 2013-01-11: 11.25 mg via INTRAMUSCULAR

## 2013-01-11 NOTE — Progress Notes (Signed)
Pt presented for Depo Lupron . Pt denies any problems and desires next dose. Pt states is taking Calcium and using contraception. Given without difficulty.

## 2013-01-15 ENCOUNTER — Telehealth: Payer: Self-pay

## 2013-01-15 NOTE — Telephone Encounter (Signed)
Marchelle Folks pharmacist with [facility] left v/m requesting Q var inhalers that were sent to our office instead of pts home and were damaged when received be sent back to [facility]. Marchelle Folks request call back to [phone] using ref # [id number].

## 2013-01-19 ENCOUNTER — Other Ambulatory Visit: Payer: Self-pay | Admitting: Sports Medicine

## 2013-01-19 DIAGNOSIS — M542 Cervicalgia: Secondary | ICD-10-CM

## 2013-01-19 NOTE — Telephone Encounter (Signed)
What do I need to do?

## 2013-01-19 NOTE — Telephone Encounter (Signed)
Will ask Anna [name] and Anna [name]

## 2013-01-24 ENCOUNTER — Other Ambulatory Visit: Payer: 59

## 2013-01-27 ENCOUNTER — Ambulatory Visit
Admission: RE | Admit: 2013-01-27 | Discharge: 2013-01-27 | Disposition: A | Discharge status: Home / Self Care | Payer: 59 | Source: Ambulatory Visit | Attending: Sports Medicine | Admitting: Sports Medicine

## 2013-01-27 DIAGNOSIS — M542 Cervicalgia: Secondary | ICD-10-CM

## 2013-02-02 ENCOUNTER — Ambulatory Visit (INDEPENDENT_AMBULATORY_CARE_PROVIDER_SITE_OTHER): Payer: 59 | Admitting: Family Medicine

## 2013-02-02 ENCOUNTER — Telehealth: Payer: Self-pay | Admitting: Family Medicine

## 2013-02-02 ENCOUNTER — Encounter: Payer: Self-pay | Admitting: Family Medicine

## 2013-02-02 VITALS — BP 110/60 | HR 79 | Temp 99.0°F | Ht 67.0 in | Wt 155.5 lb

## 2013-02-02 DIAGNOSIS — B9789 Other viral agents as the cause of diseases classified elsewhere: Secondary | ICD-10-CM

## 2013-02-02 DIAGNOSIS — B349 Viral infection, unspecified: Secondary | ICD-10-CM

## 2013-02-02 MED ORDER — ALBUTEROL SULFATE (2.5 MG/3ML) 0.083% IN NEBU: 2.5000 mg | INHALATION_SOLUTION | Freq: Four times a day (QID) | RESPIRATORY_TRACT | Status: DC | PRN

## 2013-02-02 NOTE — Telephone Encounter (Signed)
HAve her come in at 3:30

## 2013-02-02 NOTE — Progress Notes (Signed)
  Subjective:    Patient ID: Anna [name], female    DOB: Jun 08, 1971, 42 y.o.   MRN: [id number]  Cough The problem has been rapidly worsening (sudden onset). The problem occurs constantly. The cough is productive of sputum. Associated symptoms include ear congestion, myalgias, nasal congestion, postnasal drip and rhinorrhea. Pertinent negatives include no chills. Associated symptoms comments: Low grade temperature.. Taking tylenol  bodyaches. Risk factors: no sick contacts. The treatment provided mild (tyelnol cold med) relief. Her past medical history is significant for environmental allergies. There is no history of asthma, bronchiectasis, bronchitis, COPD, emphysema or pneumonia.      Review of Systems  Constitutional: Negative for chills.  HENT: Positive for rhinorrhea and postnasal drip.   Musculoskeletal: Positive for myalgias.  Allergic/Immunologic: Positive for environmental allergies.       Objective:   Physical Exam  Constitutional: Vital signs are normal. She appears well-developed and well-nourished. She is cooperative.  Non-toxic appearance. She does not appear ill. No distress.  HENT:  Head: Normocephalic.  Right Ear: Hearing, tympanic membrane, external ear and ear canal normal. Tympanic membrane is not erythematous, not retracted and not bulging.  Left Ear: Hearing, tympanic membrane, external ear and ear canal normal. Tympanic membrane is not erythematous, not retracted and not bulging.  Nose: Mucosal edema and rhinorrhea present. Right sinus exhibits no maxillary sinus tenderness and no frontal sinus tenderness. Left sinus exhibits no maxillary sinus tenderness and no frontal sinus tenderness.  Mouth/Throat: Uvula is midline, oropharynx is clear and moist and mucous membranes are normal.  Eyes: Conjunctivae, EOM and lids are normal. Pupils are equal, round, and reactive to light. No foreign bodies found.  Neck: Trachea normal and normal range of motion. Neck supple.  Carotid bruit is not present. No mass and no thyromegaly present.  Cardiovascular: Normal rate, regular rhythm, S1 normal, S2 normal, normal heart sounds, intact distal pulses and normal pulses.  Exam reveals no gallop and no friction rub.   No murmur heard. Pulmonary/Chest: Effort normal and breath sounds normal. Not tachypneic. No respiratory distress. She has no decreased breath sounds. She has no wheezes. She has no rhonchi. She has no rales.  Neurological: She is alert.  Skin: Skin is warm, dry and intact. No rash noted.  Psychiatric: Her speech is normal and behavior is normal. Judgment normal. Her mood appears not anxious. Cognition and memory are normal. She does not exhibit a depressed mood.          Assessment & Plan:

## 2013-02-02 NOTE — Patient Instructions (Addendum)
Increase Qvar to 2 puff inh twice daily. VContinue allergy meds. Have albuterol on hand to use for wheeze. Otherwise symptomatic care.. Call if not improving as expected.

## 2013-02-02 NOTE — Telephone Encounter (Signed)
Patient advised and will be here

## 2013-02-02 NOTE — Assessment & Plan Note (Signed)
Flu test negative. Symptomatic care.  No current sign of asthma flare. She can increase QVar to 2 puffs daily to avoid flare .

## 2013-02-02 NOTE — Telephone Encounter (Signed)
Patient Information:  Caller Name: [name]  Phone: [phone]  Patient: Anna [name]  Gender: Female  DOB: 1971/03/10  Age: 42 Years  PCP: Kerby Nora [facility])  Pregnant: No  Office Follow Up:  Does the office need to follow up with this patient?: Yes  Instructions For The Office: See RN Notes  RN Note:  LMP: Patient is on Lupron shot for menopause. No appointments available today. Please contact patient to let her know if she can be worked into today's schedule or if she needs to go to an UC. She would be available to come in after 2:45pm due to having to pick up her children from school.   Symptoms  Reason For Call & Symptoms: Low grade fever, body aches, nasal congestion/runny nose, productive cough with yellow mucus. Mild sore throat from coughing. Reports pain in face that gets worse when bending over.  Reviewed Health History In EMR: Yes  Reviewed Medications In EMR: Yes  Reviewed Allergies In EMR: Yes  Reviewed Surgeries / Procedures: Yes  Date of Onset of Symptoms: 02/01/2013  Treatments Tried: Mucinex, Tylenol  Treatments Tried Worked: No  Any Fever: Yes  Fever Taken: Oral  Fever Time Of Reading: 12:15:17  Fever Last Reading: 99.5 OB / GYN:  LMP: Unknown  Guideline(s) Used:  Colds  Disposition Per Guideline:   See Today in Office  Reason For Disposition Reached:   Sinus pain (not just congestion) and fever  Advice Given:  N/A  Patient Will Follow Care Advice:  YES

## 2013-02-03 ENCOUNTER — Telehealth: Payer: Self-pay

## 2013-02-03 MED ORDER — ALBUTEROL SULFATE HFA 108 (90 BASE) MCG/ACT IN AERS: 2.0000 | INHALATION_SPRAY | Freq: Four times a day (QID) | RESPIRATORY_TRACT | Status: DC | PRN

## 2013-02-03 NOTE — Telephone Encounter (Signed)
[facility] received e script for albuterol nebulizer solution and pt needs albuterol inhaler.Please advise.

## 2013-02-03 NOTE — Telephone Encounter (Signed)
rx sent to pharmacy

## 2013-02-10 ENCOUNTER — Telehealth: Payer: Self-pay | Admitting: Family Medicine

## 2013-02-10 NOTE — Telephone Encounter (Signed)
Patient Information:  Caller Name: Neta  Phone: [phone]  Patient: Anna [name]  Gender: Female  DOB: 01/21/1971  Age: 42 Years  PCP: Kerby Nora (Family Practice)  Pregnant: No  Office Follow Up:  Does the office need to follow up with this patient?: Yes  Instructions For The Office: PLEASE F/U WITH PT CONCERNING RX CALLED IN TO PHARMACY OR IF PROVIDER WANTS TO SEE HER AGAIN.  RN Note:  Pt request Rx since she was just in the office 1 week ago.  Symptoms  Reason For Call & Symptoms: Pt states she was seen in the office last week 02/02/13 and was instructed to call back if not improved in a few days. Pt states the chest congestion is worse.  Reviewed Health History In EMR: Yes  Reviewed Medications In EMR: Yes  Reviewed Allergies In EMR: Yes  Reviewed Surgeries / Procedures: Yes  Date of Onset of Symptoms: 02/02/2013 OB / GYN:  LMP: Unknown  Guideline(s) Used:  Cough  Disposition Per Guideline:   See Within 3 Days in Office  Reason For Disposition Reached:   Cough has been present for > 10 days  Advice Given:  Reassurance  Coughing is the way that our lungs remove irritants and mucus. It helps protect our lungs from getting pneumonia.  You can get a dry hacking cough after a chest cold. Sometimes this type of cough can last 1-3 weeks, and be worse at night.  You can also get a cough after being exposed to irritating substances like smoke, strong perfumes, and dust.  Here is some care advice that should help.  Cough Medicines:  Home Remedy - Honey: This old home remedy has been shown to help decrease coughing at night. The adult dosage is 2 teaspoons (10 ml) at bedtime. Honey should not be given to infants under one year of age.  Coughing Spasms:  Drink warm fluids. Inhale warm mist (Reason: both relax the airway and loosen up the phlegm).  Prevent Dehydration:  Drink adequate liquids.  This will help soothe an irritated or dry throat and loosen up the phlegm.  Call  Back If:  Difficulty breathing  Cough lasts more than 3 weeks  You become worse.  Patient Will Follow Care Advice:  YES

## 2013-02-11 MED ORDER — AZITHROMYCIN 250 MG PO TABS: ORAL_TABLET | ORAL | Status: DC

## 2013-02-11 NOTE — Telephone Encounter (Signed)
zpak 2 tabs po on day 1, then 1 tab po for 4 days  

## 2013-02-11 NOTE — Telephone Encounter (Signed)
RX SENT TO PHARMACY AND PATIENT ADVISED

## 2013-02-11 NOTE — Telephone Encounter (Signed)
Dr copland can you look at this b/c patient is not feeling any better and was told to call back for antibiotic

## 2013-02-12 ENCOUNTER — Telehealth: Payer: Self-pay | Admitting: Family Medicine

## 2013-02-12 ENCOUNTER — Other Ambulatory Visit: Payer: Self-pay | Admitting: *Deleted

## 2013-02-12 MED ORDER — DROSPIRENONE-ESTRADIOL 0.5-1 MG PO TABS: 1.0000 | ORAL_TABLET | Freq: Every day | ORAL | Status: DC

## 2013-02-12 NOTE — Telephone Encounter (Signed)
Patient Information:  Caller Name: [place]  Phone: [phone]  Patient: Anna [name]  Gender: Female  DOB: [birth year]  Age: 42 Years  PCP: Kerby Nora [facility])  Pregnant: No  Office Follow Up:  Does the office need to follow up with this patient?: Yes  Instructions For The Office: REQUEST RX  RN Note:  [facility] - located beside [place] office.  PLEASE REVIEW REQUEST FOR REFILL OF ANGELIQ RX WRITTEN BY OB  Symptoms  Reason For Call & Symptoms: Patient states her OB/GYN is closed today [facility]) and she is asking for a refill for Angeliq .5-1mg . that her OB/GYN prescribes.   she states that it goes with her Depo Lupron shot .  Advised caller that she should contact answering service for OB/GYN. She is requesting we send messag eto Dr. Ermalene Searing for assistance. Understanding expressed  Reviewed Health History In EMR: Yes  Reviewed Medications In EMR: Yes  Reviewed Allergies In EMR: Yes  Reviewed Surgeries / Procedures: Yes  Date of Onset of Symptoms: 02/12/2013 OB / GYN:  LMP: Unknown  Guideline(s) Used:  No Protocol Available - Information Only  Disposition Per Guideline:   Discuss with PCP and Callback by Nurse Today  Reason For Disposition Reached:   Nursing judgment  Advice Given:  Call Back If:  New symptoms develop  RN Overrode Recommendation:  Patient Requests Prescription  REQUEST rX

## 2013-02-15 NOTE — Telephone Encounter (Signed)
This has been taken care of.

## 2013-02-25 ENCOUNTER — Telehealth: Payer: Self-pay

## 2013-02-25 NOTE — Telephone Encounter (Signed)
Pt seen 02/02/13 and pt given antibiotic 02/11/13. Pt is better with cough; now cough is non productive and not coughing as often, no fever,S/T,earache or SOB. Pt has bad taste in mouth due to drainage at back of throat.Pt takiing Mucinex q4h and drinking a lot of water.Does pt need another antibiotic or does pt need to come for appt.Please advise.[facility].

## 2013-02-26 MED ORDER — LEVOFLOXACIN 500 MG PO TABS: 500.0000 mg | ORAL_TABLET | Freq: Every day | ORAL | Status: DC

## 2013-02-26 NOTE — Telephone Encounter (Signed)
Pt left v/m requesting call back [phone].

## 2013-02-26 NOTE — Telephone Encounter (Signed)
Liekly bacterial infection is resolved. Cough is likely post inflammatory or from allergy drainage.  Given cough another few weeks to resolve... But if not improving or shortness of breath have her make an appt to be seen.

## 2013-02-26 NOTE — Telephone Encounter (Signed)
Patient advised.

## 2013-02-26 NOTE — Telephone Encounter (Signed)
In that case will broaden antibiotics to levaquin x 10 days.

## 2013-02-26 NOTE — Telephone Encounter (Signed)
Patient says she was not calling about caugh she is calling about sinus pressure, drainage, headache, patient doesn't think the infection really ever got better and think she still has sinus infection

## 2013-06-22 ENCOUNTER — Other Ambulatory Visit: Payer: Self-pay

## 2013-06-22 DIAGNOSIS — Z1231 Encounter for screening mammogram for malignant neoplasm of breast: Secondary | ICD-10-CM

## 2013-07-15 ENCOUNTER — Ambulatory Visit
Admission: RE | Admit: 2013-07-15 | Discharge: 2013-07-15 | Disposition: A | Discharge status: Home / Self Care | Payer: 59 | Source: Ambulatory Visit

## 2013-07-15 DIAGNOSIS — Z1231 Encounter for screening mammogram for malignant neoplasm of breast: Secondary | ICD-10-CM

## 2013-07-20 ENCOUNTER — Other Ambulatory Visit: Payer: Self-pay | Admitting: Obstetrics and Gynecology

## 2013-07-20 DIAGNOSIS — R928 Other abnormal and inconclusive findings on diagnostic imaging of breast: Secondary | ICD-10-CM

## 2013-08-03 ENCOUNTER — Ambulatory Visit
Admission: RE | Admit: 2013-08-03 | Discharge: 2013-08-03 | Disposition: A | Discharge status: Home / Self Care | Payer: 59 | Source: Ambulatory Visit | Attending: Obstetrics and Gynecology | Admitting: Obstetrics and Gynecology

## 2013-08-03 ENCOUNTER — Other Ambulatory Visit: Payer: 59

## 2013-08-03 DIAGNOSIS — R928 Other abnormal and inconclusive findings on diagnostic imaging of breast: Secondary | ICD-10-CM

## 2013-09-02 ENCOUNTER — Other Ambulatory Visit: Payer: Self-pay

## 2014-04-12 ENCOUNTER — Ambulatory Visit: Payer: 59 | Admitting: Family Medicine

## 2014-06-17 ENCOUNTER — Encounter: Payer: Self-pay | Admitting: Family Medicine

## 2014-06-17 ENCOUNTER — Ambulatory Visit: Payer: 59 | Admitting: Internal Medicine

## 2014-06-17 ENCOUNTER — Ambulatory Visit: Payer: 59 | Admitting: Family Medicine

## 2014-06-17 ENCOUNTER — Ambulatory Visit (INDEPENDENT_AMBULATORY_CARE_PROVIDER_SITE_OTHER): Payer: 59 | Admitting: Family Medicine

## 2014-06-17 VITALS — BP 110/72 | HR 67 | Temp 98.2°F | Wt 161.8 lb

## 2014-06-17 DIAGNOSIS — J069 Acute upper respiratory infection, unspecified: Secondary | ICD-10-CM

## 2014-06-17 MED ORDER — AZITHROMYCIN 250 MG PO TABS: ORAL_TABLET | ORAL | Status: DC

## 2014-06-17 NOTE — Progress Notes (Signed)
Pre visit review using our clinic review tool, if applicable. No additional management support is needed unless otherwise documented below in the visit note.  Recently with sick contacts at home.  L eye with film and discharge but no vision changes.  Prev with a gritty feeling in the eye, L eye only.  No R sides sx.  Some better today.  Possible fever, no chills, no NAV.  Irritated throat, not stuffy but frontal pain noted.  Can't pop her ears. Sx for 1 week.   Meds, vitals, and allergies reviewed.   ROS: See HPI.  Otherwise, noncontributory.  GEN: nad, alert and oriented HEENT: mucous membranes moist, tm w/o erythema, nasal exam w/o erythema, clear discharge noted,  OP with cobblestoning NECK: supple w/o LA CV: rrr.   PULM: ctab, no inc wob EXT: no edema SKIN: no acute rash L eye with limbus sparing injection, frontal sinuses ttp B

## 2014-06-17 NOTE — Patient Instructions (Signed)
Use nasal saline and start the zithromax today.  Take care. Glad to see you.

## 2014-06-19 ENCOUNTER — Encounter: Payer: Self-pay | Admitting: Family Medicine

## 2014-06-19 DIAGNOSIS — J069 Acute upper respiratory infection, unspecified: Secondary | ICD-10-CM | POA: Insufficient documentation

## 2014-06-19 NOTE — Assessment & Plan Note (Signed)
Likely sinusitis +/- pinkeye.  D/w pt.  Best abx coverage likely zmax, supportive care in meantime and f/u prn. She agrees. Nontoxic.

## 2014-06-23 ENCOUNTER — Telehealth: Payer: Self-pay

## 2014-06-23 MED ORDER — FLUCONAZOLE 150 MG PO TABS: 150.0000 mg | ORAL_TABLET | Freq: Once | ORAL | Status: DC

## 2014-06-23 NOTE — Telephone Encounter (Signed)
Pt notified rx was sent to [facility]; pt voiced understanding.

## 2014-06-23 NOTE — Telephone Encounter (Signed)
Sent. Thanks.   

## 2014-06-23 NOTE — Telephone Encounter (Signed)
Pt left v/m; pt was seen last week and given antibiotic; pt has started with symptoms of yeast infection and request refill diflucan to [place].pt request cb when med sent to pharmacy.

## 2014-07-12 ENCOUNTER — Other Ambulatory Visit: Payer: Self-pay

## 2014-07-12 DIAGNOSIS — Z1231 Encounter for screening mammogram for malignant neoplasm of breast: Secondary | ICD-10-CM

## 2014-07-26 ENCOUNTER — Ambulatory Visit
Admission: RE | Admit: 2014-07-26 | Discharge: 2014-07-26 | Disposition: A | Discharge status: Home / Self Care | Payer: 59 | Source: Ambulatory Visit

## 2014-07-26 DIAGNOSIS — Z1231 Encounter for screening mammogram for malignant neoplasm of breast: Secondary | ICD-10-CM

## 2014-08-19 ENCOUNTER — Telehealth: Payer: Self-pay | Admitting: Family Medicine

## 2014-08-19 ENCOUNTER — Other Ambulatory Visit (INDEPENDENT_AMBULATORY_CARE_PROVIDER_SITE_OTHER): Payer: 59

## 2014-08-19 DIAGNOSIS — Z13220 Encounter for screening for lipoid disorders: Secondary | ICD-10-CM

## 2014-08-19 LAB — COMPREHENSIVE METABOLIC PANEL
ALBUMIN: 3.7 g/dL (ref 3.5–5.2)
ALK PHOS: 62 U/L (ref 39–117)
ALT: 14 U/L (ref 0–35)
AST: 21 U/L (ref 0–37)
BUN: 12 mg/dL (ref 6–23)
CO2: 19 mEq/L (ref 19–32)
Calcium: 9.3 mg/dL (ref 8.4–10.5)
Chloride: 103 mEq/L (ref 96–112)
Creatinine, Ser: 0.9 mg/dL (ref 0.4–1.2)
GFR: 77.04 mL/min (ref >60.00)
Glucose, Bld: 89 mg/dL (ref 70–99)
POTASSIUM: 4.1 meq/L (ref 3.5–5.1)
Sodium: 135 mEq/L (ref 135–145)
Total Bilirubin: 0.8 mg/dL (ref 0.2–1.2)
Total Protein: 7 g/dL (ref 6.0–8.3)

## 2014-08-19 LAB — LIPID PANEL
CHOL/HDL RATIO: 3
Cholesterol: 195 mg/dL (ref 0–200)
HDL: 67.4 mg/dL (ref >39.00)
LDL CALC: 112 mg/dL — AB (ref 0–99)
NonHDL: 127.6
TRIGLYCERIDES: 76 mg/dL (ref 0.0–149.0)
VLDL: 15.2 mg/dL (ref 0.0–40.0)

## 2014-08-19 NOTE — Telephone Encounter (Signed)
Message copied by Jinny Sanders on Fri Aug 19, 2014  8:28 AM ------      Message from: Ellamae Sia      Created: Mon Aug 15, 2014  2:44 PM      Regarding: Lab orders for Friday, 10.23.15       Patient is scheduled for CPX labs, please order future labs, Thanks , Terri       ------

## 2014-08-26 ENCOUNTER — Encounter: Payer: Self-pay | Admitting: Family Medicine

## 2014-08-26 ENCOUNTER — Ambulatory Visit (INDEPENDENT_AMBULATORY_CARE_PROVIDER_SITE_OTHER): Payer: 59 | Admitting: Family Medicine

## 2014-08-26 VITALS — BP 90/60 | HR 67 | Temp 98.1°F | Ht 67.0 in | Wt 159.8 lb

## 2014-08-26 DIAGNOSIS — J301 Allergic rhinitis due to pollen: Secondary | ICD-10-CM

## 2014-08-26 DIAGNOSIS — G43019 Migraine without aura, intractable, without status migrainosus: Secondary | ICD-10-CM

## 2014-08-26 DIAGNOSIS — J4540 Moderate persistent asthma, uncomplicated: Secondary | ICD-10-CM

## 2014-08-26 MED ORDER — SUMATRIPTAN SUCCINATE 100 MG PO TABS: ORAL_TABLET | ORAL | Status: DC

## 2014-08-26 NOTE — Progress Notes (Signed)
   Subjective:    Patient ID: Anna [name], female    DOB: 1971/07/29, 43 y.o.   MRN: [id number]  HPI  The patient is here for annual wellness exam and preventative care.   She has upcoming appt next wee with GYN for annual wellness.   No current problems other than her chronic abdomen symptoms for endometriosis.  Asthma, moderate persistent and allergies: Have improved significantly with use in last year of essential oils. Last asthma flare over 1 year ago.  LDL at goal < 130. Exercise: none. Diet: poor. Lab Results  Component Value Date   CHOL 195 08/19/2014   HDL 67.40 08/19/2014   LDLCALC 112* 08/19/2014   TRIG 76.0 08/19/2014   CHOLHDL 3 08/19/2014   Migraine: using 2-3 times a in last year. Uses imitrex as needed.   Review of Systems  Constitutional: Negative for fever and fatigue.  HENT: Negative for ear pain.   Eyes: Negative for pain.  Respiratory: Negative for chest tightness and shortness of breath.   Cardiovascular: Negative for chest pain, palpitations and leg swelling.  Gastrointestinal: Negative for abdominal pain.  Genitourinary: Negative for dysuria.       Objective:   Physical Exam  Constitutional: Vital signs are normal. She appears well-developed and well-nourished. She is cooperative.  Non-toxic appearance. She does not appear ill. No distress.  HENT:  Head: Normocephalic.  Right Ear: Hearing, tympanic membrane, external ear and ear canal normal.  Left Ear: Hearing, tympanic membrane, external ear and ear canal normal.  Nose: Nose normal.  Eyes: Conjunctivae, EOM and lids are normal. Pupils are equal, round, and reactive to light. Lids are everted and swept, no foreign bodies found.  Neck: Trachea normal and normal range of motion. Neck supple. Carotid bruit is not present. No mass and no thyromegaly present.  Cardiovascular: Normal rate, regular rhythm, S1 normal, S2 normal, normal heart sounds and intact distal pulses.  Exam reveals no gallop.    No murmur heard. Pulmonary/Chest: Effort normal and breath sounds normal. No respiratory distress. She has no wheezes. She has no rhonchi. She has no rales.  Abdominal: Soft. Normal appearance and bowel sounds are normal. She exhibits no distension, no fluid wave, no abdominal bruit and no mass. There is no hepatosplenomegaly. There is tenderness in the suprapubic area and left lower quadrant. There is no rebound, no guarding and no CVA tenderness. No hernia.  Lymphadenopathy:    She has no cervical adenopathy.    She has no axillary adenopathy.  Neurological: She is alert. She has normal strength. No cranial nerve deficit or sensory deficit.  Skin: Skin is warm, dry and intact. No rash noted.  Psychiatric: Her speech is normal and behavior is normal. Judgment normal. Her mood appears not anxious. Cognition and memory are normal. She does not exhibit a depressed mood.          Assessment & Plan:  The patient's preventative maintenance and recommended screening tests for an annual wellness exam were reviewed in full today. Brought up to date unless services declined.  Counselled on the importance of diet, exercise, and its role in overall health and mortality. The patient's FH and SH was reviewed, including their home life, tobacco status, and drug and alcohol status.   PAP/DVE: GYN Mammo: nml. Colon: no family hx of colon  Vaccines:

## 2014-08-26 NOTE — Assessment & Plan Note (Signed)
Well controlled OFF current meds.

## 2014-08-26 NOTE — Patient Instructions (Addendum)
Get back on track with healthy eating and start regular exercise. Consider prevnar vaccines.

## 2014-08-26 NOTE — Assessment & Plan Note (Signed)
Stable control on no preventative.

## 2014-08-26 NOTE — Assessment & Plan Note (Signed)
Stable control, improved with essential oils.

## 2014-08-26 NOTE — Progress Notes (Signed)
Pre visit review using our clinic review tool, if applicable. No additional management support is needed unless otherwise documented below in the visit note. 

## 2014-08-29 ENCOUNTER — Encounter: Payer: Self-pay | Admitting: Family Medicine

## 2015-04-07 ENCOUNTER — Encounter: Payer: Self-pay | Admitting: Internal Medicine

## 2015-06-13 ENCOUNTER — Other Ambulatory Visit: Payer: Self-pay | Admitting: Family Medicine

## 2015-06-13 NOTE — Telephone Encounter (Signed)
Electronic refill request. Last Filled:   08/26/14  Please advise.

## 2015-06-15 ENCOUNTER — Encounter: Payer: Self-pay | Admitting: Internal Medicine

## 2015-06-15 ENCOUNTER — Ambulatory Visit (INDEPENDENT_AMBULATORY_CARE_PROVIDER_SITE_OTHER): Payer: Commercial Managed Care - HMO | Admitting: Internal Medicine

## 2015-06-15 VITALS — BP 114/68 | HR 75 | Temp 98.5°F | Wt 164.0 lb

## 2015-06-15 DIAGNOSIS — B379 Candidiasis, unspecified: Secondary | ICD-10-CM

## 2015-06-15 DIAGNOSIS — T3695XA Adverse effect of unspecified systemic antibiotic, initial encounter: Secondary | ICD-10-CM

## 2015-06-15 DIAGNOSIS — J069 Acute upper respiratory infection, unspecified: Secondary | ICD-10-CM

## 2015-06-15 MED ORDER — AZITHROMYCIN 250 MG PO TABS: ORAL_TABLET | ORAL | Status: DC

## 2015-06-15 MED ORDER — HYDROCODONE-HOMATROPINE 5-1.5 MG/5ML PO SYRP: 5.0000 mL | ORAL_SOLUTION | Freq: Three times a day (TID) | ORAL | Status: DC | PRN

## 2015-06-15 MED ORDER — FLUCONAZOLE 150 MG PO TABS: 150.0000 mg | ORAL_TABLET | Freq: Once | ORAL | Status: DC

## 2015-06-15 NOTE — Progress Notes (Signed)
HPI  Pt presents to the clinic today with c/o runny nose, nasal congestin, ear pressure, sore throat and cough. This started 2 weeks ago. She is not blowing anything out of her nose. The cough is non productive. She does feel like she can hear a rattle when she breaths but she denies shortness of breath. She denies fever or chills but has been fatigued and had body aches. She has tried an OTC decongestant. She does have a history of allergies and asthma. She has also had contacts with people who have similar symptoms. She does not smoke.  Review of Systems      Past Medical History  Diagnosis Date  . Myalgia and myositis, unspecified   . Abdominal pain, other specified site   . Candidiasis of vulva and vagina   . Vaginitis and vulvovaginitis, unspecified   . Acute maxillary sinusitis   . Endometriosis of ovary   . Personal history of malignant neoplasm of thyroid   . Esophageal reflux   . Migraine without aura, without mention of intractable migraine without mention of status migrainosus   . Allergic rhinitis, cause unspecified   . Other atopic dermatitis and related conditions   . Unspecified tinnitus     Left  . Abnormal Pap smear     Age 91-17  . H/O varicella   . Mitral valve prolapse   . H/O varicose veins   . H/O bladder infections   . Pelvic pressure in female 2006  . Eczema 2006    Rectal  . Rectal itching 2006  . Fibroid   . Hemorrhoid 2007  . Vaginal candida 2007  . H/O dysmenorrhea 2008  . Leukorrhea 11/25/11  . H/O menorrhagia 2008  . Ovarian cyst 2008    Left  . Proteinuria 2010  . LLQ pain 2010  . Yeast vaginitis 2010  . Diverticulitis 08/29/11  . Endometriosis 08/29/11    Family History  Problem Relation Age of Onset  . Healthy Father   . Colon polyps Father   . Thyroid cancer Mother   . Irritable bowel syndrome Mother   . Healthy Brother   . Diabetes Maternal Grandmother   . Diabetes Maternal Grandfather   . Other Paternal Grandfather     CABG  age 41    Social History   Social History  . Marital Status: Married    Spouse Name: N/A  . Number of Children: N/A  . Years of Education: N/A   Occupational History  . stay at home mom; part time admin. asst    Social History Main Topics  . Smoking status: Former Research scientist (life sciences)  . Smokeless tobacco: Never Used     Comment: quit over 16 years ago  . Alcohol Use: 0.5 - 1.0 oz/week    1-2 drink(s) per week     Comment: wine; beer  . Drug Use: No  . Sexual Activity: Yes    Birth Control/ Protection: Injection     Comment: Husband had vasectomy   Other Topics Concern  . Not on file   Social History Narrative   Stay at home mom; part time administrative asst      Married      Children      Regular exercise-yes 3 days/week cardio 45 min      3 meals; fruit/veggies, no soda, lots of water    Allergies  Allergen Reactions  . Penicillins     REACTION: rash  . Sulfonamide Derivatives     REACTION: rash  Constitutional: Positive fatigue. Denies headache, fever or abrupt weight changes.  HEENT:  Positive runny nose, ear fullness, nasal congestion, and sore throat. Denies eye redness, eye pain, pressure behind the eyes, facial pain, ringing in the ears, wax buildup, or bloody nose. Respiratory: Positive cough. Denies difficulty breathing or shortness of breath.  Cardiovascular: Denies chest pain, chest tightness, palpitations or swelling in the hands or feet.   No other specific complaints in a complete review of systems (except as listed in HPI above).  Objective:   BP 114/68 mmHg  Pulse 75  Temp(Src) 98.5 F (36.9 C) (Oral)  Wt 164 lb (74.39 kg)  SpO2 99%  Wt Readings from Last 3 Encounters:  06/15/15 164 lb (74.39 kg)  08/26/14 159 lb 12 oz (72.462 kg)  06/17/14 161 lb 12 oz (73.369 kg)     General: Appears her stated age, in NAD. HEENT: Head: normal shape and size, no sinus tenderness noted; Eyes: sclera white, no icterus, conjunctiva pink, PERRLA and EOMs  intact; Ears: Tm's gray and intact, normal light reflex; Nose: mucosa boggy L>R and moist, septum midline; Throat/Mouth: Teeth present, mucosa pink and moist, no exudate noted, no lesions or ulcerations noted.  Neck: Slight anterior cervical lymphadenopathy.  Cardiovascular: Normal rate and rhythm. S1,S2 noted.  No murmur, rubs or gallops noted.  Pulmonary/Chest: Normal effort and positive vesicular breath sounds but more diminished in the RML. Bilateral inspiratory and expiratory wheezes noted in the bases. No respiratory distress.      Assessment & Plan:   Upper Respiratory Infection with Cough:  Get some rest and drink plenty of water Do salt water gargles for the sore throat eRx for Azithromax x 5 days eRx for Diflucan for antibiotic induced yeast infection Rx for Hycodan cough syrup Use albuterol prn for wheezing  RTC as needed or if symptoms persist.

## 2015-06-15 NOTE — Progress Notes (Signed)
Pre visit review using our clinic review tool, if applicable. No additional management support is needed unless otherwise documented below in the visit note. 

## 2015-06-15 NOTE — Patient Instructions (Signed)
Cough, Adult  A cough is a reflex that helps clear your throat and airways. It can help heal the body or may be a reaction to an irritated airway. A cough may only last 2 or 3 weeks (acute) or may last more than 8 weeks (chronic).  CAUSES Acute cough:  Viral or bacterial infections. Chronic cough:  Infections.  Allergies.  Asthma.  Post-nasal drip.  Smoking.  Heartburn or acid reflux.  Some medicines.  Chronic lung problems (COPD).  Cancer. SYMPTOMS   Cough.  Fever.  Chest pain.  Increased breathing rate.  High-pitched whistling sound when breathing (wheezing).  Colored mucus that you cough up (sputum). TREATMENT   A bacterial cough may be treated with antibiotic medicine.  A viral cough must run its course and will not respond to antibiotics.  Your caregiver may recommend other treatments if you have a chronic cough. HOME CARE INSTRUCTIONS   Only take over-the-counter or prescription medicines for pain, discomfort, or fever as directed by your caregiver. Use cough suppressants only as directed by your caregiver.  Use a cold steam vaporizer or humidifier in your bedroom or home to help loosen secretions.  Sleep in a semi-upright position if your cough is worse at night.  Rest as needed.  Stop smoking if you smoke. SEEK IMMEDIATE MEDICAL CARE IF:   You have pus in your sputum.  Your cough starts to worsen.  You cannot control your cough with suppressants and are losing sleep.  You begin coughing up blood.  You have difficulty breathing.  You develop pain which is getting worse or is uncontrolled with medicine.  You have a fever. MAKE SURE YOU:   Understand these instructions.  Will watch your condition.  Will get help right away if you are not doing well or get worse. Document Released: 04/12/2011 Document Revised: 01/06/2012 Document Reviewed: 04/12/2011 Exit[employer] Patient Information 2015 [employer]. This information is not intended  to replace advice given to you by your health care provider. Make sure you discuss any questions you have with your health care provider.  

## 2015-06-28 ENCOUNTER — Ambulatory Visit (INDEPENDENT_AMBULATORY_CARE_PROVIDER_SITE_OTHER): Payer: Commercial Managed Care - HMO | Admitting: Family Medicine

## 2015-06-28 ENCOUNTER — Encounter: Payer: Self-pay | Admitting: Family Medicine

## 2015-06-28 VITALS — BP 120/74 | HR 80 | Temp 98.2°F | Wt 162.0 lb

## 2015-06-28 DIAGNOSIS — J069 Acute upper respiratory infection, unspecified: Secondary | ICD-10-CM

## 2015-06-28 DIAGNOSIS — B379 Candidiasis, unspecified: Secondary | ICD-10-CM

## 2015-06-28 DIAGNOSIS — T3695XA Adverse effect of unspecified systemic antibiotic, initial encounter: Principal | ICD-10-CM

## 2015-06-28 MED ORDER — AZITHROMYCIN 250 MG PO TABS: ORAL_TABLET | ORAL | Status: DC

## 2015-06-28 MED ORDER — ALBUTEROL SULFATE HFA 108 (90 BASE) MCG/ACT IN AERS: 2.0000 | INHALATION_SPRAY | Freq: Four times a day (QID) | RESPIRATORY_TRACT | Status: DC | PRN

## 2015-06-28 MED ORDER — DEXAMETHASONE SODIUM PHOSPHATE 10 MG/ML IJ SOLN
10.0000 mg | Freq: Once | INTRAMUSCULAR | Status: AC
  Administered 2015-06-28: 10 mg via INTRAMUSCULAR

## 2015-06-28 MED ORDER — FLUCONAZOLE 150 MG PO TABS: 150.0000 mg | ORAL_TABLET | Freq: Once | ORAL | Status: DC

## 2015-06-28 NOTE — Progress Notes (Signed)
SUBJECTIVE:  Anna [name] is a 44 y.o. female pt of Dr. Diona Browner, new to me, who complains of coryza, congestion, swollen glands, productive cough and feeling like throat and chest are swolle for 3 days. She denies a history of fevers and admits to a history of asthma. Patient denies smoke cigarettes.   Of note, saw Webb Silversmith for URI symptoms on 8/16- note reviewed. Given Zpack and hycodan and advised albuterol prn wheezing.  Symptoms did get better but deteriorated over past 3 days. Current Outpatient Prescriptions on File Prior to Visit  Medication Sig Dispense Refill  . albuterol (PROVENTIL HFA;VENTOLIN HFA) 108 (90 BASE) MCG/ACT inhaler Inhale 2 puffs into the lungs every 6 (six) hours as needed for wheezing. 1 Inhaler 1  . Multiple Vitamin (MULTIVITAMIN) tablet Take 1 tablet by mouth daily.      . OMEGA 3 1200 MG CAPS Take 2 capsules by mouth once daily     . SUMAtriptan (IMITREX) 100 MG tablet May repeat in 2 hours if headache persists or recurs. 10 tablet 0   No current facility-administered medications on file prior to visit.    Allergies  Allergen Reactions  . Penicillins     REACTION: rash  . Sulfonamide Derivatives     REACTION: rash    Past Medical History  Diagnosis Date  . Myalgia and myositis, unspecified   . Abdominal pain, other specified site   . Candidiasis of vulva and vagina   . Vaginitis and vulvovaginitis, unspecified   . Acute maxillary sinusitis   . Endometriosis of ovary   . Personal history of malignant neoplasm of thyroid   . Esophageal reflux   . Migraine without aura, without mention of intractable migraine without mention of status migrainosus   . Allergic rhinitis, cause unspecified   . Other atopic dermatitis and related conditions   . Unspecified tinnitus     Left  . Abnormal Pap smear     Age 95-17  . H/O varicella   . Mitral valve prolapse   . H/O varicose veins   . H/O bladder infections   . Pelvic pressure in female 2006  .  Eczema 2006    Rectal  . Rectal itching 2006  . Fibroid   . Hemorrhoid 2007  . Vaginal candida 2007  . H/O dysmenorrhea 2008  . Leukorrhea 11/25/11  . H/O menorrhagia 2008  . Ovarian cyst 2008    Left  . Proteinuria 2010  . LLQ pain 2010  . Yeast vaginitis 2010  . Diverticulitis 08/29/11  . Endometriosis 08/29/11    Past Surgical History  Procedure Laterality Date  . Laparoscopic ovarian cystectomy    . Wisdom tooth extraction      Family History  Problem Relation Age of Onset  . Healthy Father   . Colon polyps Father   . Thyroid cancer Mother   . Irritable bowel syndrome Mother   . Healthy Brother   . Diabetes Maternal Grandmother   . Diabetes Maternal Grandfather   . Other Paternal Grandfather     CABG age 25    Social History   Social History  . Marital Status: Married    Spouse Name: N/A  . Number of Children: N/A  . Years of Education: N/A   Occupational History  . stay at home mom; part time admin. asst    Social History Main Topics  . Smoking status: Former Research scientist (life sciences)  . Smokeless tobacco: Never Used     Comment: quit over 16  years ago  . Alcohol Use: 0.5 - 1.0 oz/week    1-2 Standard drinks or equivalent per week     Comment: wine; beer  . Drug Use: No  . Sexual Activity: Yes    Birth Control/ Protection: Injection     Comment: Husband had vasectomy   Other Topics Concern  . Not on file   Social History Narrative   Stay at home mom; part time administrative asst      Married      Children      Regular exercise-yes 3 days/week cardio 45 min      3 meals; fruit/veggies, no soda, lots of water   The PMH, PSH, Social History, Family History, Medications, and allergies have been reviewed in [facility], and have been updated if relevant.   OBJECTIVE: BP 120/74 mmHg  Pulse 80  Temp(Src) 98.2 F (36.8 C) (Oral)  Wt 162 lb (73.483 kg)  SpO2 97%  LMP 06/02/2015  She appears well, vital signs are as noted. Ears normal.  Throat and pharynx normal.   Neck supple. No adenopathy in the neck. Nose is congested. Sinuses non tender. The chest is clear, without wheezes or rales.

## 2015-06-28 NOTE — Patient Instructions (Signed)
Good to see you. Please keep [place] updated.

## 2015-06-28 NOTE — Addendum Note (Signed)
Addended by: Modena Nunnery on: 06/28/2015 09:30 AM   Modules accepted: Orders

## 2015-06-28 NOTE — Assessment & Plan Note (Signed)
Deteriorated. Lung clear on exam today which is reassuring. IM decadron today for inflammation. eRx sent for zpack, diflucan and albuterol. Call or return to clinic prn if these symptoms worsen or fail to improve as anticipated. The patient indicates understanding of these issues and agrees with the plan.

## 2015-06-28 NOTE — Progress Notes (Signed)
Pre visit review using our clinic review tool, if applicable. No additional management support is needed unless otherwise documented below in the visit note. 

## 2015-07-14 ENCOUNTER — Encounter: Payer: Self-pay | Admitting: Family Medicine

## 2015-07-14 ENCOUNTER — Ambulatory Visit (INDEPENDENT_AMBULATORY_CARE_PROVIDER_SITE_OTHER): Payer: Commercial Managed Care - HMO | Admitting: Family Medicine

## 2015-07-14 VITALS — BP 100/76 | HR 78 | Temp 98.4°F | Ht 66.5 in | Wt 162.5 lb

## 2015-07-14 DIAGNOSIS — J4540 Moderate persistent asthma, uncomplicated: Secondary | ICD-10-CM

## 2015-07-14 DIAGNOSIS — J301 Allergic rhinitis due to pollen: Secondary | ICD-10-CM | POA: Diagnosis not present

## 2015-07-14 MED ORDER — FLUTICASONE-SALMETEROL 250-50 MCG/DOSE IN AEPB: 1.0000 | INHALATION_SPRAY | Freq: Two times a day (BID) | RESPIRATORY_TRACT | Status: DC

## 2015-07-14 MED ORDER — ALBUTEROL SULFATE HFA 108 (90 BASE) MCG/ACT IN AERS: 2.0000 | INHALATION_SPRAY | Freq: Four times a day (QID) | RESPIRATORY_TRACT | Status: DC | PRN

## 2015-07-14 NOTE — Progress Notes (Signed)
   Subjective:    Patient ID: Anna [name], female    DOB: [birth year], 44 y.o.   MRN: [id number]  HPI   44 year old female with cough and spells of tight throat feeling in last 2-3 months.   Seen in July and treated for bronchitis with antibiotics.. Was coughing productive, chest coldat that time. Resolved.   In end of August she had SOB, " fat throat feeling" spell for the first time.  Saw Dr Deborra Medina.. Had Z pack and steroid injection. Improved for a few weeks.   She continues with off and on spells of feeling of swelling in throat, increasing in frequency from every 4-5 days to every otherday. She does have continuous dry cough. She has nighttime cough.  After dusting under bed last night  ahe again had spell with feeling of squeezing in throat.. Feels like pressure in pharynx. SOB.  Albuterol  Usually does help but did not help last  night   PMH: moderate persistent asthma : on  No controlled medication, Using albuterol for rescue. HAd not had issues  In past 2 years until this summer. Using essential oils for allergies.  Singulair in past did not help.   Review of Systems  Constitutional: Negative for fever and fatigue.  HENT: Negative for ear pain.   Eyes: Negative for pain.  Cardiovascular: Negative for chest pain, palpitations and leg swelling.  Gastrointestinal: Negative for abdominal pain.  Genitourinary: Negative for dysuria.          Objective:   Physical Exam  Constitutional: Vital signs are normal. She appears well-developed and well-nourished. She is cooperative.  Non-toxic appearance. She does not appear ill. No distress.  HENT:  Head: Normocephalic.  Right Ear: Hearing, tympanic membrane, external ear and ear canal normal. Tympanic membrane is not erythematous, not retracted and not bulging.  Left Ear: Hearing, tympanic membrane, external ear and ear canal normal. Tympanic membrane is not erythematous, not retracted and not bulging.  Nose: No mucosal edema or  rhinorrhea. Right sinus exhibits no maxillary sinus tenderness and no frontal sinus tenderness. Left sinus exhibits no maxillary sinus tenderness and no frontal sinus tenderness.  Mouth/Throat: Uvula is midline, oropharynx is clear and moist and mucous membranes are normal.  Eyes: Conjunctivae, EOM and lids are normal. Pupils are equal, round, and reactive to light. Lids are everted and swept, no foreign bodies found.  Neck: Trachea normal and normal range of motion. Neck supple. Carotid bruit is not present. No thyroid mass and no thyromegaly present.  Cardiovascular: Normal rate, regular rhythm, S1 normal, S2 normal, normal heart sounds, intact distal pulses and normal pulses.  Exam reveals no gallop and no friction rub.   No murmur heard. Pulmonary/Chest: Effort normal and breath sounds normal. No tachypnea. No respiratory distress. She has no decreased breath sounds. She has no wheezes. She has no rhonchi. She has no rales.  Abdominal: Soft. Normal appearance and bowel sounds are normal. There is no tenderness.  Neurological: She is alert.  Skin: Skin is warm, dry and intact. No rash noted.  Psychiatric: Her speech is normal and behavior is normal. Judgment and thought content normal. Her mood appears not anxious. Cognition and memory are normal. She does not exhibit a depressed mood.          Assessment & Plan:

## 2015-07-14 NOTE — Assessment & Plan Note (Signed)
Inadequate control. Clearly a trigger for pt.

## 2015-07-14 NOTE — Progress Notes (Signed)
Pre visit review using our clinic review tool, if applicable. No additional management support is needed unless otherwise documented below in the visit note. 

## 2015-07-14 NOTE — Patient Instructions (Signed)
Start zyrtec at bedtime.  Start Advair twice daily.  Use albuterol for rescue.

## 2015-07-14 NOTE — Assessment & Plan Note (Addendum)
No sign of infection at this time. Spirometer broken at this time , cannot perform.  Peak flows today show: 310, expected 481.

## 2015-09-11 ENCOUNTER — Encounter: Payer: Self-pay | Admitting: Family Medicine

## 2016-01-16 ENCOUNTER — Ambulatory Visit: Payer: Commercial Managed Care - HMO | Admitting: Family Medicine

## 2016-02-29 ENCOUNTER — Encounter: Payer: Self-pay | Admitting: Internal Medicine

## 2016-02-29 ENCOUNTER — Ambulatory Visit (INDEPENDENT_AMBULATORY_CARE_PROVIDER_SITE_OTHER): Payer: Commercial Managed Care - HMO | Admitting: Internal Medicine

## 2016-02-29 VITALS — BP 108/74 | HR 78 | Temp 97.9°F | Wt 161.2 lb

## 2016-02-29 DIAGNOSIS — B9689 Other specified bacterial agents as the cause of diseases classified elsewhere: Secondary | ICD-10-CM

## 2016-02-29 DIAGNOSIS — B379 Candidiasis, unspecified: Secondary | ICD-10-CM | POA: Diagnosis not present

## 2016-02-29 DIAGNOSIS — T3695XA Adverse effect of unspecified systemic antibiotic, initial encounter: Secondary | ICD-10-CM

## 2016-02-29 DIAGNOSIS — J019 Acute sinusitis, unspecified: Secondary | ICD-10-CM | POA: Diagnosis not present

## 2016-02-29 MED ORDER — AZITHROMYCIN 250 MG PO TABS: ORAL_TABLET | ORAL | Status: DC

## 2016-02-29 MED ORDER — FLUCONAZOLE 150 MG PO TABS: 150.0000 mg | ORAL_TABLET | Freq: Once | ORAL | Status: DC

## 2016-02-29 NOTE — Patient Instructions (Signed)

## 2016-02-29 NOTE — Progress Notes (Signed)
HPI  Pt presents to the clinic today with c/o cough, sore throat, nasal congestion, and frontal headace. This started 1 week ago. She has a productive cough and rhinorrhea with green mucous. She also reports increased fatigue and left ear pain for the past 2 days. She denies fever, chills or body aches. She has been using essential oils and a neti pot without any relief. She has a h/o seasonal allergies and asthma. She has not had sick contacts.  Review of Systems    Past Medical History  Diagnosis Date  . Myalgia and myositis, unspecified   . Abdominal pain, other specified site   . Candidiasis of vulva and vagina   . Vaginitis and vulvovaginitis, unspecified   . Acute maxillary sinusitis   . Endometriosis of ovary   . Personal history of malignant neoplasm of thyroid   . Esophageal reflux   . Migraine without aura, without mention of intractable migraine without mention of status migrainosus   . Allergic rhinitis, cause unspecified   . Other atopic dermatitis and related conditions   . Unspecified tinnitus     Left  . Abnormal Pap smear     Age 32-17  . H/O varicella   . Mitral valve prolapse   . H/O varicose veins   . H/O bladder infections   . Pelvic pressure in female 2006  . Eczema 2006    Rectal  . Rectal itching 2006  . Fibroid   . Hemorrhoid 2007  . Vaginal candida 2007  . H/O dysmenorrhea 2008  . Leukorrhea 11/25/11  . H/O menorrhagia 2008  . Ovarian cyst 2008    Left  . Proteinuria 2010  . LLQ pain 2010  . Yeast vaginitis 2010  . Diverticulitis 08/29/11  . Endometriosis 08/29/11    Family History  Problem Relation Age of Onset  . Healthy Father   . Colon polyps Father   . Thyroid cancer Mother   . Irritable bowel syndrome Mother   . Healthy Brother   . Diabetes Maternal Grandmother   . Diabetes Maternal Grandfather   . Other Paternal Grandfather     CABG age 78    Social History   Social History  . Marital Status: Married    Spouse Name: N/A  .  Number of Children: N/A  . Years of Education: N/A   Occupational History  . stay at home mom; part time admin. asst    Social History Main Topics  . Smoking status: Former Research scientist (life sciences)  . Smokeless tobacco: Never Used     Comment: quit over 16 years ago  . Alcohol Use: 0.5 - 1.0 oz/week    1-2 Standard drinks or equivalent per week     Comment: wine; beer  . Drug Use: No  . Sexual Activity: Yes    Birth Control/ Protection: Injection     Comment: Husband had vasectomy   Other Topics Concern  . Not on file   Social History Narrative   Stay at home mom; part time administrative asst      Married      Children      Regular exercise-yes 3 days/week cardio 45 min      3 meals; fruit/veggies, no soda, lots of water    Allergies  Allergen Reactions  . Penicillins     REACTION: rash  . Sulfonamide Derivatives     REACTION: rash     Constitutional: Positive headache and fatigue. Denies fever.  HEENT:  Positive eye pain, facial  pain, nasal congestion, left ear pain, and sore throat. Denies eye redness, ringing in the ears, wax buildup, runny nose or bloody nose. Respiratory: Positive cough. Denies difficulty breathing or shortness of breath.  Cardiovascular: Denies chest pain, chest tightness, palpitations or swelling in the hands or feet.   No other specific complaints in a complete review of systems (except as listed in HPI above).  Objective:  BP 108/74 mmHg  Pulse 78  Temp(Src) 97.9 F (36.6 C) (Oral)  Wt 161 lb 4 oz (73.143 kg)  SpO2 98%   General: Appears her stated age, in NAD. HEENT: Head: normal shape and size, maxillary sinus tenderness noted; Eyes: sclera white, no icterus, conjunctiva pink; Ears: Tm's gray and intact, normal light reflex; Nose: mucosa boggy and moist, septum midline; Throat/Mouth: + PND. Teeth present, mucosa erythematous and moist, no exudate noted, no lesions or ulcerations noted.  Neck:  Cervical lymphadenopathy noted. Cardiovascular:  Normal rate and rhythm. S1,S2 noted.  No murmur, rubs or gallops noted.  Pulmonary/Chest: Normal effort and positive vesicular breath sounds. No respiratory distress. No wheezes, rales or ronchi noted.      Assessment & Plan:   Acute bacterial sinusitis  Continue neti pot and essential oils  eRx for Azithromycin x 5 days eRx for Diflucan for abx-induced yeast infections  RTC as needed or if symptoms persist.

## 2016-02-29 NOTE — Progress Notes (Signed)
Pre visit review using our clinic review tool, if applicable. No additional management support is needed unless otherwise documented below in the visit note. 

## 2016-06-26 ENCOUNTER — Other Ambulatory Visit: Payer: Self-pay | Admitting: Family Medicine

## 2016-06-26 NOTE — Telephone Encounter (Signed)
Last office visit 02/29/2016 with Baity.  Last office visit that addressed Migraines 08/26/2014.  Last refilled 08/26/2014 for #10.  Refill?

## 2016-11-07 DIAGNOSIS — Z01419 Encounter for gynecological examination (general) (routine) without abnormal findings: Secondary | ICD-10-CM | POA: Diagnosis not present

## 2016-11-07 DIAGNOSIS — L293 Anogenital pruritus, unspecified: Secondary | ICD-10-CM | POA: Diagnosis not present

## 2016-11-07 DIAGNOSIS — Z1231 Encounter for screening mammogram for malignant neoplasm of breast: Secondary | ICD-10-CM | POA: Diagnosis not present

## 2016-11-07 DIAGNOSIS — Z124 Encounter for screening for malignant neoplasm of cervix: Secondary | ICD-10-CM | POA: Diagnosis not present

## 2016-11-07 LAB — HM PAP SMEAR: HM PAP: NORMAL

## 2016-11-11 ENCOUNTER — Encounter: Payer: Self-pay | Admitting: Internal Medicine

## 2016-11-12 ENCOUNTER — Other Ambulatory Visit: Payer: Self-pay | Admitting: Obstetrics and Gynecology

## 2016-11-12 DIAGNOSIS — R928 Other abnormal and inconclusive findings on diagnostic imaging of breast: Secondary | ICD-10-CM

## 2016-11-18 ENCOUNTER — Ambulatory Visit
Admission: RE | Admit: 2016-11-18 | Discharge: 2016-11-18 | Disposition: A | Discharge status: Home / Self Care | Payer: Commercial Managed Care - HMO | Source: Ambulatory Visit | Attending: Obstetrics and Gynecology | Admitting: Obstetrics and Gynecology

## 2016-11-18 ENCOUNTER — Other Ambulatory Visit: Payer: Self-pay | Admitting: Obstetrics and Gynecology

## 2016-11-18 DIAGNOSIS — N6311 Unspecified lump in the right breast, upper outer quadrant: Secondary | ICD-10-CM | POA: Diagnosis not present

## 2016-11-18 DIAGNOSIS — N6321 Unspecified lump in the left breast, upper outer quadrant: Secondary | ICD-10-CM | POA: Diagnosis not present

## 2016-11-18 DIAGNOSIS — R928 Other abnormal and inconclusive findings on diagnostic imaging of breast: Secondary | ICD-10-CM

## 2016-11-18 DIAGNOSIS — N6310 Unspecified lump in the right breast, unspecified quadrant: Secondary | ICD-10-CM | POA: Diagnosis not present

## 2016-11-22 ENCOUNTER — Other Ambulatory Visit: Payer: Self-pay | Admitting: Obstetrics and Gynecology

## 2016-11-22 DIAGNOSIS — L280 Lichen simplex chronicus: Secondary | ICD-10-CM | POA: Diagnosis not present

## 2016-12-09 DIAGNOSIS — H5203 Hypermetropia, bilateral: Secondary | ICD-10-CM | POA: Diagnosis not present

## 2017-01-09 DIAGNOSIS — L280 Lichen simplex chronicus: Secondary | ICD-10-CM | POA: Diagnosis not present

## 2017-05-05 ENCOUNTER — Other Ambulatory Visit: Payer: Commercial Managed Care - HMO

## 2017-05-08 ENCOUNTER — Encounter: Payer: Commercial Managed Care - HMO | Admitting: Family Medicine

## 2017-05-21 DIAGNOSIS — R1032 Left lower quadrant pain: Secondary | ICD-10-CM | POA: Diagnosis not present

## 2017-05-21 DIAGNOSIS — M545 Low back pain: Secondary | ICD-10-CM | POA: Diagnosis not present

## 2017-06-12 ENCOUNTER — Telehealth: Payer: Self-pay | Admitting: Family Medicine

## 2017-06-12 DIAGNOSIS — Z13220 Encounter for screening for lipoid disorders: Secondary | ICD-10-CM

## 2017-06-12 NOTE — Telephone Encounter (Signed)
-----   Message from Ellamae Sia sent at 06/05/2017 11:45 AM EDT ----- Regarding: Lab orders for Friday, 8.17.18 Patient is scheduled for CPX labs, please order future labs, Thanks , Karna Christmas

## 2017-06-13 ENCOUNTER — Other Ambulatory Visit: Payer: 59

## 2017-06-19 ENCOUNTER — Encounter: Payer: 59 | Admitting: Family Medicine

## 2017-06-27 ENCOUNTER — Telehealth: Payer: Self-pay | Admitting: Family Medicine

## 2017-06-27 ENCOUNTER — Encounter: Payer: Self-pay | Admitting: Primary Care

## 2017-06-27 ENCOUNTER — Ambulatory Visit (INDEPENDENT_AMBULATORY_CARE_PROVIDER_SITE_OTHER): Payer: 59 | Admitting: Primary Care

## 2017-06-27 ENCOUNTER — Telehealth: Payer: Self-pay

## 2017-06-27 VITALS — BP 110/76 | HR 74 | Temp 98.3°F | Ht 66.5 in | Wt 168.1 lb

## 2017-06-27 DIAGNOSIS — B379 Candidiasis, unspecified: Secondary | ICD-10-CM

## 2017-06-27 DIAGNOSIS — L03115 Cellulitis of right lower limb: Secondary | ICD-10-CM | POA: Diagnosis not present

## 2017-06-27 DIAGNOSIS — T3695XA Adverse effect of unspecified systemic antibiotic, initial encounter: Principal | ICD-10-CM

## 2017-06-27 MED ORDER — DOXYCYCLINE HYCLATE 100 MG PO TABS: 100.0000 mg | ORAL_TABLET | Freq: Two times a day (BID) | ORAL | 0 refills | Status: DC

## 2017-06-27 MED ORDER — FLUCONAZOLE 150 MG PO TABS: ORAL_TABLET | ORAL | 0 refills | Status: DC

## 2017-06-27 NOTE — Telephone Encounter (Signed)
Patient Name: Anna [name] DOB: 28-Oct-1971 Initial Comment Caller states she got bit by a fire ant. On her toe, red and swelling. oozing. May be infected. Skin around it is swelling. Nurse Assessment Nurse: Yolanda Bonine, RN, Erin Date/Time (Eastern Time): 06/27/2017 9:25:49 AM Confirm and document reason for call. If symptomatic, describe symptoms. ---Caller states she got bit by a fire ant. On right foot On her toe, red and swelling. oozing clear fluid. May be infected. Skin around it is swelling. She states she has an appt at 1045 this morning at [facility]. No fever Bite happened wednesday night Does the patient have any new or worsening symptoms? ---Yes Will a triage be completed? ---Yes Related visit to physician within the last 2 weeks? ---No Does the PT have any chronic conditions? (i.e. diabetes, asthma, etc.) ---No Is the patient pregnant or possibly pregnant? (Ask all females between the ages of 46-55) ---No Is this a behavioral health or substance abuse call? ---No Guidelines Guideline Title Affirmed Question Affirmed Notes Insect Bite [1] Red streak or red line AND [2] length > 2 inches (5 cm) Final Disposition User See Physician within 4 Hours (or PCP triage) Yolanda Bonine, RN, Erin Referrals GO TO FACILITY REFUSED Disagree/Comply: Comply

## 2017-06-27 NOTE — Patient Instructions (Signed)
Start Doxycycline antibiotic. Take 1 tablet by mouth twice daily for 10 days.  Apply ice and elevate your foot 3 times daily.  Try taking Ibuprofen 600 mg three times daily for pain and inflammation.  Continue Benadryl at bedtime as needed for itching.   Schedule a follow up visit with either myself or Dr. Diona Browner in 1 week.  It was a pleasure meeting you!

## 2017-06-27 NOTE — Telephone Encounter (Signed)
Pt left v/m; pt was seen earlier today and given an abx and pt forgot to ask for diflucan x 2; hx pt has to take diflucan after abx therapy. Pt request cb when done. [place].

## 2017-06-27 NOTE — Telephone Encounter (Signed)
Spoken and notified patient of Kate's comments. Patient verbalized understanding. 

## 2017-06-27 NOTE — Telephone Encounter (Signed)
Pt has appt 06/27/17 at 10:45 with Allie Bossier NP.

## 2017-06-27 NOTE — Telephone Encounter (Signed)
Patient evaluated and treated. 

## 2017-06-27 NOTE — Telephone Encounter (Signed)
Diflucan sent to pharmacy.

## 2017-06-27 NOTE — Progress Notes (Signed)
Subjective:    Patient ID: Anna [name], female    DOB: [birth year], 46 y.o.   MRN: [id number]  HPI  Ms. Scatena is a 46 year old female with a history of eczema who presents today with a chief complaint of foot swelling. She also reports erythema, pain, blistering to the site of the bite, and a red streak that is moving upward from the site of injury to her ankle. Her symptoms are located to the right anterior foot that began two days ago after she was bitten by a fire while working in the garden. She's been applying a prescription antibiotic ointment last night and today without much improvement. She's also taken benadryl without improvement. She denies fevers.  Review of Systems  Constitutional: Negative for fever.  Cardiovascular: Negative for palpitations.  Skin: Positive for color change and wound.  Neurological: Negative for weakness.       Past Medical History:  Diagnosis Date  . Abdominal pain, other specified site   . Abnormal Pap smear    Age 29-17  . Acute maxillary sinusitis   . Allergic rhinitis, cause unspecified   . Candidiasis of vulva and vagina   . Diverticulitis 08/29/11  . Eczema 2006   Rectal  . Endometriosis 08/29/11  . Endometriosis of ovary   . Esophageal reflux   . Fibroid   . H/O bladder infections   . H/O dysmenorrhea 2008  . H/O menorrhagia 2008  . H/O varicella   . H/O varicose veins   . Hemorrhoid 2007  . Leukorrhea 11/25/11  . LLQ pain 2010  . Migraine without aura, without mention of intractable migraine without mention of status migrainosus   . Mitral valve prolapse   . Myalgia and myositis, unspecified   . Other atopic dermatitis and related conditions   . Ovarian cyst 2008   Left  . Pelvic pressure in female 2006  . Personal history of malignant neoplasm of thyroid   . Proteinuria 2010  . Rectal itching 2006  . Unspecified tinnitus    Left  . Vaginal candida 2007  . Vaginitis and vulvovaginitis, unspecified   . Yeast vaginitis 2010      Social History   Social History  . Marital status: Married    Spouse name: N/A  . Number of children: N/A  . Years of education: N/A   Occupational History  . stay at home mom; part time admin. asst [employer]   Social History Main Topics  . Smoking status: Former Research scientist (life sciences)  . Smokeless tobacco: Never Used     Comment: quit over 16 years ago  . Alcohol use 0.5 - 1.0 oz/week    1 - 2 Standard drinks or equivalent per week     Comment: wine; beer  . Drug use: No  . Sexual activity: Yes    Birth control/ protection: Injection     Comment: Husband had vasectomy   Other Topics Concern  . Not on file   Social History Narrative   Stay at home mom; part time administrative asst      Married      Children      Regular exercise-yes 3 days/week cardio 45 min      3 meals; fruit/veggies, no soda, lots of water    Past Surgical History:  Procedure Laterality Date  . LAPAROSCOPIC OVARIAN CYSTECTOMY    . WISDOM TOOTH EXTRACTION      Family History  Problem Relation Age of Onset  . Healthy  Father   . Colon polyps Father   . Thyroid cancer Mother   . Irritable bowel syndrome Mother   . Healthy Brother   . Diabetes Maternal Grandmother   . Diabetes Maternal Grandfather   . Other Paternal Grandfather        CABG age 76    Allergies  Allergen Reactions  . Penicillins     REACTION: rash  . Sulfonamide Derivatives     REACTION: rash    Current Outpatient Prescriptions on File Prior to Visit  Medication Sig Dispense Refill  . albuterol (PROVENTIL HFA;VENTOLIN HFA) 108 (90 BASE) MCG/ACT inhaler Inhale 2 puffs into the lungs every 6 (six) hours as needed for wheezing. 1 Inhaler 1  . Fluticasone-Salmeterol (ADVAIR DISKUS) 250-50 MCG/DOSE AEPB Inhale 1 puff into the lungs 2 (two) times daily. 60 each 6  . Multiple Vitamin (MULTIVITAMIN) tablet Take 1 tablet by mouth daily.      . OMEGA 3 1200 MG CAPS Take 2 capsules by mouth once daily     . SUMAtriptan  (IMITREX) 100 MG tablet TAKE ONE TABLET AS NEEDED FOR MIGRAINE. MAY REPEAT IN TWO HOURS IF NEEDED *MAX OF 2 TABLETS IN 24 HOURS* 9 tablet 0   No current facility-administered medications on file prior to visit.     BP 110/76   Pulse 74   Temp 98.3 F (36.8 C) (Oral)   Ht 5' 6.5" (1.689 m)   Wt 168 lb 1.9 oz (76.3 kg)   LMP 06/22/2017   SpO2 97%   BMI 26.73 kg/m    Objective:   Physical Exam  Constitutional: She appears well-nourished.  Cardiovascular: Normal rate.   Pulses:      Dorsalis pedis pulses are 2+ on the right side.       Posterior tibial pulses are 2+ on the right side.  Pulmonary/Chest: Effort normal.  Skin: Skin is warm and dry. There is erythema.  Moderate swelling to right dorsal foot, erythema, warmth. 0.25 cm blister to anterior 4th toe. Mild streaking noted from mid dorsal foot to mid anterior ankle.          Assessment & Plan:  Cellulitis:  Exam today with strong suspicion for early cellulitis. Rx for Doxycycline course sent to pharmacy (PCN and Sulfa allergy). Discussed to elevate, ice, take NSAID's for pain and swelling. Follow up in 1 week for re-evaluation.  Sheral Flow, NP

## 2017-07-11 ENCOUNTER — Other Ambulatory Visit: Payer: Self-pay | Admitting: Family Medicine

## 2017-07-11 ENCOUNTER — Other Ambulatory Visit: Payer: 59

## 2017-07-11 DIAGNOSIS — Z0000 Encounter for general adult medical examination without abnormal findings: Secondary | ICD-10-CM

## 2017-07-17 ENCOUNTER — Ambulatory Visit (INDEPENDENT_AMBULATORY_CARE_PROVIDER_SITE_OTHER): Payer: 59 | Admitting: Family Medicine

## 2017-07-17 ENCOUNTER — Encounter: Payer: Self-pay | Admitting: *Deleted

## 2017-07-17 ENCOUNTER — Encounter: Payer: Self-pay | Admitting: Family Medicine

## 2017-07-17 VITALS — BP 98/68 | HR 64 | Temp 98.4°F | Ht 67.5 in | Wt 171.2 lb

## 2017-07-17 DIAGNOSIS — R5383 Other fatigue: Secondary | ICD-10-CM | POA: Diagnosis not present

## 2017-07-17 DIAGNOSIS — Z13220 Encounter for screening for lipoid disorders: Secondary | ICD-10-CM | POA: Diagnosis not present

## 2017-07-17 DIAGNOSIS — Z0000 Encounter for general adult medical examination without abnormal findings: Secondary | ICD-10-CM

## 2017-07-17 LAB — T3, FREE: T3 FREE: 2.9 pg/mL (ref 2.3–4.2)

## 2017-07-17 LAB — CBC WITH DIFFERENTIAL/PLATELET
Basophils Absolute: 0 10*3/uL (ref 0.0–0.1)
Basophils Relative: 0.4 % (ref 0.0–3.0)
Eosinophils Absolute: 0.3 10*3/uL (ref 0.0–0.7)
Eosinophils Relative: 6 % — ABNORMAL HIGH (ref 0.0–5.0)
HCT: 40.8 % (ref 36.0–46.0)
Hemoglobin: 14 g/dL (ref 12.0–15.0)
Lymphocytes Relative: 39.8 % (ref 12.0–46.0)
Lymphs Abs: 2.1 10*3/uL (ref 0.7–4.0)
MCHC: 34.3 g/dL (ref 30.0–36.0)
MCV: 91.5 fl (ref 78.0–100.0)
Monocytes Absolute: 0.5 10*3/uL (ref 0.1–1.0)
Monocytes Relative: 9.3 % (ref 3.0–12.0)
Neutro Abs: 2.3 10*3/uL (ref 1.4–7.7)
Neutrophils Relative %: 44.5 % (ref 43.0–77.0)
Platelets: 204 10*3/uL (ref 150.0–400.0)
RBC: 4.46 Mil/uL (ref 3.87–5.11)
RDW: 13.4 % (ref 11.5–15.5)
WBC: 5.3 10*3/uL (ref 4.0–10.5)

## 2017-07-17 LAB — T4, FREE: Free T4: 0.81 ng/dL (ref 0.60–1.60)

## 2017-07-17 LAB — COMPREHENSIVE METABOLIC PANEL
ALK PHOS: 43 U/L (ref 39–117)
ALT: 15 U/L (ref 0–35)
AST: 19 U/L (ref 0–37)
Albumin: 4.1 g/dL (ref 3.5–5.2)
BILIRUBIN TOTAL: 0.5 mg/dL (ref 0.2–1.2)
BUN: 8 mg/dL (ref 6–23)
CALCIUM: 9.4 mg/dL (ref 8.4–10.5)
CO2: 28 meq/L (ref 19–32)
Chloride: 102 mEq/L (ref 96–112)
Creatinine, Ser: 0.76 mg/dL (ref 0.40–1.20)
GFR: 87.64 mL/min (ref >60.00)
GLUCOSE: 90 mg/dL (ref 70–99)
Potassium: 4.3 mEq/L (ref 3.5–5.1)
Sodium: 135 mEq/L (ref 135–145)
TOTAL PROTEIN: 6.8 g/dL (ref 6.0–8.3)

## 2017-07-17 LAB — LIPID PANEL
Cholesterol: 213 mg/dL — ABNORMAL HIGH (ref 0–200)
HDL: 88 mg/dL
LDL Cholesterol: 107 mg/dL — ABNORMAL HIGH (ref 0–99)
NonHDL: 125.46
Total CHOL/HDL Ratio: 2
Triglycerides: 94 mg/dL (ref 0.0–149.0)
VLDL: 18.8 mg/dL (ref 0.0–40.0)

## 2017-07-17 LAB — TSH: TSH: 2.58 u[IU]/mL (ref 0.35–4.50)

## 2017-07-17 MED ORDER — SUMATRIPTAN SUCCINATE 100 MG PO TABS: ORAL_TABLET | ORAL | 1 refills | Status: DC

## 2017-07-17 MED ORDER — ALBUTEROL SULFATE HFA 108 (90 BASE) MCG/ACT IN AERS: 2.0000 | INHALATION_SPRAY | Freq: Four times a day (QID) | RESPIRATORY_TRACT | 3 refills | Status: DC | PRN

## 2017-07-17 NOTE — Progress Notes (Signed)
Subjective:    Patient ID: Anna [name], female    DOB: 1971-04-26, 46 y.o.   MRN: [id number]  HPI The patient is here for annual wellness exam and preventative care.   Doing well overall.  Exercise:  3-4 days a week.  Diet: moderate, decreasing portion size.  She is having issue losing weight.  Swelling later in the day.  Limiting salt.  Occ constipation. No hair loss.  No cold intolerance.  Mother with thyroid cancer.  Wt Readings from Last 3 Encounters:  07/17/17 171 lb 4 oz (77.7 kg)  06/27/17 168 lb 1.9 oz (76.3 kg)  02/29/16 161 lb 4 oz (73.1 kg)   She has noted intermittant "swimmy headedness when moving head quickly or in car/boat.  no orthostatic symptoms.  Allergies.. Fairly well controlled.  no hearing loss, no tinnitus.  Body mass index is 26.43 kg/m.  Social History /Family History/Past Medical History reviewed in detail and updated in EMR if needed. Blood pressure 98/68, pulse 64, temperature 98.4 F (36.9 C), temperature source Oral, height 5' 7.5" (1.715 m), weight 171 lb 4 oz (77.7 kg), last menstrual period 06/22/2017.     Review of Systems  Constitutional: Positive for fatigue and unexpected weight change. Negative for fever.  HENT: Negative for congestion.   Eyes: Negative for pain.  Respiratory: Negative for cough and shortness of breath.   Cardiovascular: Positive for leg swelling. Negative for chest pain and palpitations.  Gastrointestinal: Negative for abdominal pain.  Genitourinary: Negative for dysuria and vaginal bleeding.  Musculoskeletal: Negative for back pain.  Neurological: Negative for syncope, light-headedness and headaches.  Psychiatric/Behavioral: Negative for dysphoric mood.       Objective:   Physical Exam  Constitutional: Vital signs are normal. She appears well-developed and well-nourished. She is cooperative.  Non-toxic appearance. She does not appear ill. No distress.  HENT:  Head: Normocephalic.  Right Ear: Hearing,  tympanic membrane, external ear and ear canal normal.  Left Ear: Hearing, tympanic membrane, external ear and ear canal normal.  Nose: Nose normal.  Eyes: Pupils are equal, round, and reactive to light. Conjunctivae, EOM and lids are normal. Lids are everted and swept, no foreign bodies found.  Neck: Trachea normal and normal range of motion. Neck supple. Carotid bruit is not present. No thyroid mass and no thyromegaly present.  Cardiovascular: Normal rate, regular rhythm, S1 normal, S2 normal, normal heart sounds and intact distal pulses.  Exam reveals no gallop.   No murmur heard. Pulmonary/Chest: Effort normal and breath sounds normal. No respiratory distress. She has no wheezes. She has no rhonchi. She has no rales.  Abdominal: Soft. Normal appearance and bowel sounds are normal. She exhibits no distension, no fluid wave, no abdominal bruit and no mass. There is no hepatosplenomegaly. There is no tenderness. There is no rebound, no guarding and no CVA tenderness. No hernia.  Lymphadenopathy:    She has no cervical adenopathy.    She has no axillary adenopathy.  Neurological: She is alert. She has normal strength. No cranial nerve deficit or sensory deficit.  Skin: Skin is warm, dry and intact. No rash noted.  Psychiatric: Her speech is normal and behavior is normal. Judgment normal. Her mood appears not anxious. Cognition and memory are normal. She does not exhibit a depressed mood.          Assessment & Plan:  The patient's preventative maintenance and recommended screening tests for an annual wellness exam were reviewed in full today. Brought up to  date unless services declined.  Counselled on the importance of diet, exercise, and its role in overall health and mortality. The patient's FH and SH was reviewed, including their home life, tobacco status, and drug and alcohol status.   Vaccines: Refused flu Pap/DVE:  Followed by GYN, Dr. Cletis Media Mammo:  10/2016 Colon: no early family  history  Smoking Status: none ETOH/ drug use: 1-2 glasses of wine a week/none  HIV screen:   refused

## 2017-07-17 NOTE — Patient Instructions (Addendum)
We will call with lab results.  Keep working on healthy lifestyle!

## 2017-07-17 NOTE — Addendum Note (Signed)
Addended by: Ellamae Sia on: 07/17/2017 10:11 AM   Modules accepted: Orders

## 2017-08-15 DIAGNOSIS — H40033 Anatomical narrow angle, bilateral: Secondary | ICD-10-CM | POA: Diagnosis not present

## 2017-08-18 DIAGNOSIS — H40033 Anatomical narrow angle, bilateral: Secondary | ICD-10-CM | POA: Diagnosis not present

## 2017-09-09 DIAGNOSIS — H40031 Anatomical narrow angle, right eye: Secondary | ICD-10-CM | POA: Diagnosis not present

## 2017-09-22 DIAGNOSIS — H40033 Anatomical narrow angle, bilateral: Secondary | ICD-10-CM | POA: Diagnosis not present

## 2017-11-03 ENCOUNTER — Other Ambulatory Visit: Payer: Self-pay | Admitting: Obstetrics and Gynecology

## 2017-11-03 DIAGNOSIS — Z139 Encounter for screening, unspecified: Secondary | ICD-10-CM

## 2017-11-10 DIAGNOSIS — L738 Other specified follicular disorders: Secondary | ICD-10-CM | POA: Diagnosis not present

## 2017-11-10 DIAGNOSIS — L570 Actinic keratosis: Secondary | ICD-10-CM | POA: Diagnosis not present

## 2017-11-10 DIAGNOSIS — D2261 Melanocytic nevi of right upper limb, including shoulder: Secondary | ICD-10-CM | POA: Diagnosis not present

## 2017-11-10 DIAGNOSIS — D1801 Hemangioma of skin and subcutaneous tissue: Secondary | ICD-10-CM | POA: Diagnosis not present

## 2017-11-25 DIAGNOSIS — Z01419 Encounter for gynecological examination (general) (routine) without abnormal findings: Secondary | ICD-10-CM | POA: Diagnosis not present

## 2017-11-25 DIAGNOSIS — L900 Lichen sclerosus et atrophicus: Secondary | ICD-10-CM | POA: Diagnosis not present

## 2017-11-25 DIAGNOSIS — N643 Galactorrhea not associated with childbirth: Secondary | ICD-10-CM | POA: Diagnosis not present

## 2017-11-27 ENCOUNTER — Ambulatory Visit: Payer: 59

## 2017-12-04 ENCOUNTER — Ambulatory Visit
Admission: RE | Admit: 2017-12-04 | Discharge: 2017-12-04 | Disposition: A | Discharge status: Home / Self Care | Payer: 59 | Source: Ambulatory Visit | Attending: Obstetrics and Gynecology | Admitting: Obstetrics and Gynecology

## 2017-12-04 DIAGNOSIS — Z1231 Encounter for screening mammogram for malignant neoplasm of breast: Secondary | ICD-10-CM | POA: Diagnosis not present

## 2017-12-04 DIAGNOSIS — Z139 Encounter for screening, unspecified: Secondary | ICD-10-CM

## 2018-02-11 ENCOUNTER — Encounter: Payer: Self-pay | Admitting: *Deleted

## 2018-02-12 ENCOUNTER — Other Ambulatory Visit: Payer: Self-pay

## 2018-02-12 ENCOUNTER — Encounter: Payer: Self-pay | Admitting: Family Medicine

## 2018-02-12 ENCOUNTER — Ambulatory Visit: Payer: 59 | Admitting: Family Medicine

## 2018-02-12 VITALS — BP 100/60 | HR 74 | Temp 98.3°F | Ht 67.5 in | Wt 164.2 lb

## 2018-02-12 DIAGNOSIS — R1013 Epigastric pain: Secondary | ICD-10-CM

## 2018-02-12 DIAGNOSIS — K219 Gastro-esophageal reflux disease without esophagitis: Secondary | ICD-10-CM

## 2018-02-12 LAB — CBC WITH DIFFERENTIAL/PLATELET
BASOS ABS: 0 10*3/uL (ref 0.0–0.1)
BASOS PCT: 0.3 % (ref 0.0–3.0)
EOS ABS: 0.3 10*3/uL (ref 0.0–0.7)
Eosinophils Relative: 3.7 % (ref 0.0–5.0)
HEMATOCRIT: 42 % (ref 36.0–46.0)
Hemoglobin: 14.6 g/dL (ref 12.0–15.0)
LYMPHS ABS: 1.8 10*3/uL (ref 0.7–4.0)
Lymphocytes Relative: 26.6 % (ref 12.0–46.0)
MCHC: 34.7 g/dL (ref 30.0–36.0)
MCV: 90.1 fl (ref 78.0–100.0)
MONOS PCT: 9.5 % (ref 3.0–12.0)
Monocytes Absolute: 0.7 10*3/uL (ref 0.1–1.0)
NEUTROS ABS: 4.1 10*3/uL (ref 1.4–7.7)
NEUTROS PCT: 59.9 % (ref 43.0–77.0)
PLATELETS: 221 10*3/uL (ref 150.0–400.0)
RBC: 4.67 Mil/uL (ref 3.87–5.11)
RDW: 13.3 % (ref 11.5–15.5)
WBC: 6.9 10*3/uL (ref 4.0–10.5)

## 2018-02-12 LAB — COMPREHENSIVE METABOLIC PANEL
ALT: 13 U/L (ref 0–35)
AST: 17 U/L (ref 0–37)
Albumin: 4.2 g/dL (ref 3.5–5.2)
Alkaline Phosphatase: 55 U/L (ref 39–117)
BILIRUBIN TOTAL: 0.5 mg/dL (ref 0.2–1.2)
BUN: 12 mg/dL (ref 6–23)
CHLORIDE: 103 meq/L (ref 96–112)
CO2: 28 meq/L (ref 19–32)
CREATININE: 0.73 mg/dL (ref 0.40–1.20)
Calcium: 9.5 mg/dL (ref 8.4–10.5)
GFR: 91.57 mL/min (ref >60.00)
GLUCOSE: 74 mg/dL (ref 70–99)
Potassium: 4.3 mEq/L (ref 3.5–5.1)
Sodium: 137 mEq/L (ref 135–145)
Total Protein: 6.9 g/dL (ref 6.0–8.3)

## 2018-02-12 LAB — LIPASE: Lipase: 45 U/L (ref 11.0–59.0)

## 2018-02-12 MED ORDER — SUCRALFATE 1 GM/10ML PO SUSP: 1.0000 g | Freq: Three times a day (TID) | ORAL | 0 refills | Status: DC

## 2018-02-12 MED ORDER — PANTOPRAZOLE SODIUM 40 MG PO TBEC: 40.0000 mg | DELAYED_RELEASE_TABLET | Freq: Every day | ORAL | 1 refills | Status: DC

## 2018-02-12 NOTE — Progress Notes (Signed)
   Subjective:    Patient ID: Anna [name], female    DOB: October 08, 1971, 47 y.o.   MRN: [id number]  HPI   47 year old female present with new onset  indigestion in last month.   Burning in chest, burping.  Worsened one night after wine and pizza.  Pain radiated to jaw and back.   Start prilosec 20 mg daily , zantac. Improved reflux but started with burning in epigastrium.   Cannot get rid of feeling of sensation of " ball " in throat upper chest.  Burning in back  Throat, sour taste in throat.  Decreased appetite. Early satiety.  Has changed to bland diet.  Water only.  No blood in stool, no black stool.   She has increased to 40 mg prilosec  X 4 days ago... Helped slightly more.   No hx of PUD, pancreatitis.  No new medications, no NSAIDs.   Social History /Family History/Past Medical History reviewed in detail and updated in EMR if needed. Blood pressure 100/60, pulse 74, temperature 98.3 F (36.8 C), temperature source Oral, height 5' 7.5" (1.715 m), weight 164 lb 4 oz (74.5 kg), last menstrual period 02/10/2018.  Review of Systems  Constitutional: Negative for fatigue and fever.  HENT: Negative for ear pain.   Eyes: Negative for pain.  Respiratory: Negative for chest tightness and shortness of breath.   Cardiovascular: Negative for chest pain, palpitations and leg swelling.  Gastrointestinal: Negative for abdominal pain.  Genitourinary: Negative for dysuria.       Objective:   Physical Exam  Constitutional: Vital signs are normal. She appears well-developed and well-nourished. She is cooperative.  Non-toxic appearance. She does not appear ill. No distress.  HENT:  Head: Normocephalic.  Right Ear: Hearing, tympanic membrane, external ear and ear canal normal. Tympanic membrane is not erythematous, not retracted and not bulging.  Left Ear: Hearing, tympanic membrane, external ear and ear canal normal. Tympanic membrane is not erythematous, not retracted and not bulging.   Nose: No mucosal edema or rhinorrhea. Right sinus exhibits no maxillary sinus tenderness and no frontal sinus tenderness. Left sinus exhibits no maxillary sinus tenderness and no frontal sinus tenderness.  Mouth/Throat: Uvula is midline, oropharynx is clear and moist and mucous membranes are normal.  Eyes: Pupils are equal, round, and reactive to light. Conjunctivae, EOM and lids are normal. Lids are everted and swept, no foreign bodies found.  Neck: Trachea normal and normal range of motion. Neck supple. Carotid bruit is not present. No thyroid mass and no thyromegaly present.  Cardiovascular: Normal rate, regular rhythm, S1 normal, S2 normal, normal heart sounds, intact distal pulses and normal pulses. Exam reveals no gallop and no friction rub.  No murmur heard. Pulmonary/Chest: Effort normal and breath sounds normal. No tachypnea. No respiratory distress. She has no decreased breath sounds. She has no wheezes. She has no rhonchi. She has no rales.  Abdominal: Soft. Normal appearance and bowel sounds are normal. There is no hepatosplenomegaly. There is tenderness in the epigastric area. There is no rigidity, no rebound, no guarding and no CVA tenderness.  Neurological: She is alert.  Skin: Skin is warm, dry and intact. No rash noted.  Psychiatric: Her speech is normal and behavior is normal. Judgment and thought content normal. Her mood appears not anxious. Cognition and memory are normal. She does not exhibit a depressed mood.          Assessment & Plan:

## 2018-02-12 NOTE — Assessment & Plan Note (Signed)
Eval with labs nad Hpylori.  Can use carafate to coat .  Start protonix in place of prilosec.

## 2018-02-12 NOTE — Patient Instructions (Addendum)
Please stop at the lab to have labs drawn.  Avoid triggers as discussed.  Stop prilosec and change to protonix 40 mg daily.  Can use carafate as needed.   If not improving in 2-4 weeks call for GI referral.  If improving complete a 4-6 week course of protonix and then wean back to prilosec 20 mg daily then off.

## 2018-02-12 NOTE — Addendum Note (Signed)
Addended by: Ellamae Sia on: 02/12/2018 10:30 AM   Modules accepted: Orders

## 2018-02-19 ENCOUNTER — Ambulatory Visit: Payer: Self-pay

## 2018-02-19 NOTE — Telephone Encounter (Signed)
Pt calling with c/o facial and teeth pain, green nasal discharge. Pt rates pain a 5/10. Pt states that she had same sx last year and was rx a Z-pack. Pt had a low grade(99.9) temp end of last week, but none since. Pt states that her nose is blocked but it comes and goes, but nose is blocked at night. Pt stated that she c/o sore throat, ear pressure. Denies difficulty breathing. Care advice given per protocol and emphasized nasal saline washes.  No appt available at PCP. Refused Monday appt. Pt did not want to see anyone at another [place] clinic.  Pt wanted note to be routed to Dr Damita Dunnings because she stated he was a family friend.   Reason for Disposition . [1] Using nasal washes and pain medicine > 24 hours AND [2] sinus pain (around cheekbone or eye) persists  Answer Assessment - Initial Assessment Questions 1. LOCATION: "Where does it hurt?"      Below eyes, eyes, teeth hurt, facial pain 2. ONSET: "When did the sinus pain start?"  (e.g., hours, days)      Last Thursday 3. SEVERITY: "How bad is the pain?"   (Scale 1-10; mild, moderate or severe)   - MILD (1-3): doesn't interfere with normal activities    - MODERATE (4-7): interferes with normal activities (e.g., work or school) or awakens from sleep   - SEVERE (8-10): excruciating pain and patient unable to do any normal activities        5/10 -moderate 4. RECURRENT SYMPTOM: "Have you ever had sinus problems before?" If so, ask: "When was the last time?" and "What happened that time?"      Yes-last time 1 year ago-z pack  5. NASAL CONGESTION: "Is the nose blocked?" If so, ask, "Can you open it or must you breathe through the mouth?"     Blocked that comes and goes but at night blocked 6. NASAL DISCHARGE: "Do you have discharge from your nose?" If so ask, "What color?"     Yes - green drainage 7. FEVER: "Do you have a fever?" If so, ask: "What is it, how was it measured, and when did it start?"      99.9 last week - none since 8. OTHER  SYMPTOMS: "Do you have any other symptoms?" (e.g., sore throat, cough, earache, difficulty breathing)     Sore throat, ear pressure, no difficulty breathing 9. PREGNANCY: "Is there any chance you are pregnant?" "When was your last menstrual period?"     No LMP: last Tuesday  Protocols used: SINUS PAIN OR CONGESTION-A-AH

## 2018-02-19 NOTE — Telephone Encounter (Signed)
Appt scheduled

## 2018-02-19 NOTE — Telephone Encounter (Signed)
Offer 3:45 tomorrow, add on OV.  Thanks.

## 2018-02-20 ENCOUNTER — Ambulatory Visit: Payer: 59 | Admitting: Family Medicine

## 2018-02-20 ENCOUNTER — Encounter: Payer: Self-pay | Admitting: Family Medicine

## 2018-02-20 DIAGNOSIS — J0100 Acute maxillary sinusitis, unspecified: Secondary | ICD-10-CM | POA: Diagnosis not present

## 2018-02-20 DIAGNOSIS — K219 Gastro-esophageal reflux disease without esophagitis: Secondary | ICD-10-CM | POA: Diagnosis not present

## 2018-02-20 DIAGNOSIS — R1013 Epigastric pain: Secondary | ICD-10-CM | POA: Diagnosis not present

## 2018-02-20 MED ORDER — DOXYCYCLINE HYCLATE 100 MG PO TABS: 100.0000 mg | ORAL_TABLET | Freq: Two times a day (BID) | ORAL | 0 refills | Status: DC

## 2018-02-20 NOTE — Patient Instructions (Signed)
Start doxy (sunburn caution), use nasal saline.  Okay to use flonase.  Take care.  Glad to see you.  Update [place] as needed.

## 2018-02-20 NOTE — Progress Notes (Signed)
duration of symptoms: 1 week  Rhinorrhea: yes, discolored congestion:yes ear pain:yes, L ear pain sore throat: yes Cough: no Myalgias: some No fevers now but had temp 99.9 initially No vomiting, no diarrhea.   Upper tooth pain on L side.   No wheeze.   She has been using nasal saline.    Per HPI unless specifically indicated in ROS section   Meds, vitals, and allergies reviewed.   GEN: nad, alert and oriented HEENT: mucous membranes moist, TM w/o erythema, nasal epithelium injected, OP with cobblestoning NECK: supple w/o LA CV: rrr. PULM: ctab, no inc wob ABD: soft, +bs EXT: no edema L max sinus ttp

## 2018-02-22 NOTE — Assessment & Plan Note (Signed)
Start doxy (sunburn caution), use nasal saline.  Okay to use flonase.  Okay for outpatient follow-up.  Discussed with patient. Update [place] as needed.

## 2018-02-23 DIAGNOSIS — K219 Gastro-esophageal reflux disease without esophagitis: Secondary | ICD-10-CM | POA: Diagnosis not present

## 2018-02-23 DIAGNOSIS — R1013 Epigastric pain: Secondary | ICD-10-CM | POA: Diagnosis not present

## 2018-02-23 NOTE — Addendum Note (Signed)
Addended by: Lendon Collar on: 02/23/2018 08:48 AM   Modules accepted: Orders

## 2018-02-24 ENCOUNTER — Encounter: Payer: Self-pay | Admitting: Family Medicine

## 2018-02-24 LAB — HELICOBACTER PYLORI  SPECIAL ANTIGEN
MICRO NUMBER:: 90519112
SPECIMEN QUALITY: ADEQUATE

## 2018-03-08 ENCOUNTER — Encounter: Payer: Self-pay | Admitting: Family Medicine

## 2018-03-08 DIAGNOSIS — R1013 Epigastric pain: Secondary | ICD-10-CM

## 2018-03-11 ENCOUNTER — Telehealth: Payer: Self-pay | Admitting: Family Medicine

## 2018-03-11 ENCOUNTER — Encounter: Payer: Self-pay | Admitting: Physician Assistant

## 2018-03-11 NOTE — Telephone Encounter (Signed)
Patient is requesting Diflucan for yeast infection obtained while taking doxycycline.  Last OV:02/20/18 TAV:WPVXYIA Pharmacy: [id number] [place], [address] [place] [employer] [phone] (Phone) [phone] (Fax)

## 2018-03-11 NOTE — Telephone Encounter (Signed)
Copied from [place] [id number]. Topic: Quick Communication - Rx Refill/Question >> Mar 11, 2018  4:58 PM Marin Olp L wrote: Medication: diflucan (yeast infection) Has the patient contacted their pharmacy? Yes.   (Agent: If no, request that the patient contact the pharmacy for the refill.) Preferred Pharmacy (with phone number or street name): [id number] [place], [address] [place] [address] Phone: [phone] Fax: [phone] Agent: Please be advised that RX refills may take up to 3 business days. We ask that you follow-up with your pharmacy.  Patient saw Dr. Damita Dunnings and was prescribed antibiotic and now has a yeast infection. Patient would like a call once it's sent.

## 2018-03-12 MED ORDER — FLUCONAZOLE 150 MG PO TABS: 150.0000 mg | ORAL_TABLET | Freq: Once | ORAL | 0 refills | Status: AC

## 2018-03-12 NOTE — Telephone Encounter (Signed)
Sent in rx for fluconazole locally.

## 2018-03-12 NOTE — Telephone Encounter (Signed)
Lysa notified that prescription has been sent to [facility].

## 2018-03-12 NOTE — Telephone Encounter (Signed)
Doxy Rx and last Rx 02/20/2018. pls advise

## 2018-03-16 ENCOUNTER — Other Ambulatory Visit: Payer: Self-pay | Admitting: Physician Assistant

## 2018-03-16 DIAGNOSIS — R0789 Other chest pain: Secondary | ICD-10-CM | POA: Diagnosis not present

## 2018-03-16 DIAGNOSIS — K219 Gastro-esophageal reflux disease without esophagitis: Secondary | ICD-10-CM | POA: Diagnosis not present

## 2018-03-16 DIAGNOSIS — R1310 Dysphagia, unspecified: Secondary | ICD-10-CM

## 2018-03-19 ENCOUNTER — Ambulatory Visit
Admission: RE | Admit: 2018-03-19 | Discharge: 2018-03-19 | Disposition: A | Discharge status: Home / Self Care | Payer: 59 | Source: Ambulatory Visit | Attending: Physician Assistant | Admitting: Physician Assistant

## 2018-03-19 DIAGNOSIS — R1310 Dysphagia, unspecified: Secondary | ICD-10-CM | POA: Diagnosis not present

## 2018-03-19 DIAGNOSIS — K219 Gastro-esophageal reflux disease without esophagitis: Secondary | ICD-10-CM | POA: Diagnosis not present

## 2018-03-24 ENCOUNTER — Other Ambulatory Visit: Payer: Self-pay | Admitting: Physician Assistant

## 2018-03-24 DIAGNOSIS — E049 Nontoxic goiter, unspecified: Secondary | ICD-10-CM

## 2018-03-24 DIAGNOSIS — R933 Abnormal findings on diagnostic imaging of other parts of digestive tract: Secondary | ICD-10-CM

## 2018-03-25 ENCOUNTER — Encounter: Payer: Self-pay | Admitting: Family Medicine

## 2018-03-27 ENCOUNTER — Other Ambulatory Visit: Payer: Self-pay | Admitting: Family Medicine

## 2018-03-27 MED ORDER — FLUCONAZOLE 150 MG PO TABS: 150.0000 mg | ORAL_TABLET | Freq: Once | ORAL | 0 refills | Status: AC

## 2018-03-27 NOTE — Telephone Encounter (Signed)
Last office visit 02/12/2018.  Not on current medication list.  Refill?

## 2018-03-30 ENCOUNTER — Ambulatory Visit
Admission: RE | Admit: 2018-03-30 | Discharge: 2018-03-30 | Disposition: A | Discharge status: Home / Self Care | Payer: 59 | Source: Ambulatory Visit | Attending: Physician Assistant | Admitting: Physician Assistant

## 2018-03-30 DIAGNOSIS — E049 Nontoxic goiter, unspecified: Secondary | ICD-10-CM

## 2018-03-30 DIAGNOSIS — E041 Nontoxic single thyroid nodule: Secondary | ICD-10-CM | POA: Diagnosis not present

## 2018-03-30 DIAGNOSIS — R933 Abnormal findings on diagnostic imaging of other parts of digestive tract: Secondary | ICD-10-CM

## 2018-04-01 ENCOUNTER — Telehealth: Payer: Self-pay

## 2018-04-01 DIAGNOSIS — E041 Nontoxic single thyroid nodule: Secondary | ICD-10-CM

## 2018-04-01 NOTE — Telephone Encounter (Signed)
Results put in Dr. Rometta Emery in box for review.

## 2018-04-01 NOTE — Telephone Encounter (Signed)
Noted, can await Dr. Sharmaine Base input.

## 2018-04-01 NOTE — Telephone Encounter (Signed)
Anna Goody PA with [name] said pt was seen for trouble swallowing and acid reflux; pt had enlarged thyroid nodule 2.2 cm. Radiologist recommends fine needle biopsy of Lt thyroid nodule. GI felt should be coordinated thru PCP. [name] will fax copy of [place] to [phone] now. Pt last seen 02/12/18 and see email note on 02/24/18. Dr Diona Browner out of office Please advise.

## 2018-04-02 ENCOUNTER — Encounter

## 2018-04-02 ENCOUNTER — Ambulatory Visit: Payer: 59 | Admitting: Physician Assistant

## 2018-04-02 NOTE — Telephone Encounter (Signed)
Leen notified as instructed by telephone.  She is agreeable with biopsy and endo referral.  She is willing to go to [facility], which ever one that can get her in the soonest.

## 2018-04-02 NOTE — Telephone Encounter (Signed)
Reviewed note.. Recommend fine needle aspiration and referral to endocrinologist. Let me know if pt agreeable and if preferences for location.

## 2018-04-03 ENCOUNTER — Telehealth: Payer: Self-pay | Admitting: Family Medicine

## 2018-04-03 ENCOUNTER — Encounter (INDEPENDENT_AMBULATORY_CARE_PROVIDER_SITE_OTHER): Payer: Self-pay

## 2018-04-03 NOTE — Telephone Encounter (Signed)
Message sent via [place]. Can use tylenol.

## 2018-04-03 NOTE — Telephone Encounter (Signed)
Scheduled the patient for her Thyroid Biopsy for 04/21/18 at [place] [facility], also got her in to see Dr Chalmers Cater for next Tuesday, 04/07/18 at 7:45am. Patient asked me to send you a note asking what you recommend she can take for the pain she is having from the thyroid nodule. It is painful when she moves her head downward or side to side etc. Talking on the phone makes it hurt and by the end of the day it is hurting. Please send her a Mychart message with your recommendations please.

## 2018-04-07 DIAGNOSIS — Z808 Family history of malignant neoplasm of other organs or systems: Secondary | ICD-10-CM | POA: Diagnosis not present

## 2018-04-07 DIAGNOSIS — E041 Nontoxic single thyroid nodule: Secondary | ICD-10-CM | POA: Diagnosis not present

## 2018-04-21 ENCOUNTER — Other Ambulatory Visit (HOSPITAL_COMMUNITY)
Admission: RE | Admit: 2018-04-21 | Discharge: 2018-04-21 | Disposition: A | Discharge status: Home / Self Care | Payer: 59 | Source: Ambulatory Visit | Attending: Student | Admitting: Student

## 2018-04-21 ENCOUNTER — Ambulatory Visit
Admission: RE | Admit: 2018-04-21 | Discharge: 2018-04-21 | Disposition: A | Discharge status: Home / Self Care | Payer: 59 | Source: Ambulatory Visit | Attending: Family Medicine | Admitting: Family Medicine

## 2018-04-21 DIAGNOSIS — E079 Disorder of thyroid, unspecified: Secondary | ICD-10-CM | POA: Diagnosis not present

## 2018-04-21 DIAGNOSIS — E041 Nontoxic single thyroid nodule: Secondary | ICD-10-CM | POA: Insufficient documentation

## 2018-04-24 DIAGNOSIS — E041 Nontoxic single thyroid nodule: Secondary | ICD-10-CM | POA: Diagnosis not present

## 2018-05-05 ENCOUNTER — Encounter (HOSPITAL_COMMUNITY): Payer: Self-pay | Admitting: Interventional Radiology

## 2018-05-12 DIAGNOSIS — N760 Acute vaginitis: Secondary | ICD-10-CM | POA: Diagnosis not present

## 2018-05-12 DIAGNOSIS — N6320 Unspecified lump in the left breast, unspecified quadrant: Secondary | ICD-10-CM | POA: Diagnosis not present

## 2018-05-12 DIAGNOSIS — C73 Malignant neoplasm of thyroid gland: Secondary | ICD-10-CM | POA: Diagnosis not present

## 2018-05-25 ENCOUNTER — Ambulatory Visit: Payer: Self-pay | Admitting: Surgery

## 2018-05-25 DIAGNOSIS — D440 Neoplasm of uncertain behavior of thyroid gland: Secondary | ICD-10-CM | POA: Diagnosis not present

## 2018-05-25 DIAGNOSIS — E039 Hypothyroidism, unspecified: Secondary | ICD-10-CM | POA: Diagnosis not present

## 2018-05-25 DIAGNOSIS — E041 Nontoxic single thyroid nodule: Secondary | ICD-10-CM | POA: Diagnosis not present

## 2018-06-25 DIAGNOSIS — M25561 Pain in right knee: Secondary | ICD-10-CM | POA: Diagnosis not present

## 2018-07-02 DIAGNOSIS — M25561 Pain in right knee: Secondary | ICD-10-CM | POA: Diagnosis not present

## 2018-07-06 NOTE — Patient Instructions (Signed)
Anna [name]  07/06/2018   Your procedure is scheduled on: Thursday 07/16/2018  Report to [facility] Main  Entrance              Report to admitting at  0800 AM    Call this number if you have problems the morning of surgery [phone]    Remember: Do not eat food or drink liquids :After Midnight.     Take these medicines the morning of surgery with A SIP OF WATER: Levothyroxine (Synthroid), Omeprazole (Prilosec), Use Albuterol inhaler if needed and bring the inhaler with you to the hospital                                 You may not have any metal on your body including hair pins and              piercings  Do not wear jewelry, make-up, lotions, powders or perfumes, deodorant             Do not wear nail polish.  Do not shave  48 hours prior to surgery.             Do not bring valuables to the hospital. Anna [name].  Contacts, dentures or bridgework may not be worn into surgery.  Leave suitcase in the car. After surgery it may be brought to your room.                  Please read over the following fact sheets you were given: _____________________________________________________________________             [facility] - Preparing for Surgery Before surgery, you can play an important role.  Because skin is not sterile, your skin needs to be as free of germs as possible.  You can reduce the number of germs on your skin by washing with CHG (chlorahexidine gluconate) soap before surgery.  CHG is an antiseptic cleaner which kills germs and bonds with the skin to continue killing germs even after washing. Please DO NOT use if you have an allergy to CHG or antibacterial soaps.  If your skin becomes reddened/irritated stop using the CHG and inform your nurse when you arrive at Short Stay. Do not shave (including legs and underarms) for at least 48 hours prior to the first CHG shower.  You may shave your  face/neck. Please follow these instructions carefully:  1.  Shower with CHG Soap the night before surgery and the  morning of Surgery.  2.  If you choose to wash your hair, wash your hair first as usual with your  normal  shampoo.  3.  After you shampoo, rinse your hair and body thoroughly to remove the  shampoo.                           4.  Use CHG as you would any other liquid soap.  You can apply chg directly  to the skin and wash                       Gently with a scrungie or clean washcloth.  5.  Apply the CHG Soap to  your body ONLY FROM THE NECK DOWN.   Do not use on face/ open                           Wound or open sores. Avoid contact with eyes, ears mouth and genitals (private parts).                       Wash face,  Genitals (private parts) with your normal soap.             6.  Wash thoroughly, paying special attention to the area where your surgery  will be performed.  7.  Thoroughly rinse your body with warm water from the neck down.  8.  DO NOT shower/wash with your normal soap after using and rinsing off  the CHG Soap.                9.  Pat yourself dry with a clean towel.            10.  Wear clean pajamas.            11.  Place clean sheets on your bed the night of your first shower and do not  sleep with pets. Day of Surgery : Do not apply any lotions/deodorants the morning of surgery.  Please wear clean clothes to the hospital/surgery center.  FAILURE TO FOLLOW THESE INSTRUCTIONS MAY RESULT IN THE CANCELLATION OF YOUR SURGERY PATIENT SIGNATURE_________________________________  NURSE SIGNATURE__________________________________  ________________________________________________________________________

## 2018-07-09 ENCOUNTER — Encounter (HOSPITAL_COMMUNITY)
Admission: RE | Admit: 2018-07-09 | Discharge: 2018-07-09 | Disposition: A | Discharge status: Home / Self Care | Payer: 59 | Source: Ambulatory Visit | Attending: Surgery | Admitting: Surgery

## 2018-07-09 ENCOUNTER — Encounter (HOSPITAL_COMMUNITY): Payer: Self-pay

## 2018-07-09 ENCOUNTER — Ambulatory Visit (HOSPITAL_COMMUNITY)
Admission: RE | Admit: 2018-07-09 | Discharge: 2018-07-09 | Disposition: A | Discharge status: Home / Self Care | Payer: 59 | Source: Ambulatory Visit | Attending: Anesthesiology | Admitting: Anesthesiology

## 2018-07-09 ENCOUNTER — Other Ambulatory Visit: Payer: Self-pay

## 2018-07-09 DIAGNOSIS — Z01818 Encounter for other preprocedural examination: Secondary | ICD-10-CM | POA: Insufficient documentation

## 2018-07-09 DIAGNOSIS — D440 Neoplasm of uncertain behavior of thyroid gland: Secondary | ICD-10-CM | POA: Diagnosis not present

## 2018-07-09 DIAGNOSIS — E041 Nontoxic single thyroid nodule: Secondary | ICD-10-CM | POA: Diagnosis not present

## 2018-07-09 DIAGNOSIS — Z01811 Encounter for preprocedural respiratory examination: Secondary | ICD-10-CM

## 2018-07-09 HISTORY — DX: Other specified postprocedural states: Z98.890

## 2018-07-09 HISTORY — DX: Nausea with vomiting, unspecified: R11.2

## 2018-07-09 LAB — BASIC METABOLIC PANEL
ANION GAP: 9 (ref 5–15)
BUN: 14 mg/dL (ref 6–20)
CO2: 28 mmol/L (ref 22–32)
CREATININE: 0.92 mg/dL (ref 0.44–1.00)
Calcium: 9.6 mg/dL (ref 8.9–10.3)
Chloride: 104 mmol/L (ref 98–111)
GFR calc non Af Amer: >60 mL/min (ref >60)
GLUCOSE: 100 mg/dL — AB (ref 70–99)
Potassium: 4.7 mmol/L (ref 3.5–5.1)
Sodium: 141 mmol/L (ref 135–145)

## 2018-07-09 LAB — CBC
HEMATOCRIT: 43.4 % (ref 36.0–46.0)
Hemoglobin: 14.2 g/dL (ref 12.0–15.0)
MCH: 29.6 pg (ref 26.0–34.0)
MCHC: 32.7 g/dL (ref 30.0–36.0)
MCV: 90.4 fL (ref 78.0–100.0)
PLATELETS: 266 10*3/uL (ref 150–400)
RBC: 4.8 MIL/uL (ref 3.87–5.11)
RDW: 13.3 % (ref 11.5–15.5)
WBC: 6.8 10*3/uL (ref 4.0–10.5)

## 2018-07-09 LAB — HCG, SERUM, QUALITATIVE: Preg, Serum: POSITIVE — AB

## 2018-07-09 NOTE — Progress Notes (Signed)
Called and spoke to [place] in Triage at [place] and informed her of results of serum pregnancy test. She will inform Dr. Harlow Asa and get back with me.

## 2018-07-10 NOTE — Progress Notes (Signed)
Clearance for surgery on chart from Dr. Katharine Look Rivard

## 2018-07-12 ENCOUNTER — Encounter (HOSPITAL_COMMUNITY): Payer: Self-pay | Admitting: Surgery

## 2018-07-12 DIAGNOSIS — E039 Hypothyroidism, unspecified: Secondary | ICD-10-CM | POA: Diagnosis present

## 2018-07-12 DIAGNOSIS — D440 Neoplasm of uncertain behavior of thyroid gland: Secondary | ICD-10-CM | POA: Diagnosis present

## 2018-07-12 NOTE — H&P (Signed)
General Surgery [facility]  Anna [name] DOB: [birth year] Married / Language: Cleophus Molt / Race: White Female   History of Present Illness  The patient is a 47 year old female who presents with a thyroid nodule.  CHIEF COMPLAINT: thyroid nodule with atypia  Patient is referred by Dr. Jacelyn Pi for surgical evaluation and management of a left thyroid nodule with cytologic atypia. Patient's primary care physician is Dr. Eliezer Lofts. Patient had a barium swallow study for evaluation of dysphagia. She was noted to have a extrinsic mass on the left side. She subsequently underwent thyroid ultrasound in early June 2019. This showed a solitary nodule in the inferior left thyroid lobe measuring 2.2 cm. It was felt to be moderately suspicious and biopsy was recommended. Patient subsequently underwent percutaneous biopsy demonstrating cytologic atypia, Bethesda category III. specimen was submitted for [facility] testing which came back as suspicious, representing a 50% risk of malignancy. Patient is now referred to surgery for resection for definitive diagnosis and management. Patient has no prior history of thyroid disease. She has never been on thyroid medication. She was found on laboratory studies to be mildly hypothyroid and was started on levothyroxine 50 g daily by her endocrinologist. Patient has had no prior head or neck surgery. There is a family history of thyroid cancer in the patient's mother. There is no other family history of endocrine neoplasms. Patient presents today accompanied by her husband to discuss thyroid surgery.   Past Surgical History No pertinent past surgical history   Diagnostic Studies History  Colonoscopy  1-5 years ago Mammogram  within last year  Allergies Sulfa Antibiotics  Penicillins   Medication History Levothyroxine Sodium (50MCG Tablet, Oral) Active. Omeprazole (40MG  Capsule DR, Oral) Active. Medications  Reconciled  Social History Alcohol use  Occasional alcohol use. Caffeine use  Coffee. No drug use  Tobacco use  Former smoker.  Family History Alcohol Abuse  Brother, Father, Mother. Migraine Headache  Mother. Thyroid problems  Mother.  Pregnancy / Birth History Age at menarche  39 years. Contraceptive History  Oral contraceptives. Gravida  2 Length (months) of breastfeeding  7-12 Maternal age  31-30 Para  2 Regular periods   Other Problems Asthma  Diverticulosis  Gastroesophageal Reflux Disease  Migraine Headache  Thyroid Cancer   Review of Systems General Not Present- Appetite Loss, Chills, Fatigue, Fever, Night Sweats, Weight Gain and Weight Loss. Skin Not Present- Change in Wart/Mole, Dryness, Hives, Jaundice, New Lesions, Non-Healing Wounds, Rash and Ulcer. HEENT Present- Seasonal Allergies and Wears glasses/contact lenses. Not Present- Earache, Hearing Loss, Hoarseness, Nose Bleed, Oral Ulcers, Ringing in the Ears, Sinus Pain, Sore Throat, Visual Disturbances and Yellow Eyes. Respiratory Not Present- Bloody sputum, Chronic Cough, Difficulty Breathing, Snoring and Wheezing. Breast Not Present- Breast Mass, Breast Pain, Nipple Discharge and Skin Changes. Cardiovascular Not Present- Chest Pain, Difficulty Breathing Lying Down, Leg Cramps, Palpitations, Rapid Heart Rate, Shortness of Breath and Swelling of Extremities. Gastrointestinal Present- Indigestion. Not Present- Abdominal Pain, Bloating, Bloody Stool, Change in Bowel Habits, Chronic diarrhea, Constipation, Difficulty Swallowing, Excessive gas, Gets full quickly at meals, Hemorrhoids, Nausea, Rectal Pain and Vomiting. Female Genitourinary Not Present- Frequency, Nocturia, Painful Urination, Pelvic Pain and Urgency. Musculoskeletal Not Present- Back Pain, Joint Pain, Joint Stiffness, Muscle Pain, Muscle Weakness and Swelling of Extremities. Neurological Not Present- Decreased Memory, Fainting,  Headaches, Numbness, Seizures, Tingling, Tremor, Trouble walking and Weakness. Psychiatric Not Present- Anxiety, Bipolar, Change in Sleep Pattern, Depression, Fearful and Frequent crying. Endocrine Present- Cold  Intolerance. Not Present- Excessive Hunger, Hair Changes, Heat Intolerance, Hot flashes and New Diabetes. Hematology Not Present- Blood Thinners, Easy Bruising, Excessive bleeding, Gland problems, HIV and Persistent Infections.  Vitals  Weight: 161.8 lb Height: 67in Body Surface Area: 1.85 m Body Mass Index: 25.34 kg/m  Temp.: 98.34F  Pulse: 77 (Regular)  BP: 114/64 (Sitting, Left Arm, Standard)  Physical Exam  See vital signs recorded above  GENERAL APPEARANCE Development: normal Nutritional status: normal Gross deformities: none  SKIN Rash, lesions, ulcers: none Induration, erythema: none Nodules: none palpable  EYES Conjunctiva and lids: normal Pupils: equal and reactive Iris: normal bilaterally  EARS, NOSE, MOUTH, THROAT External ears: no lesion or deformity External nose: no lesion or deformity Hearing: grossly normal Lips: no lesion or deformity Dentition: normal for age Oral mucosa: moist  NECK Symmetric: no Trachea: midline Thyroid: Right thyroid lobe without palpable abnormality. Left thyroid lobe with an approximately 2.5 cm nodule in the mid left lobe, smooth, nontender, mobile with swallowing.  CHEST Respiratory effort: normal Retraction or accessory muscle use: no Breath sounds: normal bilaterally Rales, rhonchi, wheeze: none  CARDIOVASCULAR Auscultation: regular rhythm, normal rate Murmurs: none Pulses: carotid and radial pulse 2+ palpable Lower extremity edema: none Lower extremity varicosities: none  MUSCULOSKELETAL Station and gait: normal Digits and nails: no clubbing or cyanosis Muscle strength: grossly normal all extremities Range of motion: grossly normal all extremities Deformity: none  LYMPHATIC Cervical:  none palpable Supraclavicular: none palpable  PSYCHIATRIC Oriented to person, place, and time: yes Mood and affect: normal for situation Judgment and insight: appropriate for situation    Assessment & Plan  NEOPLASM OF UNCERTAIN BEHAVIOR OF THYROID GLAND (D44.0)  Pt Education - Pamphlet Given - The Thyroid Book: discussed with patient and provided information.  HYPOTHYROIDISM, ADULT (E03.9)  Patient is referred by her endocrinologist, Dr. Chalmers Cater, for surgical evaluation and management of a thyroid neoplasm of uncertain behavior. She is provided with written literature on thyroid surgery to review at home. She is accompanied today by her husband.  Patient has a 2.2 cm nodule in the inferior pole of the left thyroid lobe. Right lobe appears normal. I have recommended proceeding with left thyroid lobectomy for definitive diagnosis. There is a 50% chance of malignancy but there is also a 50% chance that this is benign. We discussed the potential need for additional surgery in the event of certain types of thyroid malignancy. We discussed the risk and benefits of the procedure including the risk of injury to recurrent laryngeal nerve and parathyroid glands. We discussed the location of the surgical incision. We discussed the hospital stay to be anticipated. We discussed her postoperative recovery and return to work and activities. We discussed the likely need for lifelong thyroid hormone replacement. We discussed the potential need for radioactive iodine treatment. Patient and her husband understand and wish to proceed with surgery in the near future.  The risks and benefits of the procedure have been discussed at length with the patient. The patient understands the proposed procedure, potential alternative treatments, and the course of recovery to be expected. All of the patient's questions have been answered at this time. The patient wishes to proceed with surgery.   Armandina Gemma,  [facility] Office: [phone]

## 2018-07-15 NOTE — Anesthesia Preprocedure Evaluation (Addendum)
Anesthesia Evaluation  Patient identified by MRN, date of birth, ID band Patient awake    Reviewed: Allergy & Precautions, NPO status , Patient's Chart, lab work & pertinent test results  History of Anesthesia Complications (+) PONV  Airway Mallampati: II  TM Distance: >3 FB Neck ROM: Full    Dental no notable dental hx. (+) Teeth Intact, Dental Advisory Given   Pulmonary asthma , former smoker,    Pulmonary exam normal breath sounds clear to auscultation       Cardiovascular Exercise Tolerance: Good negative cardio ROS Normal cardiovascular exam Rhythm:Regular Rate:Normal     Neuro/Psych  Headaches, negative psych ROS   GI/Hepatic GERD  ,  Endo/Other  Hypothyroidism   Renal/GU      Musculoskeletal   Abdominal   Peds  Hematology   Anesthesia Other Findings   Reproductive/Obstetrics negative OB ROS                             Anesthesia Physical Anesthesia Plan  ASA: II  Anesthesia Plan: General   Post-op Pain Management:    Induction: Intravenous  PONV Risk Score and Plan: 4 or greater and Treatment may vary due to age or medical condition, Ondansetron, Dexamethasone and Scopolamine patch - Pre-op  Airway Management Planned: Oral ETT  Additional Equipment:   Intra-op Plan:   Post-operative Plan: Extubation in OR  Informed Consent: I have reviewed the patients History and Physical, chart, labs and discussed the procedure including the risks, benefits and alternatives for the proposed anesthesia with the patient or authorized representative who has indicated his/her understanding and acceptance.   Dental advisory given  Plan Discussed with: CRNA  Anesthesia Plan Comments:        Anesthesia Quick Evaluation

## 2018-07-16 ENCOUNTER — Ambulatory Visit (HOSPITAL_COMMUNITY): Payer: 59 | Admitting: Anesthesiology

## 2018-07-16 ENCOUNTER — Encounter (HOSPITAL_COMMUNITY)
Admission: RE | Disposition: A | Discharge status: Home / Self Care | Payer: Self-pay | Source: Ambulatory Visit | Attending: Surgery

## 2018-07-16 ENCOUNTER — Encounter (HOSPITAL_COMMUNITY): Payer: Self-pay | Admitting: Certified Registered"

## 2018-07-16 ENCOUNTER — Observation Stay (HOSPITAL_COMMUNITY)
Admission: RE | Admit: 2018-07-16 | Discharge: 2018-07-17 | Disposition: A | Discharge status: Home / Self Care | Payer: 59 | Source: Ambulatory Visit | Attending: Surgery | Admitting: Surgery

## 2018-07-16 ENCOUNTER — Other Ambulatory Visit: Payer: Self-pay

## 2018-07-16 DIAGNOSIS — D34 Benign neoplasm of thyroid gland: Secondary | ICD-10-CM | POA: Diagnosis not present

## 2018-07-16 DIAGNOSIS — E039 Hypothyroidism, unspecified: Secondary | ICD-10-CM | POA: Diagnosis present

## 2018-07-16 DIAGNOSIS — Z79899 Other long term (current) drug therapy: Secondary | ICD-10-CM | POA: Insufficient documentation

## 2018-07-16 DIAGNOSIS — Z87891 Personal history of nicotine dependence: Secondary | ICD-10-CM | POA: Insufficient documentation

## 2018-07-16 DIAGNOSIS — J309 Allergic rhinitis, unspecified: Secondary | ICD-10-CM | POA: Diagnosis not present

## 2018-07-16 DIAGNOSIS — Z808 Family history of malignant neoplasm of other organs or systems: Secondary | ICD-10-CM | POA: Diagnosis not present

## 2018-07-16 DIAGNOSIS — E041 Nontoxic single thyroid nodule: Secondary | ICD-10-CM | POA: Diagnosis present

## 2018-07-16 DIAGNOSIS — D440 Neoplasm of uncertain behavior of thyroid gland: Secondary | ICD-10-CM | POA: Diagnosis not present

## 2018-07-16 DIAGNOSIS — Z882 Allergy status to sulfonamides status: Secondary | ICD-10-CM | POA: Insufficient documentation

## 2018-07-16 DIAGNOSIS — K219 Gastro-esophageal reflux disease without esophagitis: Secondary | ICD-10-CM | POA: Insufficient documentation

## 2018-07-16 DIAGNOSIS — J45909 Unspecified asthma, uncomplicated: Secondary | ICD-10-CM | POA: Insufficient documentation

## 2018-07-16 DIAGNOSIS — Z880 Allergy status to penicillin: Secondary | ICD-10-CM | POA: Insufficient documentation

## 2018-07-16 DIAGNOSIS — E038 Other specified hypothyroidism: Secondary | ICD-10-CM | POA: Diagnosis not present

## 2018-07-16 DIAGNOSIS — Z79890 Hormone replacement therapy (postmenopausal): Secondary | ICD-10-CM | POA: Insufficient documentation

## 2018-07-16 HISTORY — PX: THYROID LOBECTOMY: SHX420

## 2018-07-16 SURGERY — LOBECTOMY, THYROID
Anesthesia: General | Laterality: Left

## 2018-07-16 MED ORDER — BUPIVACAINE HCL (PF) 0.25 % IJ SOLN
INTRAMUSCULAR | Status: AC
  Filled 2018-07-16: qty 30

## 2018-07-16 MED ORDER — ROCURONIUM BROMIDE 10 MG/ML (PF) SYRINGE
PREFILLED_SYRINGE | INTRAVENOUS | Status: DC | PRN
  Administered 2018-07-16: 20 mg via INTRAVENOUS
  Administered 2018-07-16: 10 mg via INTRAVENOUS
  Administered 2018-07-16: 20 mg via INTRAVENOUS

## 2018-07-16 MED ORDER — HYDROMORPHONE HCL 1 MG/ML IJ SOLN
1.0000 mg | INTRAMUSCULAR | Status: DC | PRN
  Administered 2018-07-16 – 2018-07-17 (×2): 1 mg via INTRAVENOUS
  Filled 2018-07-16 (×2): qty 1

## 2018-07-16 MED ORDER — MIDAZOLAM HCL 2 MG/2ML IJ SOLN
INTRAMUSCULAR | Status: AC
  Filled 2018-07-16: qty 2

## 2018-07-16 MED ORDER — PROMETHAZINE HCL 25 MG/ML IJ SOLN
INTRAMUSCULAR | Status: AC
  Filled 2018-07-16: qty 1

## 2018-07-16 MED ORDER — ACETAMINOPHEN 10 MG/ML IV SOLN
INTRAVENOUS | Status: AC
  Filled 2018-07-16: qty 100

## 2018-07-16 MED ORDER — ALBUTEROL SULFATE (2.5 MG/3ML) 0.083% IN NEBU: 2.5000 mg | INHALATION_SOLUTION | Freq: Four times a day (QID) | RESPIRATORY_TRACT | Status: DC | PRN

## 2018-07-16 MED ORDER — HYDROCODONE-ACETAMINOPHEN 5-325 MG PO TABS
1.0000 | ORAL_TABLET | ORAL | Status: DC | PRN
  Administered 2018-07-16: 1 via ORAL
  Administered 2018-07-17: 2 via ORAL
  Filled 2018-07-16: qty 1
  Filled 2018-07-16: qty 2

## 2018-07-16 MED ORDER — SCOPOLAMINE 1 MG/3DAYS TD PT72
1.0000 | MEDICATED_PATCH | TRANSDERMAL | Status: DC
  Administered 2018-07-16: 1.5 mg via TRANSDERMAL
  Filled 2018-07-16: qty 1

## 2018-07-16 MED ORDER — LACTATED RINGERS IV SOLN
INTRAVENOUS | Status: DC
  Administered 2018-07-16: 1000 mL via INTRAVENOUS

## 2018-07-16 MED ORDER — ACETAMINOPHEN 500 MG PO TABS
1000.0000 mg | ORAL_TABLET | Freq: Once | ORAL | Status: AC
  Administered 2018-07-16: 1000 mg via ORAL
  Filled 2018-07-16: qty 2

## 2018-07-16 MED ORDER — PHENYLEPHRINE 40 MCG/ML (10ML) SYRINGE FOR IV PUSH (FOR BLOOD PRESSURE SUPPORT)
PREFILLED_SYRINGE | INTRAVENOUS | Status: DC | PRN
  Administered 2018-07-16: 80 ug via INTRAVENOUS

## 2018-07-16 MED ORDER — ONDANSETRON HCL 4 MG/2ML IJ SOLN
INTRAMUSCULAR | Status: DC | PRN
  Administered 2018-07-16: 4 mg via INTRAVENOUS

## 2018-07-16 MED ORDER — LIP MEDEX EX OINT
TOPICAL_OINTMENT | CUTANEOUS | Status: AC
  Filled 2018-07-16: qty 7

## 2018-07-16 MED ORDER — CHLORHEXIDINE GLUCONATE CLOTH 2 % EX PADS: 6.0000 | MEDICATED_PAD | Freq: Once | CUTANEOUS | Status: DC

## 2018-07-16 MED ORDER — MIDAZOLAM HCL 5 MG/5ML IJ SOLN
INTRAMUSCULAR | Status: DC | PRN
  Administered 2018-07-16: 2 mg via INTRAVENOUS

## 2018-07-16 MED ORDER — HEMOSTATIC AGENTS (NO CHARGE) OPTIME
TOPICAL | Status: DC | PRN
  Administered 2018-07-16: 1 via TOPICAL

## 2018-07-16 MED ORDER — PROPOFOL 10 MG/ML IV BOLUS
INTRAVENOUS | Status: DC | PRN
  Administered 2018-07-16: 150 mg via INTRAVENOUS

## 2018-07-16 MED ORDER — MEPERIDINE HCL 50 MG/ML IJ SOLN: 6.2500 mg | INTRAMUSCULAR | Status: DC | PRN

## 2018-07-16 MED ORDER — SUGAMMADEX SODIUM 200 MG/2ML IV SOLN
INTRAVENOUS | Status: DC | PRN
  Administered 2018-07-16: 150 mg via INTRAVENOUS

## 2018-07-16 MED ORDER — LIDOCAINE 2% (20 MG/ML) 5 ML SYRINGE
INTRAMUSCULAR | Status: AC
  Filled 2018-07-16: qty 5

## 2018-07-16 MED ORDER — ACETAMINOPHEN 325 MG PO TABS
650.0000 mg | ORAL_TABLET | Freq: Four times a day (QID) | ORAL | Status: DC | PRN
  Administered 2018-07-16 – 2018-07-17 (×2): 650 mg via ORAL
  Filled 2018-07-16 (×2): qty 2

## 2018-07-16 MED ORDER — DEXAMETHASONE SODIUM PHOSPHATE 10 MG/ML IJ SOLN
INTRAMUSCULAR | Status: DC | PRN
  Administered 2018-07-16: 10 mg via INTRAVENOUS

## 2018-07-16 MED ORDER — PROMETHAZINE HCL 25 MG/ML IJ SOLN
6.2500 mg | INTRAMUSCULAR | Status: DC | PRN
  Administered 2018-07-16: 12.5 mg via INTRAVENOUS

## 2018-07-16 MED ORDER — KCL IN DEXTROSE-NACL 20-5-0.45 MEQ/L-%-% IV SOLN
INTRAVENOUS | Status: DC
  Administered 2018-07-16: 13:00:00 via INTRAVENOUS
  Filled 2018-07-16: qty 1000

## 2018-07-16 MED ORDER — LIDOCAINE 2% (20 MG/ML) 5 ML SYRINGE
INTRAMUSCULAR | Status: DC | PRN
  Administered 2018-07-16: 60 mg via INTRAVENOUS

## 2018-07-16 MED ORDER — HYDROMORPHONE HCL 1 MG/ML IJ SOLN
INTRAMUSCULAR | Status: AC
  Filled 2018-07-16: qty 2

## 2018-07-16 MED ORDER — ACETAMINOPHEN 650 MG RE SUPP: 650.0000 mg | Freq: Four times a day (QID) | RECTAL | Status: DC | PRN

## 2018-07-16 MED ORDER — FENTANYL CITRATE (PF) 250 MCG/5ML IJ SOLN
INTRAMUSCULAR | Status: AC
  Filled 2018-07-16: qty 5

## 2018-07-16 MED ORDER — 0.9 % SODIUM CHLORIDE (POUR BTL) OPTIME
TOPICAL | Status: DC | PRN
  Administered 2018-07-16: 1000 mL

## 2018-07-16 MED ORDER — SUCCINYLCHOLINE CHLORIDE 200 MG/10ML IV SOSY
PREFILLED_SYRINGE | INTRAVENOUS | Status: DC | PRN
  Administered 2018-07-16: 100 mg via INTRAVENOUS

## 2018-07-16 MED ORDER — ROCURONIUM BROMIDE 10 MG/ML (PF) SYRINGE
PREFILLED_SYRINGE | INTRAVENOUS | Status: AC
  Filled 2018-07-16: qty 10

## 2018-07-16 MED ORDER — ACETAMINOPHEN 10 MG/ML IV SOLN: 1000.0000 mg | Freq: Once | INTRAVENOUS | Status: DC | PRN

## 2018-07-16 MED ORDER — HYDROCODONE-ACETAMINOPHEN 7.5-325 MG PO TABS: 1.0000 | ORAL_TABLET | Freq: Once | ORAL | Status: DC | PRN

## 2018-07-16 MED ORDER — FENTANYL CITRATE (PF) 100 MCG/2ML IJ SOLN
INTRAMUSCULAR | Status: DC | PRN
  Administered 2018-07-16: 50 ug via INTRAVENOUS
  Administered 2018-07-16: 100 ug via INTRAVENOUS

## 2018-07-16 MED ORDER — HYDROMORPHONE HCL 1 MG/ML IJ SOLN
0.2500 mg | INTRAMUSCULAR | Status: DC | PRN
  Administered 2018-07-16 (×4): 0.5 mg via INTRAVENOUS

## 2018-07-16 MED ORDER — ONDANSETRON HCL 4 MG/2ML IJ SOLN
4.0000 mg | Freq: Four times a day (QID) | INTRAMUSCULAR | Status: DC | PRN
  Administered 2018-07-16 (×2): 4 mg via INTRAVENOUS
  Filled 2018-07-16 (×2): qty 2

## 2018-07-16 MED ORDER — SUCCINYLCHOLINE CHLORIDE 200 MG/10ML IV SOSY
PREFILLED_SYRINGE | INTRAVENOUS | Status: AC
  Filled 2018-07-16: qty 10

## 2018-07-16 MED ORDER — CIPROFLOXACIN IN D5W 400 MG/200ML IV SOLN
400.0000 mg | INTRAVENOUS | Status: AC
  Administered 2018-07-16: 400 mg via INTRAVENOUS
  Filled 2018-07-16: qty 200

## 2018-07-16 MED ORDER — TRAMADOL HCL 50 MG PO TABS
50.0000 mg | ORAL_TABLET | Freq: Four times a day (QID) | ORAL | Status: DC | PRN
  Administered 2018-07-16: 50 mg via ORAL
  Filled 2018-07-16: qty 1

## 2018-07-16 MED ORDER — ONDANSETRON 4 MG PO TBDP: 4.0000 mg | ORAL_TABLET | Freq: Four times a day (QID) | ORAL | Status: DC | PRN

## 2018-07-16 MED ORDER — PROPOFOL 10 MG/ML IV BOLUS
INTRAVENOUS | Status: AC
  Filled 2018-07-16: qty 20

## 2018-07-16 MED ORDER — PHENYLEPHRINE 40 MCG/ML (10ML) SYRINGE FOR IV PUSH (FOR BLOOD PRESSURE SUPPORT)
PREFILLED_SYRINGE | INTRAVENOUS | Status: AC
  Filled 2018-07-16: qty 10

## 2018-07-16 SURGICAL SUPPLY — 34 items
ATTRACTOMAT 16X20 MAGNETIC DRP (DRAPES) ×3 IMPLANT
BLADE SURG 15 STRL LF DISP TIS (BLADE) ×1 IMPLANT
BLADE SURG 15 STRL SS (BLADE) ×2
CHLORAPREP W/TINT 26ML (MISCELLANEOUS) ×3 IMPLANT
CLIP VESOCCLUDE MED 6/CT (CLIP) ×6 IMPLANT
CLIP VESOCCLUDE SM WIDE 6/CT (CLIP) ×9 IMPLANT
CLOSURE WOUND 1/2 X4 (GAUZE/BANDAGES/DRESSINGS) ×1
COVER SURGICAL LIGHT HANDLE (MISCELLANEOUS) ×3 IMPLANT
DISSECTOR ROUND CHERRY 3/8 STR (MISCELLANEOUS) IMPLANT
DRAPE LAPAROTOMY T 98X78 PEDS (DRAPES) ×3 IMPLANT
ELECT PENCIL ROCKER SW 15FT (MISCELLANEOUS) ×3 IMPLANT
ELECT REM PT RETURN 15FT ADLT (MISCELLANEOUS) ×3 IMPLANT
GAUZE 4X4 16PLY RFD (DISPOSABLE) ×3 IMPLANT
GAUZE SPONGE 4X4 12PLY STRL (GAUZE/BANDAGES/DRESSINGS) IMPLANT
GLOVE SURG ORTHO 8.0 STRL STRW (GLOVE) ×3 IMPLANT
GOWN STRL REUS W/TWL XL LVL3 (GOWN DISPOSABLE) ×3 IMPLANT
HEMOSTAT SURGICEL 2X4 FIBR (HEMOSTASIS) ×3 IMPLANT
ILLUMINATOR WAVEGUIDE N/F (MISCELLANEOUS) ×3 IMPLANT
KIT BASIN OR (CUSTOM PROCEDURE TRAY) ×3 IMPLANT
LIGHT WAVEGUIDE WIDE FLAT (MISCELLANEOUS) IMPLANT
PACK BASIC VI WITH GOWN DISP (CUSTOM PROCEDURE TRAY) ×3 IMPLANT
POWDER SURGICEL 3.0 GRAM (HEMOSTASIS) IMPLANT
SHEARS HARMONIC 9CM CVD (BLADE) ×3 IMPLANT
STRIP CLOSURE SKIN 1/2X4 (GAUZE/BANDAGES/DRESSINGS) ×2 IMPLANT
SUT MNCRL AB 4-0 PS2 18 (SUTURE) ×3 IMPLANT
SUT SILK 2 0 (SUTURE)
SUT SILK 2-0 18XBRD TIE 12 (SUTURE) IMPLANT
SUT SILK 3 0 (SUTURE)
SUT SILK 3-0 18XBRD TIE 12 (SUTURE) IMPLANT
SUT VIC AB 3-0 SH 18 (SUTURE) ×6 IMPLANT
SYR BULB IRRIGATION 50ML (SYRINGE) ×3 IMPLANT
TOWEL OR 17X26 10 PK STRL BLUE (TOWEL DISPOSABLE) ×3 IMPLANT
TOWEL OR NON WOVEN STRL DISP B (DISPOSABLE) ×3 IMPLANT
YANKAUER SUCT BULB TIP 10FT TU (MISCELLANEOUS) ×3 IMPLANT

## 2018-07-16 NOTE — Op Note (Signed)
Procedure Note  Pre-operative Diagnosis:  Thyroid neoplasm of uncertain behavior  Post-operative Diagnosis:  same  Surgeon:  Armandina Gemma, MD  Assistant:  none   Procedure:  Left thyroid lobectomy and isthmusectomy  Anesthesia:  General  Estimated Blood Loss:  minimal  Drains: none         Specimen: thyroid lobe to pathology  Indications:  Patient is referred by Dr. Jacelyn Pi for surgical evaluation and management of a left thyroid nodule with cytologic atypia. Patient's primary care physician is Dr. Eliezer Lofts. Patient had a barium swallow study for evaluation of dysphagia. She was noted to have a extrinsic mass on the left side. She subsequently underwent thyroid ultrasound in early June 2019. This showed a solitary nodule in the inferior left thyroid lobe measuring 2.2 cm. It was felt to be moderately suspicious and biopsy was recommended. Patient subsequently underwent percutaneous biopsy demonstrating cytologic atypia, Bethesda category III. specimen was submitted for [facility] testing which came back as suspicious, representing a 50% risk of malignancy. Patient is now referred to surgery for resection for definitive diagnosis and management.   Procedure Details: Procedure was done in OR #4 at the [facility].  The patient was brought to the operating room and placed in a supine position on the operating room table.  Following administration of general anesthesia, the patient was positioned and then prepped and draped in the usual aseptic fashion.  After ascertaining that an adequate level of anesthesia had been achieved, a Kocher incision was made with #15 blade.  Dissection was carried through subcutaneous tissues and platysma. Hemostasis was achieved with the electrocautery.  Skin flaps were elevated cephalad and caudad from the thyroid notch to the sternal notch.  A self-retaining retractor was placed for exposure.  Strap muscles were incised in the midline and  dissection was begun on the left side.  Strap muscles were reflected laterally.  The left thyroid lobe was normal in size with a dominant firm nodule located centrally.  The lobe was gently mobilized with blunt dissection.  Superior pole vessels were dissected out and divided individually between small and medium Ligaclips with the Harmonic scalpel.  The thyroid lobe was rolled anteriorly.  Branches of the inferior thyroid artery were divided between small Ligaclips with the Harmonic scalpel.  Inferior venous tributaries were divided between Ligaclips.  Both the superior and inferior parathyroid glands were identified and preserved on their vascular pedicles.  The recurrent laryngeal nerve was identified and preserved along its course.  The ligament of Gwenlyn Found was released with the electrocautery and the gland was mobilized onto the anterior trachea. Isthmus was mobilized across the midline.  There was a small pyramidal lobe present which was resected with the thyroid isthmus.  The thyroid parenchyma was transected at the junction of the isthmus and contralateral thyroid lobe with the Harmonic scalpel.  The thyroid lobe and isthmus were submitted to pathology for review.  The neck was irrigated with warm saline.  Fibrillar was placed throughout the operative field.  Strap muscles were reapproximated in the midline with interrupted 3-0 Vicryl sutures.  Platysma was closed with interrupted 3-0 Vicryl sutures.  Skin was closed with a running 4-0 Monocryl subcuticular suture.  Wound was washed and dried and steri-strips were applied.  Dry gauze dressing was placed.  The patient was awakened from anesthesia and brought to the recovery room.  The patient tolerated the procedure well.   Armandina Gemma, MD [facility] Office: [phone]

## 2018-07-16 NOTE — Anesthesia Postprocedure Evaluation (Signed)
Anesthesia Post Note  Patient: Anna [name]  Procedure(s) Performed: LEFT  THYROID LOBECTOMY (Left )     Patient location during evaluation: PACU Anesthesia Type: General Level of consciousness: awake and alert Pain management: pain level controlled Vital Signs Assessment: post-procedure vital signs reviewed and stable Respiratory status: spontaneous breathing, nonlabored ventilation, respiratory function stable and patient connected to nasal cannula oxygen Cardiovascular status: blood pressure returned to baseline and stable Postop Assessment: no apparent nausea or vomiting Anesthetic complications: no    Last Vitals:  Vitals:   07/16/18 1115 07/16/18 1130  BP: 97/66 104/61  Pulse: (!) 58 (!) 55  Resp: 14 14  Temp:    SpO2: 99% 99%    Last Pain:  Vitals:   07/16/18 1130  TempSrc:   PainSc: [place] A Rebeckah Masih

## 2018-07-16 NOTE — Anesthesia Procedure Notes (Signed)
Procedure Name: Intubation Date/Time: 07/16/2018 9:49 AM Performed by: Princeston Blizzard, Clinical cytogeneticist D, CRNA Pre-anesthesia Checklist: Patient identified, Emergency Drugs available, Suction available, Patient being monitored and Timeout performed Patient Re-evaluated:Patient Re-evaluated prior to induction Oxygen Delivery Method: Circle system utilized Preoxygenation: Pre-oxygenation with 100% oxygen Induction Type: IV induction, Rapid sequence and Cricoid Pressure applied Laryngoscope Size: Mac and 3 Grade View: Grade I Tube type: Oral Tube size: 7.0 mm Number of attempts: 1 Airway Equipment and Method: Stylet Placement Confirmation: ETT inserted through vocal cords under direct vision,  positive ETCO2 and breath sounds checked- equal and bilateral Secured at: 23 cm Tube secured with: Tape Dental Injury: Teeth and Oropharynx as per pre-operative assessment  Comments: Intubated by Viann Fish, SRNA

## 2018-07-16 NOTE — Transfer of Care (Signed)
Immediate Anesthesia Transfer of Care Note  Patient: Anna [name]  Procedure(s) Performed: LEFT  THYROID LOBECTOMY (Left )  Patient Location: PACU  Anesthesia Type:General  Level of Consciousness: awake, alert  and oriented  Airway & Oxygen Therapy: Patient Spontanous Breathing and Patient connected to face mask oxygen  Post-op Assessment: Report given to RN and Post -op Vital signs reviewed and stable  Post vital signs: Reviewed and stable  Last Vitals:  Vitals Value Taken Time  BP 108/73 07/16/2018 11:06 AM  Temp    Pulse 66 07/16/2018 11:08 AM  Resp 15 07/16/2018 11:08 AM  SpO2 100 % 07/16/2018 11:08 AM  Vitals shown include unvalidated device data.  Last Pain:  Vitals:   07/16/18 0924  TempSrc:   PainSc: 0-No pain      Patients Stated Pain Goal: 4 (29/92/42 [id number])  Complications: No apparent anesthesia complications

## 2018-07-16 NOTE — Interval H&P Note (Signed)
History and Physical Interval Note:  07/16/2018 9:21 AM  Anna [name]  has presented today for surgery, with the diagnosis of THYROID NEOPLASM UNCERTAIN BEHAVIOR.  The various methods of treatment have been discussed with the patient and family. After consideration of risks, benefits and other options for treatment, the patient has consented to    Procedure(s): LEFT  THYROID LOBECTOMY (Left) as a surgical intervention .    The patient's history has been reviewed, patient examined, no change in status, stable for surgery.  I have reviewed the patient's chart and labs.  Questions were answered to the patient's satisfaction.    Armandina Gemma, [facility] Office: [place]

## 2018-07-17 ENCOUNTER — Encounter: Payer: Self-pay | Admitting: Family Medicine

## 2018-07-17 ENCOUNTER — Encounter (HOSPITAL_COMMUNITY): Payer: Self-pay | Admitting: Surgery

## 2018-07-17 DIAGNOSIS — D34 Benign neoplasm of thyroid gland: Secondary | ICD-10-CM | POA: Diagnosis not present

## 2018-07-17 MED ORDER — HYDROCODONE-ACETAMINOPHEN 5-325 MG PO TABS: 1.0000 | ORAL_TABLET | ORAL | 0 refills | Status: DC | PRN

## 2018-07-17 NOTE — Discharge Summary (Signed)
Physician Discharge Summary [facility]  Patient ID: Anna [name] MRN: [id number] DOB/AGE: [birth year] 47 y.o.  Admit date: 07/16/2018 Discharge date: 07/17/2018  Admission Diagnoses:  Thyroid neoplasm of uncertain behavior  Discharge Diagnoses:  Principal Problem:   Neoplasm of uncertain behavior of thyroid gland Active Problems:   Hypothyroidism   Discharged Condition: good  Hospital Course: Patient was admitted for observation following thyroid surgery.  Post op course was uncomplicated.  Pain was well controlled.  Tolerated diet.  Patient was prepared for discharge home on POD#1.  Consults: None  Treatments: surgery: left thyroid lobectomy  Discharge Exam: Blood pressure 102/69, pulse 71, temperature (!) 97.4 F (36.3 C), temperature source Oral, resp. rate 17, height 5\' 8"  (1.727 m), weight 72.6 kg, last menstrual period 06/30/2018, SpO2 99 %. HEENT - clear Neck - wound dry and intact; mild STS; voice normal Chest - clear bilaterally Cor - RRR  Disposition: Home  Discharge Instructions    Diet - low sodium heart healthy   Complete by:  As directed    Discharge instructions   Complete by:  As directed    [facility]  THYROID & PARATHYROID SURGERY:  POST-OP INSTRUCTIONS  Always review your discharge instruction sheet from the facility where your surgery was performed.  A prescription for pain medication may be given to you upon discharge.  Take your pain medication as prescribed.  If narcotic pain medicine is not needed, then you may take acetaminophen (Tylenol) or ibuprofen (Advil) as needed.  Take your usually prescribed medications unless otherwise directed.  If you need a refill on your pain medication, please contact our office during regular business hours.  Prescriptions cannot be processed by our office after 5 pm or on weekends.  Start with a light diet upon arrival home, such as soup and crackers or toast.  Be  sure to drink plenty of fluids daily.  Resume your normal diet the day after surgery.  Most patients will experience some swelling and bruising on the chest and neck area.  Ice packs will help.  Swelling and bruising can take several days to resolve.   It is common to experience some constipation after surgery.  Increasing fluid intake and taking a stool softener (Colace) will usually help or prevent this problem.  A mild laxative (Milk of Magnesia or Miralax) should be taken according to package directions if there has been no bowel movement after 48 hours.  You have steri-strips and a gauze dressing over your incision.  You may remove the gauze bandage on the second day after surgery, and you may shower at that time.  Leave your steri-strips (small skin tapes) in place directly over the incision.  These strips should remain on the skin for 5-7 days and then be removed.  You may get them wet in the shower and pat them dry.  You may resume regular (light) daily activities beginning the next day (such as daily self-care, walking, climbing stairs) gradually increasing activities as tolerated.  You may have sexual intercourse when it is comfortable.  Refrain from any heavy lifting or straining until approved by your doctor.  You may drive when you no longer are taking prescription pain medication, you can comfortably wear a seatbelt, and you can safely maneuver your car and apply brakes.  You should see your doctor in the office for a follow-up appointment approximately three weeks after your surgery.  Make sure that you call for this appointment  within a day or two after you arrive home to insure a convenient appointment time.  WHEN TO CALL YOUR DOCTOR: -- Fever greater than 101.5 -- Inability to urinate -- Nausea and/or vomiting - persistent -- Extreme swelling or bruising -- Continued bleeding from incision -- Increased pain, redness, or drainage from the incision -- Difficulty swallowing or  breathing -- Muscle cramping or spasms -- Numbness or tingling in hands or around lips  The clinic staff is available to answer your questions during regular business hours.  Please don't hesitate to call and ask to speak to one of the nurses if you have concerns.  Armandina Gemma, MD [facility] Office: [phone]   Increase activity slowly   Complete by:  As directed    Remove dressing in 24 hours   Complete by:  As directed      Allergies as of 07/17/2018      Reactions   Penicillins Rash   Has patient had a PCN reaction causing immediate rash, facial/tongue/throat swelling, SOB or lightheadedness with hypotension: Yes Has patient had a PCN reaction causing severe rash involving mucus membranes or skin necrosis: No Has patient had a PCN reaction that required hospitalization: No Has patient had a PCN reaction occurring within the last 10 years: No If all of the above answers are "NO", then may proceed with Cephalosporin use.   Sulfonamide Derivatives Rash      Medication List    TAKE these medications   acetaminophen 500 MG tablet Commonly known as:  TYLENOL Take 500 mg by mouth daily as needed for moderate pain or headache.   albuterol 108 (90 Base) MCG/ACT inhaler Commonly known as:  PROVENTIL HFA;VENTOLIN HFA Inhale 2 puffs into the lungs every 6 (six) hours as needed for wheezing.   HYDROcodone-acetaminophen 5-325 MG tablet Commonly known as:  NORCO/VICODIN Take 1-2 tablets by mouth every 4 (four) hours as needed for moderate pain.   levothyroxine 50 MCG tablet Commonly known as:  SYNTHROID, LEVOTHROID Take 50 mcg by mouth daily before breakfast.   multivitamin tablet Take 1 tablet by mouth daily.   naproxen sodium 220 MG tablet Commonly known as:  ALEVE Take 220 mg by mouth daily as needed (pain).   omeprazole 40 MG capsule Commonly known as:  PRILOSEC Take 40 mg by mouth daily.   pantoprazole 40 MG tablet Commonly known as:   PROTONIX Take 1 tablet (40 mg total) by mouth daily.   SUMAtriptan 100 MG tablet Commonly known as:  IMITREX TAKE ONE TABLET AS NEEDED FOR MIGRAINE. MAY REPEAT IN TWO HOURS IF NEEDED *MAX OF 2 TABLETS IN 24 HOURS*        Earnstine Regal, MD, [facility] Office: [phone]   Signed: Earnstine Regal 07/17/2018, 10:03 AM

## 2018-07-17 NOTE — Progress Notes (Signed)
Gauze dressing placed over incision to neck.

## 2018-08-27 DIAGNOSIS — E890 Postprocedural hypothyroidism: Secondary | ICD-10-CM | POA: Diagnosis not present

## 2018-09-17 DIAGNOSIS — S83512A Sprain of anterior cruciate ligament of left knee, initial encounter: Secondary | ICD-10-CM | POA: Diagnosis not present

## 2018-09-17 DIAGNOSIS — G8929 Other chronic pain: Secondary | ICD-10-CM | POA: Diagnosis not present

## 2018-10-08 DIAGNOSIS — R29898 Other symptoms and signs involving the musculoskeletal system: Secondary | ICD-10-CM | POA: Diagnosis not present

## 2018-10-08 DIAGNOSIS — S83512A Sprain of anterior cruciate ligament of left knee, initial encounter: Secondary | ICD-10-CM | POA: Diagnosis not present

## 2018-10-29 DIAGNOSIS — E890 Postprocedural hypothyroidism: Secondary | ICD-10-CM | POA: Diagnosis not present

## 2018-12-14 DIAGNOSIS — K219 Gastro-esophageal reflux disease without esophagitis: Secondary | ICD-10-CM | POA: Diagnosis not present

## 2018-12-14 DIAGNOSIS — R1013 Epigastric pain: Secondary | ICD-10-CM | POA: Diagnosis not present

## 2018-12-21 DIAGNOSIS — D225 Melanocytic nevi of trunk: Secondary | ICD-10-CM | POA: Diagnosis not present

## 2018-12-21 DIAGNOSIS — L821 Other seborrheic keratosis: Secondary | ICD-10-CM | POA: Diagnosis not present

## 2018-12-21 DIAGNOSIS — D224 Melanocytic nevi of scalp and neck: Secondary | ICD-10-CM | POA: Diagnosis not present

## 2018-12-23 DIAGNOSIS — N3000 Acute cystitis without hematuria: Secondary | ICD-10-CM | POA: Diagnosis not present

## 2018-12-28 DIAGNOSIS — Z1231 Encounter for screening mammogram for malignant neoplasm of breast: Secondary | ICD-10-CM | POA: Diagnosis not present

## 2018-12-28 DIAGNOSIS — N898 Other specified noninflammatory disorders of vagina: Secondary | ICD-10-CM | POA: Diagnosis not present

## 2018-12-28 DIAGNOSIS — R350 Frequency of micturition: Secondary | ICD-10-CM | POA: Diagnosis not present

## 2018-12-28 DIAGNOSIS — L280 Lichen simplex chronicus: Secondary | ICD-10-CM | POA: Diagnosis not present

## 2018-12-28 DIAGNOSIS — Z01419 Encounter for gynecological examination (general) (routine) without abnormal findings: Secondary | ICD-10-CM | POA: Diagnosis not present

## 2018-12-31 DIAGNOSIS — E890 Postprocedural hypothyroidism: Secondary | ICD-10-CM | POA: Diagnosis not present

## 2019-01-08 ENCOUNTER — Other Ambulatory Visit: Payer: Self-pay

## 2019-01-08 MED ORDER — ALBUTEROL SULFATE HFA 108 (90 BASE) MCG/ACT IN AERS: 2.0000 | INHALATION_SPRAY | Freq: Four times a day (QID) | RESPIRATORY_TRACT | 3 refills | Status: DC | PRN

## 2019-01-12 DIAGNOSIS — R399 Unspecified symptoms and signs involving the genitourinary system: Secondary | ICD-10-CM | POA: Diagnosis not present

## 2019-01-13 ENCOUNTER — Other Ambulatory Visit: Payer: Self-pay | Admitting: *Deleted

## 2019-01-13 MED ORDER — SUMATRIPTAN SUCCINATE 100 MG PO TABS: ORAL_TABLET | ORAL | 0 refills | Status: DC

## 2019-01-18 ENCOUNTER — Encounter: Payer: Self-pay | Admitting: Family Medicine

## 2019-01-18 DIAGNOSIS — R1013 Epigastric pain: Secondary | ICD-10-CM | POA: Diagnosis not present

## 2019-01-18 DIAGNOSIS — K2930 Chronic superficial gastritis without bleeding: Secondary | ICD-10-CM | POA: Diagnosis not present

## 2019-01-18 DIAGNOSIS — R1310 Dysphagia, unspecified: Secondary | ICD-10-CM | POA: Diagnosis not present

## 2019-01-18 DIAGNOSIS — K228 Other specified diseases of esophagus: Secondary | ICD-10-CM | POA: Diagnosis not present

## 2019-02-16 DIAGNOSIS — N390 Urinary tract infection, site not specified: Secondary | ICD-10-CM | POA: Diagnosis not present

## 2019-02-16 DIAGNOSIS — R300 Dysuria: Secondary | ICD-10-CM | POA: Diagnosis not present

## 2019-02-16 DIAGNOSIS — R350 Frequency of micturition: Secondary | ICD-10-CM | POA: Diagnosis not present

## 2019-03-01 DIAGNOSIS — E890 Postprocedural hypothyroidism: Secondary | ICD-10-CM | POA: Diagnosis not present

## 2019-05-18 ENCOUNTER — Encounter: Payer: Self-pay | Admitting: Family Medicine

## 2019-05-18 ENCOUNTER — Other Ambulatory Visit: Payer: Self-pay

## 2019-05-18 ENCOUNTER — Ambulatory Visit: Payer: 59 | Admitting: Family Medicine

## 2019-05-18 VITALS — BP 96/72 | HR 66 | Temp 98.4°F | Ht 66.5 in | Wt 159.0 lb

## 2019-05-18 DIAGNOSIS — H60332 Swimmer's ear, left ear: Secondary | ICD-10-CM

## 2019-05-18 MED ORDER — CIPROFLOXACIN-HYDROCORTISONE 0.2-1 % OT SUSP: 3.0000 [drp] | Freq: Two times a day (BID) | OTIC | 0 refills | Status: DC

## 2019-05-18 NOTE — Progress Notes (Signed)
Subjective:     Anna [name] is a 48 y.o. female presenting for Ear Pain (left ear. since 05/13/2019. ear feels full.)     Otalgia  There is pain in the left ear. This is a new problem. The current episode started in the past 7 days. Episode frequency: aching more at night. The problem has been gradually worsening. There has been no fever. Associated symptoms include ear discharge. Pertinent negatives include no abdominal pain, coughing, headaches, rhinorrhea or sore throat. She has tried nothing for the symptoms.     Will get irritated with water in her ear. Improves with antibiotic drops in the past   Review of Systems  HENT: Positive for ear discharge and ear pain. Negative for rhinorrhea and sore throat.   Respiratory: Negative for cough.   Gastrointestinal: Negative for abdominal pain.  Neurological: Negative for headaches.     Social History   Tobacco Use  Smoking Status Former Smoker  Smokeless Tobacco Never Used  Tobacco Comment   quit over 16 years ago        Objective:    BP Readings from Last 3 Encounters:  05/18/19 96/72  07/17/18 102/69  07/09/18 116/66   Wt Readings from Last 3 Encounters:  05/18/19 159 lb (72.1 kg)  07/16/18 160 lb (72.6 kg)  07/09/18 158 lb 6.4 oz (71.8 kg)    BP 96/72   Pulse 66   Temp 98.4 F (36.9 C)   Ht 5' 6.5" (1.689 m)   Wt 159 lb (72.1 kg)   SpO2 98%   BMI 25.28 kg/m    Physical Exam Constitutional:      General: She is not in acute distress.    Appearance: She is well-developed. She is not diaphoretic.  HENT:     Head: Normocephalic and atraumatic.     Right Ear: Tympanic membrane and ear canal normal.     Left Ear: Tympanic membrane and ear canal normal. Tenderness present.  No middle ear effusion.     Nose: Mucosal edema present. No rhinorrhea.     Right Sinus: No maxillary sinus tenderness or frontal sinus tenderness.     Left Sinus: No maxillary sinus tenderness or frontal sinus tenderness.   Mouth/Throat:     Pharynx: Uvula midline. No oropharyngeal exudate or posterior oropharyngeal erythema.     Tonsils: 0 on the right. 0 on the left.  Eyes:     General: No scleral icterus.    Conjunctiva/sclera: Conjunctivae normal.  Neck:     Musculoskeletal: Neck supple.  Cardiovascular:     Rate and Rhythm: Normal rate and regular rhythm.     Heart sounds: Normal heart sounds. No murmur.  Pulmonary:     Effort: Pulmonary effort is normal. No respiratory distress.     Breath sounds: Normal breath sounds.  Lymphadenopathy:     Cervical: No cervical adenopathy.  Skin:    General: Skin is warm and dry.     Capillary Refill: Capillary refill takes less than 2 seconds.  Neurological:     Mental Status: She is alert.           Assessment & Plan:   Problem List Items Addressed This Visit    None    Visit Diagnoses    Acute swimmer's ear of left side    -  Primary   Relevant Medications   ciprofloxacin-hydrocortisone (CIPRO HC OTIC) OTIC suspension     Given hx and improvement with prior episodes suspect otitis externa,  though exam without significant changes.   Advised watch and wait and start Abx if not improving over the next few days Ibuprofen/tylenol for pain  Return if symptoms worsen or fail to improve.  Lesleigh Noe, MD

## 2019-05-18 NOTE — Patient Instructions (Signed)
Recommend watch and wait - if worsening start drops, if not improvement in 2-3 days start drops Ibuprofen or tylenol for pain   Otitis Externa  Otitis externa is an infection of the outer ear canal. The outer ear canal is the area between the outside of the ear and the eardrum. Otitis externa is sometimes called swimmer's ear. What are the causes? Common causes of this condition include:  Swimming in dirty water.  Moisture in the ear.  An injury to the inside of the ear.  An object stuck in the ear.  A cut or scrape on the outside of the ear. What increases the risk? You are more likely to get this condition if you go swimming often. What are the signs or symptoms?  Itching in the ear. This is often the first symptom.  Swelling of the ear.  Redness in the ear.  Ear pain. The pain may get worse when you pull on your ear.  Pus coming from the ear. How is this treated? This condition may be treated with:  Antibiotic ear drops. These are often given for 10-14 days.  Medicines to reduce itching and swelling. Follow these instructions at home:  If you were given antibiotic ear drops, use them as told by your doctor. Do not stop using them even if your condition gets better.  Take over-the-counter and prescription medicines only as told by your doctor.  Avoid getting water in your ears as told by your doctor. You may be told to avoid swimming or water sports for a few days.  Keep all follow-up visits as told by your doctor. This is important. How is this prevented?  Keep your ears dry. Use the corner of a towel to dry your ears after you swim or bathe.  Try not to scratch or put things in your ear. Doing these things makes it easier for germs to grow in your ear.  Avoid swimming in lakes, dirty water, or pools that may not have the right amount of a chemical called chlorine. Contact a doctor if:  You have a fever.  Your ear is still red, swollen, or painful after 3  days.  You still have pus coming from your ear after 3 days.  Your redness, swelling, or pain gets worse.  You have a really bad headache.  You have redness, swelling, pain, or tenderness behind your ear. Summary  Otitis externa is an infection of the outer ear canal.  Symptoms include pain, redness, and swelling of the ear.  If you were given antibiotic ear drops, use them as told by your doctor. Do not stop using them even if your condition gets better.  Try not to scratch or put things in your ear. This information is not intended to replace advice given to you by your health care provider. Make sure you discuss any questions you have with your health care provider. Document Released: 04/01/2008 Document Revised: 03/20/2018 Document Reviewed: 03/20/2018 Elsevier Patient Education  2020 [employer].

## 2019-09-01 ENCOUNTER — Other Ambulatory Visit: Payer: Self-pay | Admitting: Endocrinology

## 2019-09-01 DIAGNOSIS — E041 Nontoxic single thyroid nodule: Secondary | ICD-10-CM

## 2019-09-09 ENCOUNTER — Ambulatory Visit
Admission: RE | Admit: 2019-09-09 | Discharge: 2019-09-09 | Disposition: A | Discharge status: Home / Self Care | Payer: 59 | Source: Ambulatory Visit | Attending: Endocrinology | Admitting: Endocrinology

## 2019-09-09 DIAGNOSIS — E041 Nontoxic single thyroid nodule: Secondary | ICD-10-CM

## 2019-09-30 ENCOUNTER — Telehealth: Payer: Self-pay | Admitting: Family Medicine

## 2019-09-30 NOTE — Telephone Encounter (Signed)
Pt called wanting to see if her blood type was on her chart somewhere.     She stated she had this done when she was pregnant. I asked her if she had called her ob  She stated she wanted to see if it was in her chart here.

## 2019-09-30 NOTE — Telephone Encounter (Signed)
Patient notified by telephone that her blood type is O negative per consult note record from 03/12/2011.

## 2019-11-03 ENCOUNTER — Other Ambulatory Visit: Payer: Self-pay | Admitting: Family Medicine

## 2019-11-03 NOTE — Telephone Encounter (Signed)
Please schedule CPE with fasting labs with Dr. Diona Browner or at least a follow up migraine appointment.  Then send back to me to refill meds.

## 2019-11-04 NOTE — Telephone Encounter (Signed)
Labs 1/18 cpx 1/21 Pt aware

## 2019-11-08 ENCOUNTER — Ambulatory Visit (INDEPENDENT_AMBULATORY_CARE_PROVIDER_SITE_OTHER): Payer: 59 | Admitting: Internal Medicine

## 2019-11-08 ENCOUNTER — Encounter: Payer: Self-pay | Admitting: Internal Medicine

## 2019-11-08 ENCOUNTER — Other Ambulatory Visit: Payer: Self-pay

## 2019-11-08 VITALS — BP 92/68 | HR 68 | Temp 97.3°F | Resp 16 | Ht 68.0 in | Wt 153.0 lb

## 2019-11-08 DIAGNOSIS — R350 Frequency of micturition: Secondary | ICD-10-CM

## 2019-11-08 DIAGNOSIS — N3000 Acute cystitis without hematuria: Secondary | ICD-10-CM | POA: Insufficient documentation

## 2019-11-08 LAB — POCT URINALYSIS DIPSTICK
Bilirubin, UA: NEGATIVE
Blood, UA: NEGATIVE
Glucose, UA: NEGATIVE
Ketones, UA: NEGATIVE
Leukocytes, UA: NEGATIVE
Nitrite, UA: NEGATIVE
Protein, UA: NEGATIVE
Spec Grav, UA: 1.02 (ref 1.010–1.025)
Urobilinogen, UA: 0.2 E.U./dL
pH, UA: 5.5 (ref 5.0–8.0)

## 2019-11-08 MED ORDER — CEFUROXIME AXETIL 250 MG PO TABS: 250.0000 mg | ORAL_TABLET | Freq: Two times a day (BID) | ORAL | 0 refills | Status: DC

## 2019-11-08 NOTE — Progress Notes (Signed)
Subjective:    Patient ID: Anna [name], female    DOB: [birth year], 49 y.o.   MRN: [id number]  HPI Here due to urinary symptoms  This visit occurred during the SARS-CoV-2 public health emergency.  Safety protocols were in place, including screening questions prior to the visit, additional usage of staff PPE, and extensive cleaning of exam room while observing appropriate contact time as indicated for disinfecting solutions.   Had bad UTI in February--so now nervous about early symptoms Started with twinge 2 days ago Suprapubic pressure Some increased frequency No dysuria or hematuria No fever--but felt chilled yesterday  Used some left over uribel---did reduce symptoms (but only briefly)  Current Outpatient Medications on File Prior to Visit  Medication Sig Dispense Refill  . acetaminophen (TYLENOL) 500 MG tablet Take 500 mg by mouth daily as needed for moderate pain or headache.    . albuterol (PROVENTIL HFA;VENTOLIN HFA) 108 (90 Base) MCG/ACT inhaler Inhale 2 puffs into the lungs every 6 (six) hours as needed for wheezing. NEED PHYSICAL APPOINTMENT 1 Inhaler 3  . ciprofloxacin-hydrocortisone (CIPRO HC OTIC) OTIC suspension Place 3 drops into the left ear 2 (two) times daily. 10 mL 0  . clotrimazole-betamethasone (LOTRISONE) cream Lotrisone 1 %-0.05 % topical cream  APPLY TO THE AFFECTED AND SURROUNDING AREAS OF SKIN BY TOPICAL ROUTE 2 TIMES PER DAY IN THE MORNING AND EVENING    . levothyroxine (SYNTHROID, LEVOTHROID) 50 MCG tablet Take 50 mcg by mouth daily before breakfast.    . Meth-Hyo-M Bl-Na Phos-Ph Sal (URIBEL) 118 MG CAPS Uribel 118 mg-10 mg-40.8 mg-36 mg capsule  1 po tid x 5 days then prn    . Multiple Vitamin (MULTIVITAMIN) tablet Take 1 tablet by mouth daily.      . naproxen sodium (ALEVE) 220 MG tablet Take 220 mg by mouth daily as needed (pain).    . SUMAtriptan (IMITREX) 100 MG tablet TAKE ONE TABLET AS NEEDED FOR MIGRAINE. MAY REPEAT IN TWO HOURS IF NEEDED *MAX OF 2  TABLETS IN 24 HOURS* 9 tablet 0   No current facility-administered medications on file prior to visit.    Allergies  Allergen Reactions  . Penicillins Rash    Has patient had a PCN reaction causing immediate rash, facial/tongue/throat swelling, SOB or lightheadedness with hypotension: Yes Has patient had a PCN reaction causing severe rash involving mucus membranes or skin necrosis: No Has patient had a PCN reaction that required hospitalization: No Has patient had a PCN reaction occurring within the last 10 years: No If all of the above answers are "NO", then may proceed with Cephalosporin use.  . Sulfonamide Derivatives Rash    Past Medical History:  Diagnosis Date  . Abdominal pain, other specified site   . Abnormal Pap smear    Age 48-17  . Acute maxillary sinusitis   . Allergic rhinitis, cause unspecified   . Candidiasis of vulva and vagina   . Diverticulitis 08/29/11  . Eczema 2006   Rectal  . Endometriosis 08/29/11  . Endometriosis of ovary   . Esophageal reflux   . Fibroid   . H/O bladder infections   . H/O dysmenorrhea 2008  . H/O menorrhagia 2008  . H/O varicella   . H/O varicose veins   . Hemorrhoid 2007  . Leukorrhea 11/25/11  . LLQ pain 2010  . Migraine without aura, without mention of intractable migraine without mention of status migrainosus   . Mitral valve prolapse   . Myalgia and myositis, unspecified   .  Other atopic dermatitis and related conditions   . Ovarian cyst 2008   Left  . Pelvic pressure in female 2006  . Personal history of malignant neoplasm of thyroid   . PONV (postoperative nausea and vomiting)   . Proteinuria 2010  . Rectal itching 2006  . Unspecified tinnitus    Left  . Vaginal candida 2007  . Vaginitis and vulvovaginitis, unspecified   . Yeast vaginitis 2010    Past Surgical History:  Procedure Laterality Date  . LAPAROSCOPIC OVARIAN CYSTECTOMY    . THYROID LOBECTOMY Left 07/16/2018   Procedure: LEFT  THYROID LOBECTOMY;   Surgeon: Armandina Gemma, MD;  Location: [facility];  Service: General;  Laterality: Left;  . WISDOM TOOTH EXTRACTION      Family History  Problem Relation Age of Onset  . Healthy Father   . Colon polyps Father   . Thyroid cancer Mother   . Irritable bowel syndrome Mother   . Diabetes Maternal Grandmother   . Diabetes Maternal Grandfather   . Other Paternal Grandfather        CABG age 27  . Healthy Brother     Social History   Socioeconomic History  . Marital status: Married    Spouse name: Not on file  . Number of children: Not on file  . Years of education: Not on file  . Highest education level: Not on file  Occupational History  . Occupation: stay at home mom; part time admin. asst    Employer: [employer]  Tobacco Use  . Smoking status: Former Research scientist (life sciences)  . Smokeless tobacco: Never Used  . Tobacco comment: quit over 16 years ago  Substance and Sexual Activity  . Alcohol use: Yes    Alcohol/week: 1.0 - 2.0 standard drinks    Types: 1 - 2 Standard drinks or equivalent per week    Comment: wine; beer  . Drug use: No  . Sexual activity: Yes    Birth control/protection: Injection    Comment: Husband had vasectomy  Other Topics Concern  . Not on file  Social History Narrative   Stay at home mom; part time administrative asst      Married      Children      Regular exercise-yes 3 days/week cardio 45 min      3 meals; fruit/veggies, no soda, lots of water   Social Determinants of Health   Financial Resource Strain:   . Difficulty of Paying Living Expenses: Not on file  Food Insecurity:   . Worried About Charity fundraiser in the Last Year: Not on file  . Ran Out of Food in the Last Year: Not on file  Transportation Needs:   . Lack of Transportation (Medical): Not on file  . Lack of Transportation (Non-Medical): Not on file  Physical Activity:   . Days of Exercise per Week: Not on file  . Minutes of Exercise per Session: Not on file  Stress:   . Feeling of  Stress : Not on file  Social Connections:   . Frequency of Communication with Friends and Family: Not on file  . Frequency of Social Gatherings with Friends and Family: Not on file  . Attends Religious Services: Not on file  . Active Member of Clubs or Organizations: Not on file  . Attends Archivist Meetings: Not on file  . Marital Status: Not on file  Intimate Partner Violence:   . Fear of Current or Ex-Partner: Not on file  .  Emotionally Abused: Not on file  . Physically Abused: Not on file  . Sexually Abused: Not on file   Review of Systems  Slight nausea yesterday morning No vomiting Eating okay No back pain Has had sinus symptoms for about a month---bad tasting drainage now (thinks she has a sinus infection)     Objective:   Physical Exam  Constitutional: She appears well-developed. No distress.  GI: Soft. She exhibits no distension. There is no abdominal tenderness. There is no rebound and no guarding.  Musculoskeletal:     Comments: No CVA tenderness           Assessment & Plan:

## 2019-11-08 NOTE — Assessment & Plan Note (Signed)
Has fairly classic, though mild, symptoms Urinalysis benign Had culture from last year---macrobid was resistant Also with sinus symptoms Will treat with cefuroxime for 3-7 days Empiric cipro if that doesn't work

## 2019-11-11 ENCOUNTER — Telehealth: Payer: Self-pay

## 2019-11-11 NOTE — Telephone Encounter (Signed)
LVM w COVID screen, front door and back lab 1.14.2021 TLJ

## 2019-11-15 ENCOUNTER — Other Ambulatory Visit (INDEPENDENT_AMBULATORY_CARE_PROVIDER_SITE_OTHER): Payer: 59

## 2019-11-15 ENCOUNTER — Telehealth: Payer: Self-pay | Admitting: Family Medicine

## 2019-11-15 ENCOUNTER — Other Ambulatory Visit: Payer: Self-pay

## 2019-11-15 DIAGNOSIS — Z13220 Encounter for screening for lipoid disorders: Secondary | ICD-10-CM | POA: Diagnosis not present

## 2019-11-15 DIAGNOSIS — E039 Hypothyroidism, unspecified: Secondary | ICD-10-CM

## 2019-11-15 LAB — COMPREHENSIVE METABOLIC PANEL
ALT: 14 U/L (ref 0–35)
AST: 18 U/L (ref 0–37)
Albumin: 4.1 g/dL (ref 3.5–5.2)
Alkaline Phosphatase: 52 U/L (ref 39–117)
BUN: 10 mg/dL (ref 6–23)
CO2: 28 mEq/L (ref 19–32)
Calcium: 9 mg/dL (ref 8.4–10.5)
Chloride: 101 mEq/L (ref 96–112)
Creatinine, Ser: 0.76 mg/dL (ref 0.40–1.20)
GFR: 81.61 mL/min (ref >60.00)
Glucose, Bld: 93 mg/dL (ref 70–99)
Potassium: 3.8 mEq/L (ref 3.5–5.1)
Sodium: 135 mEq/L (ref 135–145)
Total Bilirubin: 0.5 mg/dL (ref 0.2–1.2)
Total Protein: 6.5 g/dL (ref 6.0–8.3)

## 2019-11-15 LAB — T3, FREE: T3, Free: 3 pg/mL (ref 2.3–4.2)

## 2019-11-15 LAB — LIPID PANEL
Cholesterol: 192 mg/dL (ref 0–200)
HDL: 81.3 mg/dL (ref >39.00)
LDL Cholesterol: 92 mg/dL (ref 0–99)
NonHDL: 111.09
Total CHOL/HDL Ratio: 2
Triglycerides: 95 mg/dL (ref 0.0–149.0)
VLDL: 19 mg/dL (ref 0.0–40.0)

## 2019-11-15 LAB — T4, FREE: Free T4: 1.12 ng/dL (ref 0.60–1.60)

## 2019-11-15 LAB — TSH: TSH: 2.14 u[IU]/mL (ref 0.35–4.50)

## 2019-11-15 NOTE — Telephone Encounter (Signed)
-----   Message from Ellamae Sia sent at 11/09/2019  2:25 PM EST ----- Regarding: Lab orders for Monday, 1.18.21 Patient is scheduled for CPX labs, please order future labs, Thanks , Karna Christmas

## 2019-11-16 NOTE — Progress Notes (Signed)
No critical labs need to be addressed urgently. We will discuss labs in detail at upcoming office visit.   

## 2019-11-18 ENCOUNTER — Ambulatory Visit (INDEPENDENT_AMBULATORY_CARE_PROVIDER_SITE_OTHER): Payer: 59 | Admitting: Family Medicine

## 2019-11-18 ENCOUNTER — Encounter: Payer: Self-pay | Admitting: Family Medicine

## 2019-11-18 VITALS — Temp 98.0°F | Ht 68.0 in | Wt 150.5 lb

## 2019-11-18 DIAGNOSIS — N3000 Acute cystitis without hematuria: Secondary | ICD-10-CM

## 2019-11-18 DIAGNOSIS — K219 Gastro-esophageal reflux disease without esophagitis: Secondary | ICD-10-CM

## 2019-11-18 DIAGNOSIS — E039 Hypothyroidism, unspecified: Secondary | ICD-10-CM

## 2019-11-18 DIAGNOSIS — G43019 Migraine without aura, intractable, without status migrainosus: Secondary | ICD-10-CM

## 2019-11-18 DIAGNOSIS — Z0000 Encounter for general adult medical examination without abnormal findings: Secondary | ICD-10-CM

## 2019-11-18 DIAGNOSIS — J4540 Moderate persistent asthma, uncomplicated: Secondary | ICD-10-CM | POA: Diagnosis not present

## 2019-11-18 MED ORDER — ALBUTEROL SULFATE HFA 108 (90 BASE) MCG/ACT IN AERS: 2.0000 | INHALATION_SPRAY | Freq: Four times a day (QID) | RESPIRATORY_TRACT | 0 refills | Status: DC | PRN

## 2019-11-18 MED ORDER — ELETRIPTAN HYDROBROMIDE 40 MG PO TABS: 40.0000 mg | ORAL_TABLET | ORAL | 0 refills | Status: DC | PRN

## 2019-11-18 MED ORDER — FLUCONAZOLE 150 MG PO TABS: 150.0000 mg | ORAL_TABLET | Freq: Once | ORAL | 0 refills | Status: AC

## 2019-11-18 NOTE — Assessment & Plan Note (Signed)
Improved after antibiotics but still some urethral irritation.  Treat with fluconazole, stop detox tea and increase water intake.

## 2019-11-18 NOTE — Patient Instructions (Addendum)
Increase water, stop detox tea. If still with urinary opening irritaion.. call for re-evaluation next week.

## 2019-11-18 NOTE — Progress Notes (Signed)
Chief Complaint  Patient presents with  . Annual Exam    History of Present Illness: HPI   The patient is here for annual wellness exam and preventative care.    Labs reviewed in detail with pt  Lab Results  Component Value Date   CHOL 192 11/15/2019   HDL 81.30 11/15/2019   LDLCALC 92 11/15/2019   TRIG 95.0 11/15/2019   CHOLHDL 2 11/15/2019   Moderate persistent asthma: stable.  Rarely using albuterol.  Seen for UTI on 11/08/2019 by Dr. Silvio Pate. Treated with cefuoxime 7 days.  She has rare twinge of  Urethral opening, urgency and frequency is resolved. She does have sensitive skin.  She has recently started a detox tea.  no redness, no ulcers.  Hypothyroid   Lab Results  Component Value Date   TSH 2.14 11/15/2019   Wt Readings from Last 3 Encounters:  11/18/19 150 lb 8 oz (68.3 kg)  11/08/19 153 lb (69.4 kg)  05/18/19 159 lb (72.1 kg)     HX of GERD:  Stable back on prilosec 40 mg daily.  She has had menopausal change.[name] spotting in last several month then missing months, last menses 40 days off.   Migraine occurring more frequently. Occurring 2-3 month. Imitrex only helps occasionally.  This visit occurred during the SARS-CoV-2 public health emergency.  Safety protocols were in place, including screening questions prior to the visit, additional usage of staff PPE, and extensive cleaning of exam room while observing appropriate contact time as indicated for disinfecting solutions.   COVID 19 screen:  No recent travel or known exposure to COVID19 The patient denies respiratory symptoms of COVID 19 at this time. The importance of social distancing was discussed today.     Review of Systems  Constitutional: Negative for chills and fever.  HENT: Negative for congestion and ear pain.   Eyes: Negative for pain and redness.  Respiratory: Negative for cough and shortness of breath.   Cardiovascular: Negative for chest pain, palpitations and leg swelling.   Gastrointestinal: Negative for abdominal pain, blood in stool, constipation, diarrhea, nausea and vomiting.  Genitourinary: Negative for dysuria.  Musculoskeletal: Negative for falls and myalgias.  Skin: Negative for rash.  Neurological: Negative for dizziness.  Psychiatric/Behavioral: Negative for depression. The patient is not nervous/anxious.       Office Visit from 11/18/2019 in [place] at [facility] Total Score  1     Seeing a counselor.    Past Medical History:  Diagnosis Date  . Abdominal pain, other specified site   . Abnormal Pap smear    Age 49-17  . Acute maxillary sinusitis   . Allergic rhinitis, cause unspecified   . Candidiasis of vulva and vagina   . Diverticulitis 08/29/11  . Eczema 2006   Rectal  . Endometriosis 08/29/11  . Endometriosis of ovary   . Esophageal reflux   . Fibroid   . H/O bladder infections   . H/O dysmenorrhea 2008  . H/O menorrhagia 2008  . H/O varicella   . H/O varicose veins   . Hemorrhoid 2007  . Leukorrhea 11/25/11  . LLQ pain 2010  . Migraine without aura, without mention of intractable migraine without mention of status migrainosus   . Mitral valve prolapse   . Myalgia and myositis, unspecified   . Other atopic dermatitis and related conditions   . Ovarian cyst 2008   Left  . Pelvic pressure in female 2006  . Personal history of malignant neoplasm of  thyroid   . PONV (postoperative nausea and vomiting)   . Proteinuria 2010  . Rectal itching 2006  . Unspecified tinnitus    Left  . Vaginal candida 2007  . Vaginitis and vulvovaginitis, unspecified   . Yeast vaginitis 2010    reports that she has quit smoking. She has never used smokeless tobacco. She reports current alcohol use of about 1.0 - 2.0 standard drinks of alcohol per week. She reports that she does not use drugs.   Current Outpatient Medications:  .  acetaminophen (TYLENOL) 500 MG tablet, Take 500 mg by mouth daily as needed for moderate pain or  headache., Disp: , Rfl:  .  albuterol (PROVENTIL HFA;VENTOLIN HFA) 108 (90 Base) MCG/ACT inhaler, Inhale 2 puffs into the lungs every 6 (six) hours as needed for wheezing. NEED PHYSICAL APPOINTMENT, Disp: 1 Inhaler, Rfl: 3 .  clotrimazole-betamethasone (LOTRISONE) cream, Lotrisone 1 %-0.05 % topical cream  APPLY TO THE AFFECTED AND SURROUNDING AREAS OF SKIN BY TOPICAL ROUTE 2 TIMES PER DAY IN THE MORNING AND EVENING, Disp: , Rfl:  .  levothyroxine (SYNTHROID, LEVOTHROID) 50 MCG tablet, Take 50 mcg by mouth daily before breakfast., Disp: , Rfl:  .  Meth-Hyo-M Bl-Na Phos-Ph Sal (URIBEL) 118 MG CAPS, Uribel 118 mg-10 mg-40.8 mg-36 mg capsule  1 po tid x 5 days then prn, Disp: , Rfl:  .  Multiple Vitamin (MULTIVITAMIN) tablet, Take 1 tablet by mouth daily.  , Disp: , Rfl:  .  naproxen sodium (ALEVE) 220 MG tablet, Take 220 mg by mouth daily as needed (pain)., Disp: , Rfl:  .  SUMAtriptan (IMITREX) 100 MG tablet, TAKE ONE TABLET AS NEEDED FOR MIGRAINE. MAY REPEAT IN TWO HOURS IF NEEDED *MAX OF 2 TABLETS IN 24 HOURS*, Disp: 9 tablet, Rfl: 0   Observations/Objective: Temperature 98 F (36.7 C), temperature source Tympanic, height 5\' 8"  (1.727 m), weight 150 lb 8 oz (68.3 kg), last menstrual period 09/07/2019.   BP Readings from Last 3 Encounters:  11/08/19 92/68  05/18/19 96/72  07/17/18 102/69    Physical Exam  Physical Exam Constitutional:      General: The patient is not in acute distress. Pulmonary:     Effort: Pulmonary effort is normal. No respiratory distress.  Neurological:     Mental Status: The patient is alert and oriented to person, place, and time.  Psychiatric:        Mood and Affect: Mood normal.        Behavior: Behavior normal.   Assessment and Plan    The patient's preventative maintenance and recommended screening tests for an annual wellness exam were reviewed in full today. Brought up to date unless services declined.  Counselled on the importance of diet, exercise,  and its role in overall health and mortality. The patient's FH and SH was reviewed, including their home life, tobacco status, and drug and alcohol status.   Vaccines: Refused flu 2021 Pap/DVE: , Last pap 2018, negative Followed by GYN, Dr. Cletis Media Mammo:  11/2017 per GYN, repeat due Colon: no early family history neg 2011, Dr. Henrene Pastor.  Smoking Status: none ETOH/ drug use: 1-2 glasses of wine a week/none HIV screen:  refused   Eliezer Lofts, MD

## 2019-11-18 NOTE — Assessment & Plan Note (Signed)
Stable control, rare use of albuterol.

## 2019-11-18 NOTE — Assessment & Plan Note (Signed)
Increased frequency but she is not interested in preventative medication. Imitrex not always helping.. will try relpax.

## 2019-11-18 NOTE — Assessment & Plan Note (Signed)
Stable control  With diet.

## 2019-11-18 NOTE — Assessment & Plan Note (Signed)
Well controlled. Continue current medication.  

## 2020-05-30 ENCOUNTER — Other Ambulatory Visit: Payer: Self-pay | Admitting: Family Medicine

## 2020-05-30 DIAGNOSIS — R5383 Other fatigue: Secondary | ICD-10-CM

## 2020-06-01 ENCOUNTER — Other Ambulatory Visit: Payer: Self-pay

## 2020-06-01 ENCOUNTER — Other Ambulatory Visit (INDEPENDENT_AMBULATORY_CARE_PROVIDER_SITE_OTHER): Payer: 59

## 2020-06-01 ENCOUNTER — Telehealth: Payer: Self-pay

## 2020-06-01 DIAGNOSIS — R5383 Other fatigue: Secondary | ICD-10-CM | POA: Diagnosis not present

## 2020-06-01 LAB — T3, FREE: T3, Free: 3.1 pg/mL (ref 2.3–4.2)

## 2020-06-01 LAB — CBC WITH DIFFERENTIAL/PLATELET
Basophils Absolute: 0 10*3/uL (ref 0.0–0.1)
Basophils Relative: 0.5 % (ref 0.0–3.0)
Eosinophils Absolute: 0.4 10*3/uL (ref 0.0–0.7)
Eosinophils Relative: 6.4 % — ABNORMAL HIGH (ref 0.0–5.0)
HCT: 41.3 % (ref 36.0–46.0)
Hemoglobin: 14.2 g/dL (ref 12.0–15.0)
Lymphocytes Relative: 39.4 % (ref 12.0–46.0)
Lymphs Abs: 2.3 10*3/uL (ref 0.7–4.0)
MCHC: 34.4 g/dL (ref 30.0–36.0)
MCV: 91.9 fl (ref 78.0–100.0)
Monocytes Absolute: 0.6 10*3/uL (ref 0.1–1.0)
Monocytes Relative: 10.8 % (ref 3.0–12.0)
Neutro Abs: 2.5 10*3/uL (ref 1.4–7.7)
Neutrophils Relative %: 42.9 % — ABNORMAL LOW (ref 43.0–77.0)
Platelets: 222 10*3/uL (ref 150.0–400.0)
RBC: 4.5 Mil/uL (ref 3.87–5.11)
RDW: 13.7 % (ref 11.5–15.5)
WBC: 5.8 10*3/uL (ref 4.0–10.5)

## 2020-06-01 LAB — T4, FREE: Free T4: 1.22 ng/dL (ref 0.60–1.60)

## 2020-06-01 LAB — VITAMIN D 25 HYDROXY (VIT D DEFICIENCY, FRACTURES): VITD: 46.77 ng/mL (ref 30.00–100.00)

## 2020-06-01 LAB — VITAMIN B12: Vitamin B-12: 407 pg/mL (ref 211–911)

## 2020-06-01 LAB — TSH: TSH: 2.09 u[IU]/mL (ref 0.35–4.50)

## 2020-06-01 NOTE — Telephone Encounter (Signed)
Appointment 8/5

## 2020-06-01 NOTE — Telephone Encounter (Signed)
[facility] Night - Client Nonclinical Telephone Record AccessNurse Client [facility] Night - Client Client Site [place] - Night Physician Eliezer Lofts - MD Contact Type Call Who Is Calling Patient / Member / Family / Caregiver Caller Name [place] Phone Number [phone] Patient Name Anna [name] Patient DOB 29-Sep-1971 Call Type Message Only Information Provided Reason for Call Request to Schedule Office Appointment Initial Comment Caller states he needs to make an appointment for labs. Additional Comment Caller states she would like someone from the office to call her. Office hours provided. Disp. Time Disposition Final User 05/31/2020 5:10:21 PM General Information Provided Yes Wine, Teddi Call Closed By: Christain [place] Transaction Date/Time: 05/31/2020 5:08:09 PM (ET)

## 2020-06-16 ENCOUNTER — Telehealth: Payer: 59 | Admitting: Physician Assistant

## 2020-06-16 ENCOUNTER — Encounter: Payer: Self-pay | Admitting: Physician Assistant

## 2020-06-16 DIAGNOSIS — J018 Other acute sinusitis: Secondary | ICD-10-CM | POA: Diagnosis not present

## 2020-06-16 MED ORDER — DOXYCYCLINE HYCLATE 100 MG PO TABS
100.0000 mg | ORAL_TABLET | Freq: Two times a day (BID) | ORAL | 0 refills | Status: DC
Start: 2020-06-16 — End: 2021-01-26

## 2020-06-16 NOTE — Progress Notes (Signed)
Antibiotic sent to pharmacy for sinus infection is NOT Augmentin due to allergy, have sent in Doxycyline 100mg  one pill twice daily for 10 days.

## 2020-06-16 NOTE — Addendum Note (Signed)
Addended by: Waldon Merl on: 06/16/2020 06:48 PM   Modules accepted: Orders

## 2020-06-16 NOTE — Progress Notes (Signed)
We are sorry that you are not feeling well.  Here is how we plan to help!  Based on what you have shared with me it looks like you have sinusitis.  Sinusitis is inflammation and infection in the sinus cavities of the head.  Based on your presentation I believe you most likely have Acute Bacterial Sinusitis.  This is an infection caused by bacteria and is treated with antibiotics. I have prescribed Augmentin 875mg/125mg one tablet twice daily with food, for 7 days. You may use an oral decongestant such as Mucinex D or if you have glaucoma or high blood pressure use plain Mucinex. Saline nasal spray help and can safely be used as often as needed for congestion.  If you develop worsening sinus pain, fever or notice severe headache and vision changes, or if symptoms are not better after completion of antibiotic, please schedule an appointment with a health care provider.    Sinus infections are not as easily transmitted as other respiratory infection, however we still recommend that you avoid close contact with loved ones, especially the very young and elderly.  Remember to wash your hands thoroughly throughout the day as this is the number one way to prevent the spread of infection!  Home Care:  Only take medications as instructed by your medical team.  Complete the entire course of an antibiotic.  Do not take these medications with alcohol.  A steam or ultrasonic humidifier can help congestion.  You can place a towel over your head and breathe in the steam from hot water coming from a faucet.  Avoid close contacts especially the very young and the elderly.  Cover your mouth when you cough or sneeze.  Always remember to wash your hands.  Get Help Right Away If:  You develop worsening fever or sinus pain.  You develop a severe head ache or visual changes.  Your symptoms persist after you have completed your treatment plan.  Make sure you  Understand these instructions.  Will watch your  condition.  Will get help right away if you are not doing well or get worse.  Your e-visit answers were reviewed by a board certified advanced clinical practitioner to complete your personal care plan.  Depending on the condition, your plan could have included both over the counter or prescription medications.  If there is a problem please reply  once you have received a response from your provider.  Your safety is important to us.  If you have drug allergies check your prescription carefully.    You can use MyChart to ask questions about today's visit, request a non-urgent call back, or ask for a work or school excuse for 24 hours related to this e-Visit. If it has been greater than 24 hours you will need to follow up with your provider, or enter a new e-Visit to address those concerns.  You will get an e-mail in the next two days asking about your experience.  I hope that your e-visit has been valuable and will speed your recovery. Thank you for using e-visits.   I spent 5-10 minutes on review and completion of this note- Oluwatobi Ruppe PAC  

## 2020-06-27 MED ORDER — FLUCONAZOLE 150 MG PO TABS
150.0000 mg | ORAL_TABLET | Freq: Once | ORAL | 0 refills | Status: AC
Start: 2020-06-27 — End: 2020-06-27

## 2020-07-07 ENCOUNTER — Telehealth: Payer: Self-pay

## 2020-07-07 ENCOUNTER — Telehealth: Payer: Self-pay | Admitting: Nurse Practitioner

## 2020-07-07 NOTE — Telephone Encounter (Signed)
I called Anna [name] to discuss Covid symptoms and the use of casirivimab/imdevimab, a monoclonal antibody infusion for those with mild to moderate Covid symptoms and at a high risk of hospitalization.    Pt is not qualified for this infusion due to lack of identified risk factors and co-morbid conditions.  Symptoms reviewed as well as criteria for ending isolation.  Symptoms reviewed that would warrant ED/Hospital evaluation as well should her condition worsen. Preventative practices reviewed. Patient verbalized understanding.   Murray Hodgkins, NP

## 2020-07-07 NOTE — Telephone Encounter (Signed)
Unable to reach pt by phone; left detailed message per clinical info tab on demographics for pt to call back to [phone] option 4 and if v/m please leave symptoms, who pt spoke with about infusion and has pt tested + for covid. Sending note to Butch Penny CMA and [facility] LPN.

## 2020-07-07 NOTE — Telephone Encounter (Addendum)
Anna [name] notified as instructed by telephone.  She states she did call the infusion clinic and they went through the questionnaire and patient did not qualify for the infusion.  She states she has not had any issues with asthma for years.

## 2020-07-07 NOTE — Telephone Encounter (Signed)
Can you call [place] and get a little more information.  I do no see where we have seen or spoke to her about an infusion.  Does she have Covid?

## 2020-07-07 NOTE — Telephone Encounter (Signed)
Pt left VM at triage. She said she tested positive on Covid on 06/29/20, she still has a cough, extreme fatigue, and body aches, no fever. Pt is still waiting the infusion if she qualifies

## 2020-07-07 NOTE — Telephone Encounter (Signed)
Let pt know: She qualifies with her history of moderate persistent asthma.  I made a referral for MAB but she has to have within 10 days of positive test... this may not be able to be done in time.  They will call her regarding it!

## 2020-07-07 NOTE — Telephone Encounter (Signed)
Pt left v/m wanting a cb to get status of infusion clinic appt.

## 2020-09-27 DIAGNOSIS — E890 Postprocedural hypothyroidism: Secondary | ICD-10-CM | POA: Diagnosis not present

## 2020-09-27 DIAGNOSIS — Z808 Family history of malignant neoplasm of other organs or systems: Secondary | ICD-10-CM | POA: Diagnosis not present

## 2020-09-29 DIAGNOSIS — K5790 Diverticulosis of intestine, part unspecified, without perforation or abscess without bleeding: Secondary | ICD-10-CM | POA: Diagnosis not present

## 2020-09-29 DIAGNOSIS — Z1211 Encounter for screening for malignant neoplasm of colon: Secondary | ICD-10-CM | POA: Diagnosis not present

## 2020-09-29 DIAGNOSIS — R1032 Left lower quadrant pain: Secondary | ICD-10-CM | POA: Diagnosis not present

## 2020-12-20 DIAGNOSIS — Z01812 Encounter for preprocedural laboratory examination: Secondary | ICD-10-CM | POA: Diagnosis not present

## 2020-12-25 DIAGNOSIS — K635 Polyp of colon: Secondary | ICD-10-CM | POA: Diagnosis not present

## 2020-12-25 DIAGNOSIS — Z1211 Encounter for screening for malignant neoplasm of colon: Secondary | ICD-10-CM | POA: Diagnosis not present

## 2020-12-25 DIAGNOSIS — K644 Residual hemorrhoidal skin tags: Secondary | ICD-10-CM | POA: Diagnosis not present

## 2020-12-25 DIAGNOSIS — K5730 Diverticulosis of large intestine without perforation or abscess without bleeding: Secondary | ICD-10-CM | POA: Diagnosis not present

## 2020-12-25 LAB — HM COLONOSCOPY

## 2020-12-27 ENCOUNTER — Other Ambulatory Visit: Payer: Self-pay | Admitting: Family Medicine

## 2020-12-28 NOTE — Telephone Encounter (Signed)
Spoke with patient scheduled CPE with fasting labs 

## 2020-12-28 NOTE — Telephone Encounter (Signed)
Please schedule CPE with fasting labs prior with Dr. Bedsole.  

## 2021-01-04 ENCOUNTER — Telehealth: Payer: Self-pay | Admitting: Family Medicine

## 2021-01-04 DIAGNOSIS — E039 Hypothyroidism, unspecified: Secondary | ICD-10-CM

## 2021-01-04 DIAGNOSIS — Z13220 Encounter for screening for lipoid disorders: Secondary | ICD-10-CM

## 2021-01-04 NOTE — Telephone Encounter (Signed)
-----   Message from Cloyd Stagers, RT sent at 01/01/2021  2:53 PM EST ----- Regarding: Lab Orders for Friday 3.25.2022 Please place lab orders for Friday 3.25.2022, office visit for physical on Friday 4.1.2022 Thank you, Dyke Maes RT(R)

## 2021-01-19 ENCOUNTER — Other Ambulatory Visit: Payer: 59

## 2021-01-19 ENCOUNTER — Other Ambulatory Visit (INDEPENDENT_AMBULATORY_CARE_PROVIDER_SITE_OTHER): Payer: BC Managed Care – PPO

## 2021-01-19 ENCOUNTER — Other Ambulatory Visit: Payer: Self-pay

## 2021-01-19 DIAGNOSIS — E039 Hypothyroidism, unspecified: Secondary | ICD-10-CM

## 2021-01-19 DIAGNOSIS — Z13220 Encounter for screening for lipoid disorders: Secondary | ICD-10-CM

## 2021-01-19 LAB — LIPID PANEL
Cholesterol: 195 mg/dL (ref 0–200)
HDL: 76.4 mg/dL (ref >39.00)
LDL Cholesterol: 109 mg/dL — ABNORMAL HIGH (ref 0–99)
NonHDL: 118.68
Total CHOL/HDL Ratio: 3
Triglycerides: 50 mg/dL (ref 0.0–149.0)
VLDL: 10 mg/dL (ref 0.0–40.0)

## 2021-01-19 LAB — COMPREHENSIVE METABOLIC PANEL
ALT: 13 U/L (ref 0–35)
AST: 18 U/L (ref 0–37)
Albumin: 4.4 g/dL (ref 3.5–5.2)
Alkaline Phosphatase: 52 U/L (ref 39–117)
BUN: 16 mg/dL (ref 6–23)
CO2: 28 mEq/L (ref 19–32)
Calcium: 9.2 mg/dL (ref 8.4–10.5)
Chloride: 101 mEq/L (ref 96–112)
Creatinine, Ser: 0.83 mg/dL (ref 0.40–1.20)
GFR: 83.57 mL/min (ref >60.00)
Glucose, Bld: 100 mg/dL — ABNORMAL HIGH (ref 70–99)
Potassium: 4.4 mEq/L (ref 3.5–5.1)
Sodium: 136 mEq/L (ref 135–145)
Total Bilirubin: 0.7 mg/dL (ref 0.2–1.2)
Total Protein: 6.7 g/dL (ref 6.0–8.3)

## 2021-01-19 LAB — T4, FREE: Free T4: 2.22 ng/dL — ABNORMAL HIGH (ref 0.60–1.60)

## 2021-01-19 LAB — TSH: TSH: 1.15 u[IU]/mL (ref 0.35–4.50)

## 2021-01-19 LAB — T3, FREE: T3, Free: 6.3 pg/mL — ABNORMAL HIGH (ref 2.3–4.2)

## 2021-01-19 NOTE — Progress Notes (Signed)
No critical labs need to be addressed urgently. We will discuss labs in detail at upcoming office visit.   

## 2021-01-26 ENCOUNTER — Encounter: Payer: Self-pay | Admitting: Family Medicine

## 2021-01-26 ENCOUNTER — Other Ambulatory Visit: Payer: Self-pay

## 2021-01-26 ENCOUNTER — Ambulatory Visit (INDEPENDENT_AMBULATORY_CARE_PROVIDER_SITE_OTHER): Payer: BC Managed Care – PPO | Admitting: Family Medicine

## 2021-01-26 VITALS — BP 102/74 | HR 70 | Temp 98.3°F | Ht 66.75 in | Wt 168.0 lb

## 2021-01-26 DIAGNOSIS — J4540 Moderate persistent asthma, uncomplicated: Secondary | ICD-10-CM

## 2021-01-26 DIAGNOSIS — E039 Hypothyroidism, unspecified: Secondary | ICD-10-CM

## 2021-01-26 DIAGNOSIS — Z0000 Encounter for general adult medical examination without abnormal findings: Secondary | ICD-10-CM

## 2021-01-26 DIAGNOSIS — J301 Allergic rhinitis due to pollen: Secondary | ICD-10-CM

## 2021-01-26 DIAGNOSIS — G43019 Migraine without aura, intractable, without status migrainosus: Secondary | ICD-10-CM

## 2021-01-26 MED ORDER — SUMATRIPTAN SUCCINATE 100 MG PO TABS: 100.0000 mg | ORAL_TABLET | ORAL | 1 refills | Status: DC | PRN

## 2021-01-26 NOTE — Assessment & Plan Note (Signed)
Restart flonase 

## 2021-01-26 NOTE — Assessment & Plan Note (Addendum)
Free t3 and t4 borderline elevated on levo 75 mg.. followed by ENDO.

## 2021-01-26 NOTE — Assessment & Plan Note (Signed)
Stable, chronic.  Continue current medication.    

## 2021-01-26 NOTE — Assessment & Plan Note (Signed)
Replax not helping for acutes .. takes a while before kicks in 1-2 hrs. Will try sumatriptan again.

## 2021-01-26 NOTE — Patient Instructions (Signed)
Work on exercise, weight loss, healthy eating habits.    Calorie Counting for Weight Loss Calories are units of energy. Your body needs a certain number of calories from food to keep going throughout the day. When you eat or drink more calories than your body needs, your body stores the extra calories mostly as fat. When you eat or drink fewer calories than your body needs, your body burns fat to get the energy it needs. Calorie counting means keeping track of how many calories you eat and drink each day. Calorie counting can be helpful if you need to lose weight. If you eat fewer calories than your body needs, you should lose weight. Ask your health care provider what a healthy weight is for you. For calorie counting to work, you will need to eat the right number of calories each day to lose a healthy amount of weight per week. A dietitian can help you figure out how many calories you need in a day and will suggest ways to reach your calorie goal.  A healthy amount of weight to lose each week is usually 1-2 lb (0.5-0.9 kg). This usually means that your daily calorie intake should be reduced by 500-750 calories.  Eating 1,200-1,500 calories a day can help most women lose weight.  Eating 1,500-1,800 calories a day can help most men lose weight. What do I need to know about calorie counting? Work with your health care provider or dietitian to determine how many calories you should get each day. To meet your daily calorie goal, you will need to:  Find out how many calories are in each food that you would like to eat. Try to do this before you eat.  Decide how much of the food you plan to eat.  Keep a food log. Do this by writing down what you ate and how many calories it had. To successfully lose weight, it is important to balance calorie counting with a healthy lifestyle that includes regular activity. Where do I find calorie information? The number of calories in a food can be found on a  Nutrition Facts label. If a food does not have a Nutrition Facts label, try to look up the calories online or ask your dietitian for help. Remember that calories are listed per serving. If you choose to have more than one serving of a food, you will have to multiply the calories per serving by the number of servings you plan to eat. For example, the label on a package of bread might say that a serving size is 1 slice and that there are 90 calories in a serving. If you eat 1 slice, you will have eaten 90 calories. If you eat 2 slices, you will have eaten 180 calories.   How do I keep a food log? After each time that you eat, record the following in your food log as soon as possible:  What you ate. Be sure to include toppings, sauces, and other extras on the food.  How much you ate. This can be measured in cups, ounces, or number of items.  How many calories were in each food and drink.  The total number of calories in the food you ate. Keep your food log near you, such as in a pocket-sized notebook or on an app or website on your mobile phone. Some programs will calculate calories for you and show you how many calories you have left to meet your daily goal. What are some portion-control tips?  Know how many calories are in a serving. This will help you know how many servings you can have of a certain food.  Use a measuring cup to measure serving sizes. You could also try weighing out portions on a kitchen scale. With time, you will be able to estimate serving sizes for some foods.  Take time to put servings of different foods on your favorite plates or in your favorite bowls and cups so you know what a serving looks like.  Try not to eat straight from a food's packaging, such as from a bag or box. Eating straight from the package makes it hard to see how much you are eating and can lead to overeating. Put the amount you would like to eat in a cup or on a plate to make sure you are eating the  right portion.  Use smaller plates, glasses, and bowls for smaller portions and to prevent overeating.  Try not to multitask. For example, avoid watching TV or using your computer while eating. If it is time to eat, sit down at a table and enjoy your food. This will help you recognize when you are full. It will also help you be more mindful of what and how much you are eating. What are tips for following this plan? Reading food labels  Check the calorie count compared with the serving size. The serving size may be smaller than what you are used to eating.  Check the source of the calories. Try to choose foods that are high in protein, fiber, and vitamins, and low in saturated fat, trans fat, and sodium. Shopping  Read nutrition labels while you shop. This will help you make healthy decisions about which foods to buy.  Pay attention to nutrition labels for low-fat or fat-free foods. These foods sometimes have the same number of calories or more calories than the full-fat versions. They also often have added sugar, starch, or salt to make up for flavor that was removed with the fat.  Make a grocery list of lower-calorie foods and stick to it. Cooking  Try to cook your favorite foods in a healthier way. For example, try baking instead of frying.  Use low-fat dairy products. Meal planning  Use more fruits and vegetables. One-half of your plate should be fruits and vegetables.  Include lean proteins, such as chicken, [place], and fish. Lifestyle Each week, aim to do one of the following:  150 minutes of moderate exercise, such as walking.  75 minutes of vigorous exercise, such as running. General information  Know how many calories are in the foods you eat most often. This will help you calculate calorie counts faster.  Find a way of tracking calories that works for you. Get creative. Try different apps or programs if writing down calories does not work for you. What foods should I  eat?  Eat nutritious foods. It is better to have a nutritious, high-calorie food, such as an avocado, than a food with few nutrients, such as a bag of potato chips.  Use your calories on foods and drinks that will fill you up and will not leave you hungry soon after eating. ? Examples of foods that fill you up are nuts and nut butters, vegetables, lean proteins, and high-fiber foods such as whole grains. High-fiber foods are foods with more than 5 g of fiber per serving.  Pay attention to calories in drinks. Low-calorie drinks include water and unsweetened drinks. The items listed above may not be  a complete list of foods and beverages you can eat. Contact a dietitian for more information.   What foods should I limit? Limit foods or drinks that are not good sources of vitamins, minerals, or protein or that are high in unhealthy fats. These include:  Candy.  Other sweets.  Sodas, specialty coffee drinks, alcohol, and juice. The items listed above may not be a complete list of foods and beverages you should avoid. Contact a dietitian for more information. How do I count calories when eating out?  Pay attention to portions. Often, portions are much larger when eating out. Try these tips to keep portions smaller: ? Consider sharing a meal instead of getting your own. ? If you get your own meal, eat only half of it. Before you start eating, ask for a container and put half of your meal into it. ? When available, consider ordering smaller portions from the menu instead of full portions.  Pay attention to your food and drink choices. Knowing the way food is cooked and what is included with the meal can help you eat fewer calories. ? If calories are listed on the menu, choose the lower-calorie options. ? Choose dishes that include vegetables, fruits, whole grains, low-fat dairy products, and lean proteins. ? Choose items that are boiled, broiled, grilled, or steamed. Avoid items that are  buttered, battered, fried, or served with cream sauce. Items labeled as crispy are usually fried, unless stated otherwise. ? Choose water, low-fat milk, unsweetened iced tea, or other drinks without added sugar. If you want an alcoholic beverage, choose a lower-calorie option, such as a glass of wine or light beer. ? Ask for dressings, sauces, and syrups on the side. These are usually high in calories, so you should limit the amount you eat. ? If you want a salad, choose a garden salad and ask for grilled meats. Avoid extra toppings such as bacon, cheese, or fried items. Ask for the dressing on the side, or ask for olive oil and vinegar or lemon to use as dressing.  Estimate how many servings of a food you are given. Knowing serving sizes will help you be aware of how much food you are eating at restaurants. Where to find more information  Centers for Disease Control and Prevention: http://www.wolf.info/  [place] Department of Agriculture: http://www.wilson-mendoza.org/ Summary  Calorie counting means keeping track of how many calories you eat and drink each day. If you eat fewer calories than your body needs, you should lose weight.  A healthy amount of weight to lose per week is usually 1-2 lb (0.5-0.9 kg). This usually means reducing your daily calorie intake by 500-750 calories.  The number of calories in a food can be found on a Nutrition Facts label. If a food does not have a Nutrition Facts label, try to look up the calories online or ask your dietitian for help.  Use smaller plates, glasses, and bowls for smaller portions and to prevent overeating.  Use your calories on foods and drinks that will fill you up and not leave you hungry shortly after a meal. This information is not intended to replace advice given to you by your health care provider. Make sure you discuss any questions you have with your health care provider. Document Revised: 11/25/2019 Document Reviewed: 11/25/2019 Elsevier Patient Education  2021  [employer].

## 2021-01-26 NOTE — Progress Notes (Signed)
Patient ID: Anna [name], female    DOB: [birth year], 50 y.o.   MRN: [id number]  This visit was conducted in person.  Temp 98.3 F (36.8 C) (Temporal)   Ht 5' 6.75" (1.695 m)   Wt 168 lb (76.2 kg)   BMI 26.51 kg/m   BP Readings from Last 3 Encounters:  01/26/21 102/74  11/08/19 92/68  05/18/19 96/72    CC:  Chief Complaint  Patient presents with  . Annual Exam    Subjective:   HPI: Anna [name] is a 50 y.o. female presenting on 01/26/2021 for Annual Exam  Asthma moderate persistent: Allergy treatment and albuterol prn.  Hypothyroidism  Slightly high levels t3/t4 control on levothyroxine 75 mcg daily. Lab Results  Component Value Date   TSH 1.15 01/19/2021    Migraine without Aura:   Seem to be happening 1-2 times per month. Using relpax prn.    Diet: Healthy  Exercise: She is sitting more, walking and gym 3-4 times a week.  Wt Readings from Last 3 Encounters:  01/26/21 168 lb (76.2 kg)  11/18/19 150 lb 8 oz (68.3 kg)  11/08/19 153 lb (69.4 kg)     Relevant past medical, surgical, family and social history reviewed and updated as indicated. Interim medical history since our last visit reviewed. Allergies and medications reviewed and updated. Outpatient Medications Prior to Visit  Medication Sig Dispense Refill  . acetaminophen (TYLENOL) 500 MG tablet Take 500 mg by mouth daily as needed for moderate pain or headache.    . albuterol (VENTOLIN HFA) 108 (90 Base) MCG/ACT inhaler Inhale 2 puffs into the lungs every 6 (six) hours as needed for wheezing. 16 g 0  . clotrimazole-betamethasone (LOTRISONE) cream Lotrisone 1 %-0.05 % topical cream  APPLY TO THE AFFECTED AND SURROUNDING AREAS OF SKIN BY TOPICAL ROUTE 2 TIMES PER DAY IN THE MORNING AND EVENING    . eletriptan (RELPAX) 40 MG tablet TAKE 1 TABLET (40 MG TOTAL) BY MOUTH AS NEEDED FOR MIGRAINE OR HEADACHE. MAY REPEAT IN 2 HOURS IF HEADACHE PERSISTS OR RECURS. 4 tablet 2  . levothyroxine (SYNTHROID) 75 MCG  tablet Take 75 mcg by mouth daily before breakfast.    . Meth-Hyo-M Bl-Na Phos-Ph Sal (URIBEL) 118 MG CAPS Uribel 118 mg-10 mg-40.8 mg-36 mg capsule  1 po tid x 5 days then prn    . Multiple Vitamin (MULTIVITAMIN) tablet Take 1 tablet by mouth daily.    . naproxen sodium (ALEVE) 220 MG tablet Take 220 mg by mouth daily as needed (pain).    [name] levothyroxine (SYNTHROID, LEVOTHROID) 50 MCG tablet Take 50 mcg by mouth daily before breakfast.    . doxycycline (VIBRA-TABS) 100 MG tablet Take 1 tablet (100 mg total) by mouth 2 (two) times daily. 20 tablet 0   No facility-administered medications prior to visit.     Per HPI unless specifically indicated in ROS section below Review of Systems  Constitutional: Negative for chills and fever.  HENT: Negative for congestion and ear pain.   Eyes: Negative for pain and redness.  Respiratory: Negative for cough and shortness of breath.   Cardiovascular: Negative for chest pain, palpitations and leg swelling.  Gastrointestinal: Negative for abdominal pain, blood in stool, constipation, diarrhea, nausea and vomiting.  Genitourinary: Negative for dysuria.  Musculoskeletal: Negative for myalgias.  Skin: Negative for rash.  Neurological: Negative for dizziness.  Psychiatric/Behavioral: The patient is not nervous/anxious.    Objective:  Temp 98.3 F (36.8 C) (Temporal)  Ht 5' 6.75" (1.695 m)   Wt 168 lb (76.2 kg)   BMI 26.51 kg/m   Wt Readings from Last 3 Encounters:  01/26/21 168 lb (76.2 kg)  11/18/19 150 lb 8 oz (68.3 kg)  11/08/19 153 lb (69.4 kg)      Physical Exam Constitutional:      General: She is not in acute distress.    Appearance: Normal appearance. She is well-developed. She is not ill-appearing or toxic-appearing.  HENT:     Head: Normocephalic.     Right Ear: Hearing, tympanic membrane, ear canal and external ear normal.     Left Ear: Hearing, tympanic membrane, ear canal and external ear normal.     Nose: Nose normal.  Eyes:      General: Lids are normal. Lids are everted, no foreign bodies appreciated.     Conjunctiva/sclera: Conjunctivae normal.     Pupils: Pupils are equal, round, and reactive to light.  Neck:     Thyroid: No thyroid mass or thyromegaly.     Vascular: No carotid bruit.     Trachea: Trachea normal.  Cardiovascular:     Rate and Rhythm: Normal rate and regular rhythm.     Heart sounds: Normal heart sounds, S1 normal and S2 normal. No murmur heard. No gallop.   Pulmonary:     Effort: Pulmonary effort is normal. No respiratory distress.     Breath sounds: Normal breath sounds. No wheezing, rhonchi or rales.  Abdominal:     General: Bowel sounds are normal. There is no distension or abdominal bruit.     Palpations: Abdomen is soft. There is no fluid wave or mass.     Tenderness: There is no abdominal tenderness. There is no guarding or rebound.     Hernia: No hernia is present.  Musculoskeletal:     Cervical back: Normal range of motion and neck supple.  Lymphadenopathy:     Cervical: No cervical adenopathy.  Skin:    General: Skin is warm and dry.     Findings: No rash.  Neurological:     Mental Status: She is alert.     Cranial Nerves: No cranial nerve deficit.     Sensory: No sensory deficit.  Psychiatric:        Mood and Affect: Mood is not anxious or depressed.        Speech: Speech normal.        Behavior: Behavior normal. Behavior is cooperative.        Judgment: Judgment normal.       Results for orders placed or performed in visit on 01/19/21  T3, free  Result Value Ref Range   T3, Free 6.3 (H) 2.3 - 4.2 pg/mL  T4, free  Result Value Ref Range   Free T4 2.22 (H) 0.60 - 1.60 ng/dL  TSH  Result Value Ref Range   TSH 1.15 0.35 - 4.50 uIU/mL  Comprehensive metabolic panel  Result Value Ref Range   Sodium 136 135 - 145 mEq/L   Potassium 4.4 3.5 - 5.1 mEq/L   Chloride 101 96 - 112 mEq/L   CO2 28 19 - 32 mEq/L   Glucose, Bld 100 (H) 70 - 99 mg/dL   BUN 16 6 - 23  mg/dL   Creatinine, Ser 0.83 0.40 - 1.20 mg/dL   Total Bilirubin 0.7 0.2 - 1.2 mg/dL   Alkaline Phosphatase 52 39 - 117 U/L   AST 18 0 - 37 U/L   ALT 13 0 -  35 U/L   Total Protein 6.7 6.0 - 8.3 g/dL   Albumin 4.4 3.5 - 5.2 g/dL   GFR 83.57 >60.00 mL/min   Calcium 9.2 8.4 - 10.5 mg/dL  Lipid panel  Result Value Ref Range   Cholesterol 195 0 - 200 mg/dL   Triglycerides 50.0 0.0 - 149.0 mg/dL   HDL 76.40 >39.00 mg/dL   VLDL 10.0 0.0 - 40.0 mg/dL   LDL Cholesterol 109 (H) 0 - 99 mg/dL   Total CHOL/HDL Ratio 3    NonHDL 118.68     This visit occurred during the SARS-CoV-2 public health emergency.  Safety protocols were in place, including screening questions prior to the visit, additional usage of staff PPE, and extensive cleaning of exam room while observing appropriate contact time as indicated for disinfecting solutions.   COVID 19 screen:  No recent travel or known exposure to COVID19 The patient denies respiratory symptoms of COVID 19 at this time. The importance of social distancing was discussed today.   Assessment and Plan   The patient's preventative maintenance and recommended screening tests for an annual wellness exam were reviewed in full today. Brought up to date unless services declined.  Counselled on the importance of diet, exercise, and its role in overall health and mortality. The patient's FH and SH was reviewed, including their home life, tobacco status, and drug and alcohol status.   Vaccines:Refused flu, COVID. Pap/DVE:, Last pap 2018, negativeFollowed by GYN, Dr. Cletis Media Mammo:11/2017 per GYN, repeat due Colon:no early family history neg 2011, 12/25/2020... done Smoking Status: none ETOH/ drug use:1-2 glasses of wine a week/none HIV screen:refused  hep C due  Problem List Items Addressed This Visit    Allergic rhinitis    Restart flonase      Asthma, moderate persistent (Chronic)    Stable, chronic.  Continue current medication.          Hypothyroidism    Free t3 and t4 borderline elevated on levo 75 mg.. followed by ENDO.      Relevant Medications   levothyroxine (SYNTHROID) 75 MCG tablet   Migraine without aura     Replax not helping for acutes .. takes a while before kicks in 1-2 hrs. Will try sumatriptan again.      Relevant Medications   SUMAtriptan (IMITREX) 100 MG tablet    Other Visit Diagnoses    Routine general medical examination at a health care facility    -  Primary       Eliezer Lofts, MD

## 2021-02-08 ENCOUNTER — Other Ambulatory Visit: Payer: Self-pay | Admitting: Family Medicine

## 2021-02-08 MED ORDER — HYDROCHLOROTHIAZIDE 25 MG PO TABS: 25.0000 mg | ORAL_TABLET | Freq: Every day | ORAL | 11 refills | Status: DC | PRN

## 2021-02-16 ENCOUNTER — Encounter: Payer: Self-pay | Admitting: Family Medicine

## 2021-02-23 ENCOUNTER — Other Ambulatory Visit (INDEPENDENT_AMBULATORY_CARE_PROVIDER_SITE_OTHER): Payer: BC Managed Care – PPO

## 2021-02-23 ENCOUNTER — Other Ambulatory Visit: Payer: Self-pay

## 2021-02-23 ENCOUNTER — Other Ambulatory Visit: Payer: Self-pay | Admitting: Family Medicine

## 2021-02-23 DIAGNOSIS — R5383 Other fatigue: Secondary | ICD-10-CM | POA: Diagnosis not present

## 2021-02-23 LAB — CBC WITH DIFFERENTIAL/PLATELET
Basophils Absolute: 0 10*3/uL (ref 0.0–0.1)
Basophils Relative: 0.2 % (ref 0.0–3.0)
Eosinophils Absolute: 0.3 10*3/uL (ref 0.0–0.7)
Eosinophils Relative: 4.9 % (ref 0.0–5.0)
HCT: 43 % (ref 36.0–46.0)
Hemoglobin: 14.7 g/dL (ref 12.0–15.0)
Lymphocytes Relative: 40.7 % (ref 12.0–46.0)
Lymphs Abs: 2.7 10*3/uL (ref 0.7–4.0)
MCHC: 34.2 g/dL (ref 30.0–36.0)
MCV: 93.1 fl (ref 78.0–100.0)
Monocytes Absolute: 0.7 10*3/uL (ref 0.1–1.0)
Monocytes Relative: 10.2 % (ref 3.0–12.0)
Neutro Abs: 2.9 10*3/uL (ref 1.4–7.7)
Neutrophils Relative %: 44 % (ref 43.0–77.0)
Platelets: 220 10*3/uL (ref 150.0–400.0)
RBC: 4.62 Mil/uL (ref 3.87–5.11)
RDW: 13.3 % (ref 11.5–15.5)
WBC: 6.7 10*3/uL (ref 4.0–10.5)

## 2021-02-23 LAB — COMPREHENSIVE METABOLIC PANEL
ALT: 15 U/L (ref 0–35)
AST: 18 U/L (ref 0–37)
Albumin: 4.5 g/dL (ref 3.5–5.2)
Alkaline Phosphatase: 65 U/L (ref 39–117)
BUN: 12 mg/dL (ref 6–23)
CO2: 30 mEq/L (ref 19–32)
Calcium: 9.9 mg/dL (ref 8.4–10.5)
Chloride: 101 mEq/L (ref 96–112)
Creatinine, Ser: 0.78 mg/dL (ref 0.40–1.20)
GFR: 89.97 mL/min (ref >60.00)
Glucose, Bld: 87 mg/dL (ref 70–99)
Potassium: 4.2 mEq/L (ref 3.5–5.1)
Sodium: 138 mEq/L (ref 135–145)
Total Bilirubin: 0.6 mg/dL (ref 0.2–1.2)
Total Protein: 7.2 g/dL (ref 6.0–8.3)

## 2021-02-23 LAB — TSH: TSH: 1.24 u[IU]/mL (ref 0.35–4.50)

## 2021-02-23 LAB — VITAMIN D 25 HYDROXY (VIT D DEFICIENCY, FRACTURES): VITD: 41.36 ng/mL (ref 30.00–100.00)

## 2021-02-23 LAB — VITAMIN B12: Vitamin B-12: 377 pg/mL (ref 211–911)

## 2021-02-27 DIAGNOSIS — N950 Postmenopausal bleeding: Secondary | ICD-10-CM | POA: Diagnosis not present

## 2021-02-27 DIAGNOSIS — Z01419 Encounter for gynecological examination (general) (routine) without abnormal findings: Secondary | ICD-10-CM | POA: Diagnosis not present

## 2021-02-27 DIAGNOSIS — Z1231 Encounter for screening mammogram for malignant neoplasm of breast: Secondary | ICD-10-CM | POA: Diagnosis not present

## 2021-02-27 DIAGNOSIS — L900 Lichen sclerosus et atrophicus: Secondary | ICD-10-CM | POA: Diagnosis not present

## 2021-02-27 DIAGNOSIS — E559 Vitamin D deficiency, unspecified: Secondary | ICD-10-CM | POA: Diagnosis not present

## 2021-03-16 DIAGNOSIS — D1721 Benign lipomatous neoplasm of skin and subcutaneous tissue of right arm: Secondary | ICD-10-CM | POA: Diagnosis not present

## 2021-03-16 DIAGNOSIS — B078 Other viral warts: Secondary | ICD-10-CM | POA: Diagnosis not present

## 2021-03-16 DIAGNOSIS — D2239 Melanocytic nevi of other parts of face: Secondary | ICD-10-CM | POA: Diagnosis not present

## 2021-03-16 DIAGNOSIS — D2261 Melanocytic nevi of right upper limb, including shoulder: Secondary | ICD-10-CM | POA: Diagnosis not present

## 2021-03-16 DIAGNOSIS — L821 Other seborrheic keratosis: Secondary | ICD-10-CM | POA: Diagnosis not present

## 2021-04-03 DIAGNOSIS — N950 Postmenopausal bleeding: Secondary | ICD-10-CM | POA: Diagnosis not present

## 2021-05-08 DIAGNOSIS — R293 Abnormal posture: Secondary | ICD-10-CM | POA: Diagnosis not present

## 2021-05-08 DIAGNOSIS — M542 Cervicalgia: Secondary | ICD-10-CM | POA: Diagnosis not present

## 2021-05-08 DIAGNOSIS — M791 Myalgia, unspecified site: Secondary | ICD-10-CM | POA: Diagnosis not present

## 2021-05-18 DIAGNOSIS — M791 Myalgia, unspecified site: Secondary | ICD-10-CM | POA: Diagnosis not present

## 2021-05-18 DIAGNOSIS — M542 Cervicalgia: Secondary | ICD-10-CM | POA: Diagnosis not present

## 2021-05-18 DIAGNOSIS — R293 Abnormal posture: Secondary | ICD-10-CM | POA: Diagnosis not present

## 2021-05-21 DIAGNOSIS — E890 Postprocedural hypothyroidism: Secondary | ICD-10-CM | POA: Diagnosis not present

## 2021-05-21 DIAGNOSIS — E041 Nontoxic single thyroid nodule: Secondary | ICD-10-CM | POA: Diagnosis not present

## 2021-05-25 DIAGNOSIS — M542 Cervicalgia: Secondary | ICD-10-CM | POA: Diagnosis not present

## 2021-05-25 DIAGNOSIS — M791 Myalgia, unspecified site: Secondary | ICD-10-CM | POA: Diagnosis not present

## 2021-05-25 DIAGNOSIS — R293 Abnormal posture: Secondary | ICD-10-CM | POA: Diagnosis not present

## 2021-09-18 DIAGNOSIS — E890 Postprocedural hypothyroidism: Secondary | ICD-10-CM | POA: Diagnosis not present

## 2021-09-27 DIAGNOSIS — E041 Nontoxic single thyroid nodule: Secondary | ICD-10-CM | POA: Diagnosis not present

## 2021-09-27 DIAGNOSIS — E890 Postprocedural hypothyroidism: Secondary | ICD-10-CM | POA: Diagnosis not present

## 2021-10-16 ENCOUNTER — Encounter: Payer: Self-pay | Admitting: Family Medicine

## 2021-10-16 DIAGNOSIS — Z91018 Allergy to other foods: Secondary | ICD-10-CM

## 2021-11-08 DIAGNOSIS — E890 Postprocedural hypothyroidism: Secondary | ICD-10-CM | POA: Diagnosis not present

## 2021-11-09 ENCOUNTER — Telehealth: Payer: BC Managed Care – PPO | Admitting: Physician Assistant

## 2021-11-09 DIAGNOSIS — J019 Acute sinusitis, unspecified: Secondary | ICD-10-CM | POA: Diagnosis not present

## 2021-11-09 DIAGNOSIS — B9689 Other specified bacterial agents as the cause of diseases classified elsewhere: Secondary | ICD-10-CM

## 2021-11-09 MED ORDER — FLUCONAZOLE 150 MG PO TABS: 150.0000 mg | ORAL_TABLET | Freq: Once | ORAL | 0 refills | Status: AC

## 2021-11-09 MED ORDER — DOXYCYCLINE HYCLATE 100 MG PO TABS: 100.0000 mg | ORAL_TABLET | Freq: Two times a day (BID) | ORAL | 0 refills | Status: DC

## 2021-11-09 NOTE — Progress Notes (Signed)

## 2021-11-09 NOTE — Addendum Note (Signed)
Addended by: Mar Daring on: 11/09/2021 11:35 AM   Modules accepted: Orders

## 2021-11-13 ENCOUNTER — Encounter: Payer: Self-pay | Admitting: Family Medicine

## 2021-11-13 DIAGNOSIS — D2362 Other benign neoplasm of skin of left upper limb, including shoulder: Secondary | ICD-10-CM | POA: Diagnosis not present

## 2021-11-13 DIAGNOSIS — L570 Actinic keratosis: Secondary | ICD-10-CM | POA: Diagnosis not present

## 2021-11-13 DIAGNOSIS — L821 Other seborrheic keratosis: Secondary | ICD-10-CM | POA: Diagnosis not present

## 2021-11-13 DIAGNOSIS — M6740 Ganglion, unspecified site: Secondary | ICD-10-CM | POA: Diagnosis not present

## 2021-12-06 ENCOUNTER — Telehealth: Payer: BC Managed Care – PPO | Admitting: Physician Assistant

## 2021-12-06 DIAGNOSIS — R3989 Other symptoms and signs involving the genitourinary system: Secondary | ICD-10-CM | POA: Diagnosis not present

## 2021-12-06 MED ORDER — NITROFURANTOIN MONOHYD MACRO 100 MG PO CAPS: 100.0000 mg | ORAL_CAPSULE | Freq: Two times a day (BID) | ORAL | 0 refills | Status: DC

## 2021-12-06 NOTE — Progress Notes (Signed)

## 2021-12-20 MED ORDER — FLUCONAZOLE 150 MG PO TABS: 150.0000 mg | ORAL_TABLET | Freq: Once | ORAL | 0 refills | Status: AC

## 2021-12-20 NOTE — Addendum Note (Signed)
Addended by: Chevis Pretty on: 12/20/2021 02:48 PM   Modules accepted: Orders

## 2022-01-03 DIAGNOSIS — E890 Postprocedural hypothyroidism: Secondary | ICD-10-CM | POA: Diagnosis not present

## 2022-01-13 ENCOUNTER — Encounter: Payer: Self-pay | Admitting: Family Medicine

## 2022-03-05 DIAGNOSIS — Z01419 Encounter for gynecological examination (general) (routine) without abnormal findings: Secondary | ICD-10-CM | POA: Diagnosis not present

## 2022-03-05 DIAGNOSIS — L900 Lichen sclerosus et atrophicus: Secondary | ICD-10-CM | POA: Diagnosis not present

## 2022-03-05 DIAGNOSIS — Z1211 Encounter for screening for malignant neoplasm of colon: Secondary | ICD-10-CM | POA: Diagnosis not present

## 2022-03-05 DIAGNOSIS — Z1231 Encounter for screening mammogram for malignant neoplasm of breast: Secondary | ICD-10-CM | POA: Diagnosis not present

## 2022-03-05 LAB — HM MAMMOGRAPHY

## 2022-03-07 ENCOUNTER — Other Ambulatory Visit: Payer: Self-pay | Admitting: Obstetrics and Gynecology

## 2022-03-08 DIAGNOSIS — Z139 Encounter for screening, unspecified: Secondary | ICD-10-CM | POA: Diagnosis not present

## 2022-03-09 LAB — LAB REPORT - SCANNED
A1c: 5.3
EGFR: 72

## 2022-03-13 ENCOUNTER — Other Ambulatory Visit: Payer: Self-pay | Admitting: Obstetrics and Gynecology

## 2022-03-13 DIAGNOSIS — R928 Other abnormal and inconclusive findings on diagnostic imaging of breast: Secondary | ICD-10-CM

## 2022-03-15 DIAGNOSIS — E039 Hypothyroidism, unspecified: Secondary | ICD-10-CM | POA: Diagnosis not present

## 2022-03-15 DIAGNOSIS — R194 Change in bowel habit: Secondary | ICD-10-CM | POA: Diagnosis not present

## 2022-03-15 DIAGNOSIS — Z713 Dietary counseling and surveillance: Secondary | ICD-10-CM | POA: Diagnosis not present

## 2022-04-23 DIAGNOSIS — L814 Other melanin hyperpigmentation: Secondary | ICD-10-CM | POA: Diagnosis not present

## 2022-04-23 DIAGNOSIS — D224 Melanocytic nevi of scalp and neck: Secondary | ICD-10-CM | POA: Diagnosis not present

## 2022-04-23 DIAGNOSIS — D225 Melanocytic nevi of trunk: Secondary | ICD-10-CM | POA: Diagnosis not present

## 2022-04-23 DIAGNOSIS — D1801 Hemangioma of skin and subcutaneous tissue: Secondary | ICD-10-CM | POA: Diagnosis not present

## 2022-04-23 DIAGNOSIS — L821 Other seborrheic keratosis: Secondary | ICD-10-CM | POA: Diagnosis not present

## 2022-04-25 DIAGNOSIS — J309 Allergic rhinitis, unspecified: Secondary | ICD-10-CM | POA: Diagnosis not present

## 2022-04-25 DIAGNOSIS — J029 Acute pharyngitis, unspecified: Secondary | ICD-10-CM | POA: Diagnosis not present

## 2022-05-24 ENCOUNTER — Telehealth: Payer: BC Managed Care – PPO | Admitting: Physician Assistant

## 2022-05-24 DIAGNOSIS — R3989 Other symptoms and signs involving the genitourinary system: Secondary | ICD-10-CM | POA: Diagnosis not present

## 2022-05-24 MED ORDER — NITROFURANTOIN MONOHYD MACRO 100 MG PO CAPS: 100.0000 mg | ORAL_CAPSULE | Freq: Two times a day (BID) | ORAL | 0 refills | Status: DC

## 2022-05-24 NOTE — Progress Notes (Signed)
E-Visit for Urinary Problems  We are sorry that you are not feeling well.  Here is how we plan to help!  Based on what you shared with me it looks like you most likely have a simple urinary tract infection.  A UTI (Urinary Tract Infection) is a bacterial infection of the bladder.  Most cases of urinary tract infections are simple to treat but a key part of your care is to encourage you to drink plenty of fluids and watch your symptoms carefully.  I have prescribed MacroBid 100 mg twice a day for 5 days.  Your symptoms should gradually improve. Call us if the burning in your urine worsens, you develop worsening fever, back pain or pelvic pain or if your symptoms do not resolve after completing the antibiotic.  Urinary tract infections can be prevented by drinking plenty of water to keep your body hydrated.  Also be sure when you wipe, wipe from front to back and don't hold it in!  If possible, empty your bladder every 4 hours.  HOME CARE Drink plenty of fluids Compete the full course of the antibiotics even if the symptoms resolve Remember, when you need to go.go. Holding in your urine can increase the likelihood of getting a UTI! GET HELP RIGHT AWAY IF: You cannot urinate You get a high fever Worsening back pain occurs You see blood in your urine You feel sick to your stomach or throw up You feel like you are going to pass out  MAKE SURE YOU  Understand these instructions. Will watch your condition. Will get help right away if you are not doing well or get worse.   Thank you for choosing an e-visit.  Your e-visit answers were reviewed by a board certified advanced clinical practitioner to complete your personal care plan. Depending upon the condition, your plan could have included both over the counter or prescription medications.  Please review your pharmacy choice. Make sure the pharmacy is open so you can pick up prescription now. If there is a problem, you may contact your  provider through [employer] and have the prescription routed to another pharmacy.  Your safety is important to [place]. If you have drug allergies check your prescription carefully.   For the next 24 hours you can use MyChart to ask questions about today's visit, request a non-urgent call back, or ask for a work or school excuse. You will get an email in the next two days asking about your experience. I hope that your e-visit has been valuable and will speed your recovery.  I provided 5 minutes of non face-to-face time during this encounter for chart review and documentation.

## 2022-06-19 DIAGNOSIS — E890 Postprocedural hypothyroidism: Secondary | ICD-10-CM | POA: Diagnosis not present

## 2022-06-27 ENCOUNTER — Encounter: Payer: Self-pay | Admitting: Family Medicine

## 2022-06-27 ENCOUNTER — Telehealth: Payer: BC Managed Care – PPO | Admitting: Physician Assistant

## 2022-06-27 ENCOUNTER — Ambulatory Visit: Payer: BC Managed Care – PPO | Admitting: Family Medicine

## 2022-06-27 VITALS — BP 98/70 | HR 57 | Temp 98.4°F | Ht 66.5 in | Wt 173.5 lb

## 2022-06-27 DIAGNOSIS — R300 Dysuria: Secondary | ICD-10-CM

## 2022-06-27 DIAGNOSIS — N390 Urinary tract infection, site not specified: Secondary | ICD-10-CM

## 2022-06-27 LAB — POC URINALSYSI DIPSTICK (AUTOMATED)
Bilirubin, UA: NEGATIVE
Blood, UA: NEGATIVE
Glucose, UA: NEGATIVE
Ketones, UA: NEGATIVE
Leukocytes, UA: NEGATIVE
Nitrite, UA: NEGATIVE
Protein, UA: NEGATIVE
Spec Grav, UA: <=1.005 — AB (ref 1.010–1.025)
Urobilinogen, UA: 0.2 E.U./dL
pH, UA: 7 (ref 5.0–8.0)

## 2022-06-27 MED ORDER — URIBEL 118 MG PO CAPS: ORAL_CAPSULE | ORAL | 0 refills | Status: AC

## 2022-06-27 NOTE — Progress Notes (Signed)
Because of persistent/recurrent symptoms after antibiotic treatment, I feel your condition warrants further evaluation and I recommend that you be seen for a face to face visit.  Please contact your primary care physician practice to be seen. Many offices offer virtual options to be seen via video if you are not comfortable going in person to a medical facility at this time. You need a urinalysis and urine culture to help determine the best choice of antibiotic to fully resolve this infection.   NOTE: You will NOT be charged for this eVisit.  If you do not have a PCP, [facility] offers a free physician referral service available at [phone]. Our trained staff has the experience, knowledge and resources to put you in touch with a physician who is right for you.    If you are having a true medical emergency please call 911.   Your e-visit answers were reviewed by a board certified advanced clinical practitioner to complete your personal care plan.  Thank you for using e-Visits.

## 2022-06-27 NOTE — Progress Notes (Signed)
Patient ID: Anna [name], female    DOB: 01/30/1971, 51 y.o.   MRN: [id number]  This visit was conducted in person.  BP 98/70   Pulse (!) 57   Temp 98.4 F (36.9 C) (Oral)   Ht 5' 6.5" (1.689 m)   Wt 173 lb 8 oz (78.7 kg)   LMP 09/07/2019   SpO2 98%   BMI 27.58 kg/m    CC:  Chief Complaint  Patient presents with   Urinary Frequency   Burning with Urination    Subjective:   HPI: Anna [name] is a 51 y.o. female presenting on 06/27/2022 for Urinary Frequency and Burning with Urination    Evisit UTI in 7/25.[name] Dx with UTI, but no culutre.. treated with macrobid.  dysuria: yes  Increase in odor and increase frequency/ urgency duration of symptoms: intermittent for 2 months abdominal pain: mild fevers:none back pain: none vomiting:none other concerns: improved for few days with azo  No vaginal discharge.  Has appt with URO sept 20th.  UA in office today.. no LE, no rbc, very dilute     IN past seen by URO.Marland Kitchenafter continued urinary irriation after UTI.[name] still using Uribel.  Relevant past medical, surgical, family and social history reviewed and updated as indicated. Interim medical history since our last visit reviewed. Allergies and medications reviewed and updated. Outpatient Medications Prior to Visit  Medication Sig Dispense Refill   albuterol (VENTOLIN HFA) 108 (90 Base) MCG/ACT inhaler Inhale 2 puffs into the lungs every 6 (six) hours as needed for wheezing. 16 g 0   clotrimazole-betamethasone (LOTRISONE) cream Lotrisone 1 %-0.05 % topical cream  APPLY TO THE AFFECTED AND SURROUNDING AREAS OF SKIN BY TOPICAL ROUTE 2 TIMES PER DAY IN THE MORNING AND EVENING     hydrochlorothiazide (HYDRODIURIL) 25 MG tablet Take 1 tablet (25 mg total) by mouth daily as needed (swelling). 30 tablet 11   levothyroxine (SYNTHROID) 75 MCG tablet Take 75 mcg by mouth daily before breakfast.     Multiple Vitamin (MULTIVITAMIN) tablet Take 1 tablet by mouth daily.     SUMAtriptan  (IMITREX) 100 MG tablet Take 1 tablet (100 mg total) by mouth every 2 (two) hours as needed for migraine. May repeat in 2 hours if headache persists or recurs. 10 tablet 1   Meth-Hyo-M Bl-Na Phos-Ph Sal (URIBEL) 118 MG CAPS Uribel 118 mg-10 mg-40.8 mg-36 mg capsule  1 po tid x 5 days then prn (Patient not taking: Reported on 06/27/2022)     acetaminophen (TYLENOL) 500 MG tablet Take 500 mg by mouth daily as needed for moderate pain or headache.     naproxen sodium (ALEVE) 220 MG tablet Take 220 mg by mouth daily as needed (pain).     nitrofurantoin, macrocrystal-monohydrate, (MACROBID) 100 MG capsule Take 1 capsule (100 mg total) by mouth 2 (two) times daily. 10 capsule 0   No facility-administered medications prior to visit.     Per HPI unless specifically indicated in ROS section below Review of Systems  Constitutional:  Negative for fatigue and fever.  HENT:  Negative for congestion.   Eyes:  Negative for pain.  Respiratory:  Negative for cough and shortness of breath.   Cardiovascular:  Negative for chest pain, palpitations and leg swelling.  Gastrointestinal:  Negative for abdominal pain.  Genitourinary:  Negative for dysuria and vaginal bleeding.  Musculoskeletal:  Negative for back pain.  Neurological:  Negative for syncope, light-headedness and headaches.  Psychiatric/Behavioral:  Negative for dysphoric mood.  Objective:  BP 98/70   Pulse (!) 57   Temp 98.4 F (36.9 C) (Oral)   Ht 5' 6.5" (1.689 m)   Wt 173 lb 8 oz (78.7 kg)   LMP 09/07/2019   SpO2 98%   BMI 27.58 kg/m   Wt Readings from Last 3 Encounters:  06/27/22 173 lb 8 oz (78.7 kg)  01/26/21 168 lb (76.2 kg)  11/18/19 150 lb 8 oz (68.3 kg)      Physical Exam Constitutional:      General: She is not in acute distress.    Appearance: Normal appearance. She is well-developed. She is not ill-appearing or toxic-appearing.  HENT:     Head: Normocephalic.     Right Ear: Hearing, tympanic membrane, ear canal and  external ear normal. Tympanic membrane is not erythematous, retracted or bulging.     Left Ear: Hearing, tympanic membrane, ear canal and external ear normal. Tympanic membrane is not erythematous, retracted or bulging.     Nose: No mucosal edema or rhinorrhea.     Right Sinus: No maxillary sinus tenderness or frontal sinus tenderness.     Left Sinus: No maxillary sinus tenderness or frontal sinus tenderness.     Mouth/Throat:     Pharynx: Uvula midline.  Eyes:     General: Lids are normal. Lids are everted, no foreign bodies appreciated.     Conjunctiva/sclera: Conjunctivae normal.     Pupils: Pupils are equal, round, and reactive to light.  Neck:     Thyroid: No thyroid mass or thyromegaly.     Vascular: No carotid bruit.     Trachea: Trachea normal.  Cardiovascular:     Rate and Rhythm: Normal rate and regular rhythm.     Pulses: Normal pulses.     Heart sounds: Normal heart sounds, S1 normal and S2 normal. No murmur heard.    No friction rub. No gallop.  Pulmonary:     Effort: Pulmonary effort is normal. No tachypnea or respiratory distress.     Breath sounds: Normal breath sounds. No decreased breath sounds, wheezing, rhonchi or rales.  Abdominal:     General: Bowel sounds are normal.     Palpations: Abdomen is soft.     Tenderness: There is no abdominal tenderness.  Musculoskeletal:     Cervical back: Normal range of motion and neck supple.  Skin:    General: Skin is warm and dry.     Findings: No rash.  Neurological:     Mental Status: She is alert.  Psychiatric:        Mood and Affect: Mood is not anxious or depressed.        Speech: Speech normal.        Behavior: Behavior normal. Behavior is cooperative.        Thought Content: Thought content normal.        Judgment: Judgment normal.       Results for orders placed or performed in visit on 06/27/22  POCT Urinalysis Dipstick (Automated)  Result Value Ref Range   Color, UA Yellow    Clarity, UA Clear     Glucose, UA Negative Negative   Bilirubin, UA Negative    Ketones, UA Negative    Spec Grav, UA <=1.005 (A) 1.010 - 1.025   Blood, UA Negative    pH, UA 7.0 5.0 - 8.0   Protein, UA Negative Negative   Urobilinogen, UA 0.2 0.2 or 1.0 E.U./dL   Nitrite, UA Negative    Leukocytes,  UA Negative Negative     COVID 19 screen:  No recent travel or known exposure to COVID19 The patient denies respiratory symptoms of COVID 19 at this time. The importance of social distancing was discussed today.   Assessment and Plan    Problem List Items Addressed This Visit     Burning with urination - Primary    Acute  No suggestion of infection per urinalysis.  She had similar urinary irritation after a treated urinary tract infection few years ago.  She was treated with Uribel at that time by urology.  I will send the urine for culture to assure that there is no urinary infection.  She will restart Uribel, increase water, and decrease bladder irritants such as alcohol, caffeine, tomato, citrus etc.      Relevant Orders   POCT Urinalysis Dipstick (Automated) (Completed)   Urine Culture   Meds ordered this encounter  Medications   Meth-Hyo-M Bl-Na Phos-Ph Sal (URIBEL) 118 MG CAPS    Sig: Uribel 118 mg-10 mg-40.8 mg-36 mg capsule  1 po tid x 5 days then prn    Dispense:  30 capsule    Refill:  0   Orders Placed This Encounter  Procedures   Urine Culture   POCT Urinalysis Dipstick (Automated)      Eliezer Lofts, MD

## 2022-06-27 NOTE — Patient Instructions (Addendum)
Avoid bladder irritants such as ibuprofen, tomatos etc.  Keep up water intake.  Keep appt with urology.  Can try Uribel to soothe bladder.  We will call with urine culture results.

## 2022-06-28 DIAGNOSIS — R300 Dysuria: Secondary | ICD-10-CM | POA: Insufficient documentation

## 2022-06-28 LAB — URINE CULTURE
MICRO NUMBER:: 13858616
Result:: NO GROWTH
SPECIMEN QUALITY:: ADEQUATE

## 2022-06-28 NOTE — Assessment & Plan Note (Addendum)
Acute  No suggestion of infection per urinalysis.  She had similar urinary irritation after a treated urinary tract infection few years ago.  She was treated with Uribel at that time by urology.  I will send the urine for culture to assure that there is no urinary infection.  She will restart Uribel, increase water, and decrease bladder irritants such as alcohol, caffeine, tomato, citrus etc.  She will keep the follow-up appointment with urology on September 20 as planned.

## 2022-07-05 ENCOUNTER — Ambulatory Visit: Payer: BC Managed Care – PPO | Admitting: Family Medicine

## 2022-07-05 ENCOUNTER — Telehealth: Payer: Self-pay | Admitting: Family Medicine

## 2022-07-05 NOTE — Telephone Encounter (Signed)
[facility] - Client TELEPHONE ADVICE RECORD [facility] Patient Name: Anna [name] [facility] Gender: Female DOB: 04/22/1971 Age: 51 Y 2 M 13 D Return Phone Number: [phone] (Primary) Address: City/ State/ Zip: [place]  [address] Client [facility] - Client Client Site [place] - Day Provider Eliezer Lofts - MD Contact Type Call Who Is Calling Patient / Member / Family / Caregiver Call Type Triage / Clinical Relationship To Patient Self Return Phone Number [phone] (Primary) Chief Complaint Facial Swelling Reason for Call Symptomatic / Request for Blessing states she has facial swelling and left eye swelling. Translation No Nurse Assessment Nurse: Thad Ranger, RN, Denise Date/Time (Eastern Time): 07/05/2022 10:57:53 AM Confirm and document reason for call. If symptomatic, describe symptoms. ---Caller states she has facial swelling and left eye swelling from a sty. Does the patient have any new or worsening symptoms? ---Yes Will a triage be completed? ---Yes Related visit to physician within the last 2 weeks? ---No Does the PT have any chronic conditions? (i.e. diabetes, asthma, this includes High risk factors for pregnancy, etc.) ---No Is the patient pregnant or possibly pregnant? (Ask all females between the ages of 34-55) ---No Is this a behavioral health or substance abuse call? ---No Guidelines Guideline Title Affirmed Question Affirmed Notes Nurse Date/Time (Eastern Time) Sty [1] Eyelid is very swollen AND [2] no fever Carmon, RN, Denise 07/05/2022 10:58:45 AM Disp. Time Eilene Ghazi Time) Disposition Final User 07/05/2022 11:00:20 AM See PCP within 24 Hours Yes Carmon, RN, Langley Gauss Final Disposition 07/05/2022 11:00:20 AM See PCP within 24 Hours Yes Carmon, RN, Langley Gauss PLEASE NOTE: All timestamps contained within this report are represented as [place] Standard  Time. CONFIDENTIALTY NOTICE: This fax transmission is intended only for the addressee. It contains information that is legally privileged, confidential or otherwise protected from use or disclosure. If you are not the intended recipient, you are strictly prohibited from reviewing, disclosing, copying using or disseminating any of this information or taking any action in reliance on or regarding this information. If you have received this fax in error, please notify us immediately by telephone so that we can arrange for its return to [place]. Phone: [phone], Toll-Free: [phone], Fax: [phone] Page: 2 of 2 Call Id: [id number] [place] Disagree/Comply Comply Caller Understands Yes PreDisposition Call Doctor Care Advice Given Per Guideline SEE PCP WITHIN 24 HOURS: LOCAL HEAT: * Apply a warm, wet washcloth to the eye for 10 minutes 4 times a day to help the sty come to a head. * Continue to cleanse the eye with warm water several times a day even after the sty begins to drain. CALL BACK IF: * You become worse CARE ADVICE given per Sty (Adult) guideline. Referrals REFERRED TO PCP OFFIC

## 2022-07-05 NOTE — Telephone Encounter (Signed)
Patient called and stated she has a swollen left eye and face is swollen as well. Patient was sent to access nurse.

## 2022-07-05 NOTE — Telephone Encounter (Signed)
Pt had appt with Dr Glori Bickers but pt cancelled appt with reason Patient (her eye doctor called her back and called in something for her. Sending note to Dr Glori Bickers and Dr Diona Browner as PCP as Juluis Rainier.

## 2022-07-19 DIAGNOSIS — R350 Frequency of micturition: Secondary | ICD-10-CM | POA: Diagnosis not present

## 2022-07-19 DIAGNOSIS — R8271 Bacteriuria: Secondary | ICD-10-CM | POA: Diagnosis not present

## 2022-07-22 ENCOUNTER — Telehealth: Payer: Self-pay

## 2022-07-22 NOTE — Telephone Encounter (Signed)
[place] Day - Client TELEPHONE ADVICE RECORD [facility] Patient Name: [facility] Gender: Female DOB: Sep 13, 1971 Age: 51 Y 27 M 30 D Return Phone Number: [phone] (Primary) Address: City/ State/ Zip: [place]  [address] Client [facility] Day - Client Client Site [place] - Day Provider Eliezer Lofts - MD Contact Type Call Who Is Calling Patient / Member / Family / Caregiver Call Type Triage / Clinical Relationship To Patient Self Return Phone Number [phone] (Primary) Chief Complaint Facial Swelling Reason for Call Symptomatic / Request for Erda states she thinks she having an allergic reaction to antibiotics and she is having itching and swelling on her face. Translation No Nurse Assessment Nurse: Hassell Done, RN, Melanie Date/Time (Eastern Time): 07/22/2022 2:11:09 PM Confirm and document reason for call. If symptomatic, describe symptoms. ---Caller has a sty in her eye and was put on doxycycline and her face is swollen and feels itchy. she has taken ABX since Friday. Does the patient have any new or worsening symptoms? ---Yes Will a triage be completed? ---Yes Related visit to physician within the last 2 weeks? ---No Does the PT have any chronic conditions? (i.e. diabetes, asthma, this includes High risk factors for pregnancy, etc.) ---No Is the patient pregnant or possibly pregnant? (Ask all females between the ages of 25-55) ---No Is this a behavioral health or substance abuse call? ---No Guidelines Guideline Title Affirmed Question Affirmed Notes Nurse Date/Time Eilene Ghazi Time) Face Swelling Face swelling began after taking a drug Hassell Done, RN, Threasa Beards 07/22/2022 2:13:05 PM Disp. Time Eilene Ghazi Time) Disposition Final User 07/22/2022 2:16:31 PM See HCP within 4 Hours (or PCP triage) Yes Hassell Done, RN, Melanie PLEASE NOTE: All timestamps  contained within this report are represented as [place] Standard Time. CONFIDENTIALTY NOTICE: This fax transmission is intended only for the addressee. It contains information that is legally privileged, confidential or otherwise protected from use or disclosure. If you are not the intended recipient, you are strictly prohibited from reviewing, disclosing, copying using or disseminating any of this information or taking any action in reliance on or regarding this information. If you have received this fax in error, please notify us immediately by telephone so that we can arrange for its return to [place]. Phone: [phone], Toll-Free: [phone], Fax: [phone] Page: 2 of 2 Call Id: [id number] Final Disposition 07/22/2022 2:16:31 PM See HCP within 4 Hours (or PCP triage) Yes Hassell Done, RN, Donnajean Lopes Disagree/Comply Comply Caller Understands Yes PreDisposition Call Doctor Care Advice Given Per Guideline SEE HCP (OR PCP TRIAGE) WITHIN 4 HOURS: * IF OFFICE WILL BE OPEN: You need to be seen within the next 3 or 4 hours. Call your doctor (or NP/PA) now or as soon as the office opens. ANTIHISTAMINE MEDICINES FOR ITCHING OR SWELLING: * CETIRIZINE (REACTINE, ZYRTEC): The adult dose is 10 mg. You take it once a day. Cetirizine is available in the [place] as Zyrtec and in [place] as Reactine. * LORATADINE (ALAVERT, CLARITIN): The adult dose is 10 mg. You take it once a day. Loratadine is available in the [place] as Engineer, maintenance (IT); it is available in [place] as Claritin. CALL BACK IF: * You become worse CARE ADVICE given per Face Swelling (Adult) guideline. Referrals GO TO FACILITY UNDECIDE

## 2022-07-22 NOTE — Telephone Encounter (Signed)
I spoke with Anna [name]; Anna [name] received abx from eye dr, Dr [place] on 07/19/22 and Anna [name] said starting on 07/21/22 noticed swelling under eye and jaw on side that Anna [name] has stye. Anna [name] said today some itching and tingling in jaw area. Anna [name] is not sure if allergic to med or coming from the stye. Anna [name] is not having any difficulty breathing and no swelling in neck,throat or tongue. Anna [name] said slight swell on lip where jaw is swollen. Anna [name] took abx this morning but will not take abx tonight until sees Dr Silvio Pate on 09/26/23at 10:45. UC & ED precautions given and Anna [name] voiced understanding. Anna [name] said she did with speak with Dr [place] office but Anna [name] was advised to see PCP.Sending note to Dr Silvio Pate and Larene Beach CMA.

## 2022-07-22 NOTE — Telephone Encounter (Signed)
It sounds best to hold off on the medication---a stye usually won't cause that swelling (unless secondary infection that needs oral antibiotics) Will assess at the visit tomorrow

## 2022-07-23 ENCOUNTER — Ambulatory Visit: Payer: BC Managed Care – PPO | Admitting: Internal Medicine

## 2022-08-05 DIAGNOSIS — N130 Hydronephrosis with ureteropelvic junction obstruction: Secondary | ICD-10-CM | POA: Diagnosis not present

## 2022-08-05 DIAGNOSIS — R300 Dysuria: Secondary | ICD-10-CM | POA: Diagnosis not present

## 2022-08-13 ENCOUNTER — Ambulatory Visit: Payer: BC Managed Care – PPO | Admitting: Family Medicine

## 2022-08-14 DIAGNOSIS — M25572 Pain in left ankle and joints of left foot: Secondary | ICD-10-CM | POA: Diagnosis not present

## 2022-08-14 DIAGNOSIS — M7742 Metatarsalgia, left foot: Secondary | ICD-10-CM | POA: Diagnosis not present

## 2022-09-30 DIAGNOSIS — E890 Postprocedural hypothyroidism: Secondary | ICD-10-CM | POA: Diagnosis not present

## 2022-10-07 DIAGNOSIS — E890 Postprocedural hypothyroidism: Secondary | ICD-10-CM | POA: Diagnosis not present

## 2022-10-07 DIAGNOSIS — E041 Nontoxic single thyroid nodule: Secondary | ICD-10-CM | POA: Diagnosis not present

## 2022-11-15 ENCOUNTER — Other Ambulatory Visit: Payer: Self-pay | Admitting: Obstetrics and Gynecology

## 2022-11-15 DIAGNOSIS — R928 Other abnormal and inconclusive findings on diagnostic imaging of breast: Secondary | ICD-10-CM

## 2022-11-25 ENCOUNTER — Telehealth: Payer: Self-pay | Admitting: Family Medicine

## 2022-11-25 DIAGNOSIS — Z13220 Encounter for screening for lipoid disorders: Secondary | ICD-10-CM

## 2022-11-25 DIAGNOSIS — E039 Hypothyroidism, unspecified: Secondary | ICD-10-CM

## 2022-11-25 NOTE — Telephone Encounter (Signed)
-----  Message from Velna Hatchet, RT sent at 11/15/2022  4:20 PM EST ----- Regarding: Wed 1/31 lab Patient is scheduled for cpx, please order future labs.  Thanks, Anda Kraft

## 2022-11-27 ENCOUNTER — Other Ambulatory Visit (INDEPENDENT_AMBULATORY_CARE_PROVIDER_SITE_OTHER): Payer: BC Managed Care – PPO

## 2022-11-27 DIAGNOSIS — Z13220 Encounter for screening for lipoid disorders: Secondary | ICD-10-CM

## 2022-11-27 DIAGNOSIS — E039 Hypothyroidism, unspecified: Secondary | ICD-10-CM

## 2022-11-27 LAB — COMPREHENSIVE METABOLIC PANEL
ALT: 22 U/L (ref 0–35)
AST: 31 U/L (ref 0–37)
Albumin: 4.4 g/dL (ref 3.5–5.2)
Alkaline Phosphatase: 67 U/L (ref 39–117)
BUN: 12 mg/dL (ref 6–23)
CO2: 29 mEq/L (ref 19–32)
Calcium: 9.6 mg/dL (ref 8.4–10.5)
Chloride: 101 mEq/L (ref 96–112)
Creatinine, Ser: 0.9 mg/dL (ref 0.40–1.20)
GFR: 74.85 mL/min (ref >60.00)
Glucose, Bld: 92 mg/dL (ref 70–99)
Potassium: 4.5 mEq/L (ref 3.5–5.1)
Sodium: 137 mEq/L (ref 135–145)
Total Bilirubin: 0.5 mg/dL (ref 0.2–1.2)
Total Protein: 6.9 g/dL (ref 6.0–8.3)

## 2022-11-27 LAB — LIPID PANEL
Cholesterol: 227 mg/dL — ABNORMAL HIGH (ref 0–200)
HDL: 85.2 mg/dL (ref >39.00)
LDL Cholesterol: 124 mg/dL — ABNORMAL HIGH (ref 0–99)
NonHDL: 141.5
Total CHOL/HDL Ratio: 3
Triglycerides: 88 mg/dL (ref 0.0–149.0)
VLDL: 17.6 mg/dL (ref 0.0–40.0)

## 2022-11-27 LAB — T4, FREE: Free T4: 1.11 ng/dL (ref 0.60–1.60)

## 2022-11-27 LAB — TSH: TSH: 0.82 u[IU]/mL (ref 0.35–5.50)

## 2022-11-27 LAB — T3, FREE: T3, Free: 3 pg/mL (ref 2.3–4.2)

## 2022-11-28 NOTE — Progress Notes (Signed)
No critical labs need to be addressed urgently. We will discuss labs in detail at upcoming office visit.   

## 2022-12-04 ENCOUNTER — Encounter: Payer: Self-pay | Admitting: Family Medicine

## 2022-12-04 ENCOUNTER — Ambulatory Visit (INDEPENDENT_AMBULATORY_CARE_PROVIDER_SITE_OTHER): Payer: BC Managed Care – PPO | Admitting: Family Medicine

## 2022-12-04 VITALS — BP 122/78 | HR 77 | Temp 97.9°F | Ht 67.5 in | Wt 186.0 lb

## 2022-12-04 DIAGNOSIS — Z0000 Encounter for general adult medical examination without abnormal findings: Secondary | ICD-10-CM

## 2022-12-04 DIAGNOSIS — R140 Abdominal distension (gaseous): Secondary | ICD-10-CM | POA: Diagnosis not present

## 2022-12-04 DIAGNOSIS — G43019 Migraine without aura, intractable, without status migrainosus: Secondary | ICD-10-CM | POA: Diagnosis not present

## 2022-12-04 DIAGNOSIS — Z1159 Encounter for screening for other viral diseases: Secondary | ICD-10-CM

## 2022-12-04 DIAGNOSIS — E039 Hypothyroidism, unspecified: Secondary | ICD-10-CM | POA: Diagnosis not present

## 2022-12-04 DIAGNOSIS — J4540 Moderate persistent asthma, uncomplicated: Secondary | ICD-10-CM

## 2022-12-04 MED ORDER — HYDROCHLOROTHIAZIDE 25 MG PO TABS: 25.0000 mg | ORAL_TABLET | Freq: Every day | ORAL | 11 refills | Status: DC | PRN

## 2022-12-04 MED ORDER — SUMATRIPTAN SUCCINATE 100 MG PO TABS: 100.0000 mg | ORAL_TABLET | ORAL | 1 refills | Status: DC | PRN

## 2022-12-04 MED ORDER — ALBUTEROL SULFATE HFA 108 (90 BASE) MCG/ACT IN AERS: 2.0000 | INHALATION_SPRAY | Freq: Four times a day (QID) | RESPIRATORY_TRACT | 0 refills | Status: DC | PRN

## 2022-12-04 NOTE — Progress Notes (Signed)
Patient ID: ZALEIGH BERMINGHAM, female    DOB: [birth year], 52 y.o.   MRN: [id number]  This visit was conducted in person.  BP 122/78 (BP Location: Left Arm, Patient Position: Sitting, Cuff Size: Normal)   Pulse 77   Temp 97.9 F (36.6 C) (Temporal)   Ht 5' 7.5" (1.715 m)   Wt 186 lb (84.4 kg)   LMP 09/07/2019   SpO2 99%   BMI 28.70 kg/m   BP Readings from Last 3 Encounters:  12/04/22 122/78  06/27/22 98/70  01/26/21 102/74    CC:  Chief Complaint  Patient presents with   Annual Exam    Subjective:   HPI: RIHANA KIDDY is a 52 y.o. female presenting on 12/04/2022 for Annual Exam The patient presents for  complete physical and review of chronic health problems. He/She also has the following acute concerns today: Bloating issues 1 week out of the month. She has not had a menses in 700 day.. likely post menopausal. She reports episodes of lower abdominal bloating. Leg swelling and breast tenderness. HCTZ helps with the swelling during those episodes. Has appt with GYN in 02/2022  No diarrhea and no blood in stool.   Asthma moderate persistent: Allergy treatment and albuterol prn.  Hypothyroidism   Followed by ENDO,  now on levo 112 mcg daily. Lab Results  Component Value Date   TSH 0.82 11/27/2022    Migraine without Aura:   Seem to be happening 1 times per 2 month. Using  sumatriptan prn.    Diet: Healthy  Exercise: She is sitting more, walking and gym 3-4 times a week.  Wt Readings from Last 3 Encounters:  12/04/22 186 lb (84.4 kg)  06/27/22 173 lb 8 oz (78.7 kg)  01/26/21 168 lb (76.2 kg)    The 10-year ASCVD risk score (Arnett DK, et al., 2019) is: 0.7%   Values used to calculate the score:     Age: 41 years     Sex: Female     Is Non-Hispanic African American: No     Diabetic: No     Tobacco smoker: No     Systolic Blood Pressure: 947 mmHg     Is BP treated: No     HDL Cholesterol: 85.2 mg/dL     Total Cholesterol: 227 mg/dL  Relevant past medical,  surgical, family and social history reviewed and updated as indicated. Interim medical history since our last visit reviewed. Allergies and medications reviewed and updated. Outpatient Medications Prior to Visit  Medication Sig Dispense Refill   clotrimazole-betamethasone (LOTRISONE) cream Lotrisone 1 %-0.05 % topical cream  APPLY TO THE AFFECTED AND SURROUNDING AREAS OF SKIN BY TOPICAL ROUTE 2 TIMES PER DAY IN THE MORNING AND EVENING     levothyroxine (SYNTHROID) 75 MCG tablet Take 75 mcg by mouth daily before breakfast.     Meth-Hyo-M Bl-Na Phos-Ph Sal (URIBEL) 118 MG CAPS Uribel 118 mg-10 mg-40.8 mg-36 mg capsule  1 po tid x 5 days then prn 30 capsule 0   Multiple Vitamin (MULTIVITAMIN) tablet Take 1 tablet by mouth daily.     albuterol (VENTOLIN HFA) 108 (90 Base) MCG/ACT inhaler Inhale 2 puffs into the lungs every 6 (six) hours as needed for wheezing. 16 g 0   hydrochlorothiazide (HYDRODIURIL) 25 MG tablet Take 1 tablet (25 mg total) by mouth daily as needed (swelling). 30 tablet 11   SUMAtriptan (IMITREX) 100 MG tablet Take 1 tablet (100 mg total) by mouth every 2 (two) hours  as needed for migraine. May repeat in 2 hours if headache persists or recurs. 10 tablet 1   No facility-administered medications prior to visit.     Per HPI unless specifically indicated in ROS section below Review of Systems  Constitutional:  Negative for chills and fever.  HENT:  Negative for congestion and ear pain.   Eyes:  Negative for pain and redness.  Respiratory:  Negative for cough and shortness of breath.   Cardiovascular:  Negative for chest pain, palpitations and leg swelling.  Gastrointestinal:  Negative for abdominal pain, blood in stool, constipation, diarrhea, nausea and vomiting.  Genitourinary:  Negative for dysuria.  Musculoskeletal:  Negative for myalgias.  Skin:  Negative for rash.  Neurological:  Negative for dizziness.  Psychiatric/Behavioral:  The patient is not nervous/anxious.     Objective:  BP 122/78 (BP Location: Left Arm, Patient Position: Sitting, Cuff Size: Normal)   Pulse 77   Temp 97.9 F (36.6 C) (Temporal)   Ht 5' 7.5" (1.715 m)   Wt 186 lb (84.4 kg)   LMP 09/07/2019   SpO2 99%   BMI 28.70 kg/m   Wt Readings from Last 3 Encounters:  12/04/22 186 lb (84.4 kg)  06/27/22 173 lb 8 oz (78.7 kg)  01/26/21 168 lb (76.2 kg)      Physical Exam Constitutional:      General: She is not in acute distress.    Appearance: Normal appearance. She is well-developed. She is not ill-appearing or toxic-appearing.  HENT:     Head: Normocephalic.     Right Ear: Hearing, tympanic membrane, ear canal and external ear normal.     Left Ear: Hearing, tympanic membrane, ear canal and external ear normal.     Nose: Nose normal.  Eyes:     General: Lids are normal. Lids are everted, no foreign bodies appreciated.     Conjunctiva/sclera: Conjunctivae normal.     Pupils: Pupils are equal, round, and reactive to light.  Neck:     Thyroid: No thyroid mass or thyromegaly.     Vascular: No carotid bruit.     Trachea: Trachea normal.  Cardiovascular:     Rate and Rhythm: Normal rate and regular rhythm.     Heart sounds: Normal heart sounds, S1 normal and S2 normal. No murmur heard.    No gallop.  Pulmonary:     Effort: Pulmonary effort is normal. No respiratory distress.     Breath sounds: Normal breath sounds. No wheezing, rhonchi or rales.  Abdominal:     General: Bowel sounds are normal. There is no distension or abdominal bruit.     Palpations: Abdomen is soft. There is no fluid wave or mass.     Tenderness: There is no abdominal tenderness. There is no guarding or rebound.     Hernia: No hernia is present.  Musculoskeletal:     Cervical back: Normal range of motion and neck supple.  Lymphadenopathy:     Cervical: No cervical adenopathy.  Skin:    General: Skin is warm and dry.     Findings: No rash.  Neurological:     Mental Status: She is alert.      Cranial Nerves: No cranial nerve deficit.     Sensory: No sensory deficit.  Psychiatric:        Mood and Affect: Mood is not anxious or depressed.        Speech: Speech normal.        Behavior: Behavior normal. Behavior is  cooperative.        Judgment: Judgment normal.       Results for orders placed or performed in visit on 11/27/22  Comprehensive metabolic panel  Result Value Ref Range   Sodium 137 135 - 145 mEq/L   Potassium 4.5 3.5 - 5.1 mEq/L   Chloride 101 96 - 112 mEq/L   CO2 29 19 - 32 mEq/L   Glucose, Bld 92 70 - 99 mg/dL   BUN 12 6 - 23 mg/dL   Creatinine, Ser 0.90 0.40 - 1.20 mg/dL   Total Bilirubin 0.5 0.2 - 1.2 mg/dL   Alkaline Phosphatase 67 39 - 117 U/L   AST 31 0 - 37 U/L   ALT 22 0 - 35 U/L   Total Protein 6.9 6.0 - 8.3 g/dL   Albumin 4.4 3.5 - 5.2 g/dL   GFR 74.85 >60.00 mL/min   Calcium 9.6 8.4 - 10.5 mg/dL  T3, free  Result Value Ref Range   T3, Free 3.0 2.3 - 4.2 pg/mL  T4, free  Result Value Ref Range   Free T4 1.11 0.60 - 1.60 ng/dL  TSH  Result Value Ref Range   TSH 0.82 0.35 - 5.50 uIU/mL  Lipid panel  Result Value Ref Range   Cholesterol 227 (H) 0 - 200 mg/dL   Triglycerides 88.0 0.0 - 149.0 mg/dL   HDL 85.20 >39.00 mg/dL   VLDL 17.6 0.0 - 40.0 mg/dL   LDL Cholesterol 124 (H) 0 - 99 mg/dL   Total CHOL/HDL Ratio 3    NonHDL 141.50     This visit occurred during the SARS-CoV-2 public health emergency.  Safety protocols were in place, including screening questions prior to the visit, additional usage of staff PPE, and extensive cleaning of exam room while observing appropriate contact time as indicated for disinfecting solutions.   COVID 19 screen:  No recent travel or known exposure to COVID19 The patient denies respiratory symptoms of COVID 19 at this time. The importance of social distancing was discussed today.   Assessment and Plan   The patient's preventative maintenance and recommended screening tests for an annual wellness exam  were reviewed in full today. Brought up to date unless services declined.  Counselled on the importance of diet, exercise, and its role in overall health and mortality. The patient's FH and SH was reviewed, including their home life, tobacco status, and drug and alcohol status.   Vaccines: Refused flu, COVID. Consdiering the shingles vaccine at age 47. Pap/DVE: , Last pap 02/2022, negative Followed by GYN, Dr. Cletis Media Mammo:  11/2017 per GYN,  scheduled Colon: no early family history neg 2011, 12/25/2020...repeat in 10 years.  Smoking Status: none ETOH/ drug use: 1-2 glasses of wine a week/none  HIV screen:   refused  hep C due   Problem List Items Addressed This Visit     Abdominal bloating    Acute, intermittent... seems cyclical.  She will discus symtpoms in detail with her GYN.  Swelling in legs associated has improved with HCTZ 25 mg prn.  Discussed that symptoms could potentially be dietary.  She will keep a home food diary.  We will test for gluten allergy.      Relevant Orders   Gliadin Antibodies, Serum   Asthma, moderate persistent (Chronic)    Stable, chronic.  Continue current medication.  Albuterol prn and allergy meds.      Relevant Medications   albuterol (VENTOLIN HFA) 108 (90 Base) MCG/ACT inhaler   Hypothyroidism  Stable control  on levo 75 mg.. followed by ENDO.      Migraine without aura   Relevant Medications   hydrochlorothiazide (HYDRODIURIL) 25 MG tablet   SUMAtriptan (IMITREX) 100 MG tablet   Other Visit Diagnoses     Routine general medical examination at a health care facility    -  Primary   Need for hepatitis C screening test       Relevant Orders   Hepatitis C antibody        Eliezer Lofts, MD

## 2022-12-04 NOTE — Patient Instructions (Addendum)
Please stop at the lab to have labs drawn. Work on low cholesterol diet and regular exercise.  Keep food and stress diary during the times you have bloating episodes.  Discuss with GYN the issues weekly.

## 2022-12-04 NOTE — Assessment & Plan Note (Signed)
Stable control  on levo 75 mg.. followed by ENDO.

## 2022-12-04 NOTE — Assessment & Plan Note (Signed)
Acute, intermittent... seems cyclical.  She will discus symtpoms in detail with her GYN.  Swelling in legs associated has improved with HCTZ 25 mg prn.  Discussed that symptoms could potentially be dietary.  She will keep a home food diary.  We will test for gluten allergy.

## 2022-12-04 NOTE — Assessment & Plan Note (Signed)
Stable, chronic.  Continue current medication.  Albuterol prn and allergy meds.

## 2022-12-05 LAB — GLIADIN ANTIBODIES, SERUM
Gliadin IgA: <1 U/mL
Gliadin IgG: <1 U/mL

## 2022-12-05 LAB — HEPATITIS C ANTIBODY: Hepatitis C Ab: NONREACTIVE

## 2022-12-13 ENCOUNTER — Ambulatory Visit
Admission: RE | Admit: 2022-12-13 | Discharge: 2022-12-13 | Disposition: A | Discharge status: Home / Self Care | Payer: BC Managed Care – PPO | Source: Ambulatory Visit | Attending: Obstetrics and Gynecology | Admitting: Obstetrics and Gynecology

## 2022-12-13 DIAGNOSIS — R922 Inconclusive mammogram: Secondary | ICD-10-CM | POA: Diagnosis not present

## 2022-12-13 DIAGNOSIS — N6011 Diffuse cystic mastopathy of right breast: Secondary | ICD-10-CM | POA: Diagnosis not present

## 2022-12-13 DIAGNOSIS — R928 Other abnormal and inconclusive findings on diagnostic imaging of breast: Secondary | ICD-10-CM

## 2022-12-13 DIAGNOSIS — N6012 Diffuse cystic mastopathy of left breast: Secondary | ICD-10-CM | POA: Diagnosis not present

## 2023-02-06 DIAGNOSIS — E890 Postprocedural hypothyroidism: Secondary | ICD-10-CM | POA: Diagnosis not present

## 2023-03-06 DIAGNOSIS — H04123 Dry eye syndrome of bilateral lacrimal glands: Secondary | ICD-10-CM | POA: Diagnosis not present

## 2023-03-11 DIAGNOSIS — Z01419 Encounter for gynecological examination (general) (routine) without abnormal findings: Secondary | ICD-10-CM | POA: Diagnosis not present

## 2023-03-11 DIAGNOSIS — L900 Lichen sclerosus et atrophicus: Secondary | ICD-10-CM | POA: Diagnosis not present

## 2023-03-11 DIAGNOSIS — Z139 Encounter for screening, unspecified: Secondary | ICD-10-CM | POA: Diagnosis not present

## 2023-03-11 DIAGNOSIS — N951 Menopausal and female climacteric states: Secondary | ICD-10-CM | POA: Diagnosis not present

## 2023-04-07 DIAGNOSIS — E890 Postprocedural hypothyroidism: Secondary | ICD-10-CM | POA: Diagnosis not present

## 2023-04-08 DIAGNOSIS — E039 Hypothyroidism, unspecified: Secondary | ICD-10-CM | POA: Diagnosis not present

## 2023-04-08 DIAGNOSIS — L900 Lichen sclerosus et atrophicus: Secondary | ICD-10-CM | POA: Diagnosis not present

## 2023-04-08 DIAGNOSIS — R002 Palpitations: Secondary | ICD-10-CM | POA: Diagnosis not present

## 2023-04-08 DIAGNOSIS — N951 Menopausal and female climacteric states: Secondary | ICD-10-CM | POA: Diagnosis not present

## 2023-04-11 ENCOUNTER — Ambulatory Visit: Payer: BC Managed Care – PPO | Attending: Internal Medicine

## 2023-04-11 ENCOUNTER — Telehealth: Payer: Self-pay | Admitting: Family Medicine

## 2023-04-11 ENCOUNTER — Ambulatory Visit: Payer: BC Managed Care – PPO | Admitting: Family Medicine

## 2023-04-11 ENCOUNTER — Ambulatory Visit: Payer: BC Managed Care – PPO | Admitting: Internal Medicine

## 2023-04-11 ENCOUNTER — Encounter: Payer: Self-pay | Admitting: Internal Medicine

## 2023-04-11 VITALS — BP 110/70 | HR 75 | Temp 97.6°F | Ht 67.5 in | Wt 185.0 lb

## 2023-04-11 DIAGNOSIS — R002 Palpitations: Secondary | ICD-10-CM | POA: Insufficient documentation

## 2023-04-11 MED ORDER — METOPROLOL SUCCINATE ER 25 MG PO TB24: 25.0000 mg | ORAL_TABLET | Freq: Every day | ORAL | 1 refills | Status: DC

## 2023-04-11 NOTE — Telephone Encounter (Signed)
See OV note.  

## 2023-04-11 NOTE — Telephone Encounter (Signed)
Pt called in requesting an EKG, pt states she has been experiencing heart racing and pounding. Pt states she was also told by her smart watch about her current heart racing. Scheduled pt with Dr. Ermalene Searing today, 6/14 @ 8:20 AM. Transferred pt to access nurse. Call back # [phone]

## 2023-04-11 NOTE — Progress Notes (Signed)
Subjective:    Patient ID: Anna [name], female    DOB: 1971-08-03, 53 y.o.   MRN: [id number]  HPI Here due to palpitations---atrial fib noted on Apple watch this morning With husband  Noticed pounding in her throat a week ago Worsened for a couple of days Improved and then worsened again Just started hormone replacement---estrogen patch and progesterone. Gyn did not think that would cause this Recent TSH normal  No chest pain No SOB Does exercise regularly--but has held off this week. No change in exercise tolerance No dizziness or syncope Slight chronic edema---at the end of the day  Current Outpatient Medications on File Prior to Visit  Medication Sig Dispense Refill   albuterol (VENTOLIN HFA) 108 (90 Base) MCG/ACT inhaler Inhale 2 puffs into the lungs every 6 (six) hours as needed for wheezing. 16 g 0   clotrimazole-betamethasone (LOTRISONE) cream Lotrisone 1 %-0.05 % topical cream  APPLY TO THE AFFECTED AND SURROUNDING AREAS OF SKIN BY TOPICAL ROUTE 2 TIMES PER DAY IN THE MORNING AND EVENING     estradiol (CLIMARA - DOSED IN MG/24 HR) 0.0375 mg/24hr patch Place 0.0375 mg onto the skin once a week.     hydrochlorothiazide (HYDRODIURIL) 25 MG tablet Take 1 tablet (25 mg total) by mouth daily as needed (swelling). 30 tablet 11   levothyroxine (SYNTHROID) 75 MCG tablet Take 75 mcg by mouth daily before breakfast.     Meth-Hyo-M Bl-Na Phos-Ph Sal (URIBEL) 118 MG CAPS Uribel 118 mg-10 mg-40.8 mg-36 mg capsule  1 po tid x 5 days then prn 30 capsule 0   Multiple Vitamin (MULTIVITAMIN) tablet Take 1 tablet by mouth daily.     progesterone (PROMETRIUM) 100 MG capsule Take 100 mg by mouth daily.     SUMAtriptan (IMITREX) 100 MG tablet Take 1 tablet (100 mg total) by mouth every 2 (two) hours as needed for migraine. May repeat in 2 hours if headache persists or recurs. 10 tablet 1   No current facility-administered medications on file prior to visit.    Allergies  Allergen Reactions    Penicillins Rash    Has patient had a PCN reaction causing immediate rash, facial/tongue/throat swelling, SOB or lightheadedness with hypotension: Yes Has patient had a PCN reaction causing severe rash involving mucus membranes or skin necrosis: No Has patient had a PCN reaction that required hospitalization: No Has patient had a PCN reaction occurring within the last 10 years: No If all of the above answers are "NO", then may proceed with Cephalosporin use.   Sulfonamide Derivatives Rash    Past Medical History:  Diagnosis Date   Abdominal pain, other specified site    Abnormal Pap smear    Age 62-17   Acute maxillary sinusitis    Allergic rhinitis, cause unspecified    Candidiasis of vulva and vagina    Diverticulitis 08/29/11   Eczema 2006   Rectal   Endometriosis 08/29/11   Endometriosis of ovary    Esophageal reflux    Fibroid    H/O bladder infections    H/O dysmenorrhea 2008   H/O menorrhagia 2008   H/O varicella    H/O varicose veins    Hemorrhoid 2007   Leukorrhea 11/25/11   LLQ pain 2010   Migraine without aura, without mention of intractable migraine without mention of status migrainosus    Mitral valve prolapse    Myalgia and myositis, unspecified    Other atopic dermatitis and related conditions    Ovarian cyst 2008  Left   Pelvic pressure in female 2006   Personal history of malignant neoplasm of thyroid    PONV (postoperative nausea and vomiting)    Proteinuria 2010   Rectal itching 2006   Unspecified tinnitus    Left   Vaginal candida 2007   Vaginitis and vulvovaginitis, unspecified    Yeast vaginitis 2010    Past Surgical History:  Procedure Laterality Date   LAPAROSCOPIC OVARIAN CYSTECTOMY     THYROID LOBECTOMY Left 07/16/2018   Procedure: LEFT  THYROID LOBECTOMY;  Surgeon: Darnell Level, MD;  Location: [facility];  Service: General;  Laterality: Left;   WISDOM TOOTH EXTRACTION      Family History  Problem Relation Age of Onset   Healthy Father     Colon polyps Father    Thyroid cancer Mother    Irritable bowel syndrome Mother    Diabetes Maternal Grandmother    Diabetes Maternal Grandfather    Other Paternal Grandfather        CABG age 51   Healthy Brother     Social History   Socioeconomic History   Marital status: Married    Spouse name: Not on file   Number of children: Not on file   Years of education: Not on file   Highest education level: Not on file  Occupational History   Occupation: stay at home mom; part time admin. asst    Employer: [employer]  Tobacco Use   Smoking status: Former   Smokeless tobacco: Never   Tobacco comments:    quit over 16 years ago  Vaping Use   Vaping Use: Never used  Substance and Sexual Activity   Alcohol use: Yes    Alcohol/week: 1.0 - 2.0 standard drink of alcohol    Types: 1 - 2 Standard drinks or equivalent per week    Comment: wine; beer   Drug use: No   Sexual activity: Yes    Birth control/protection: Injection    Comment: Husband had vasectomy  Other Topics Concern   Not on file  Social History Narrative   Stay at home mom; part time administrative asst      Married      Children      Regular exercise-yes 3 days/week cardio 45 min      3 meals; fruit/veggies, no soda, lots of water   Social Determinants of Health   Financial Resource Strain: Not on file  Food Insecurity: Not on file  Transportation Needs: Not on file  Physical Activity: Not on file  Stress: Not on file  Social Connections: Not on file  Intimate Partner Violence: Not on file   Review of Systems Weight is stable Sleeping okay 1 cup caffeine coffee in AM--no other caffeine     Objective:   Physical Exam Constitutional:      Appearance: Normal appearance.  Cardiovascular:     Rate and Rhythm: Normal rate and regular rhythm.     Pulses: Normal pulses.     Heart sounds: No murmur heard.    No gallop.  Pulmonary:     Effort: Pulmonary effort is normal.     Breath  sounds: Normal breath sounds. No wheezing or rales.  Abdominal:     Palpations: Abdomen is soft.     Tenderness: There is no abdominal tenderness.  Musculoskeletal:     Cervical back: Neck supple.     Right lower leg: No edema.     Left lower leg: No edema.  Lymphadenopathy:  Cervical: No cervical adenopathy.  Neurological:     Mental Status: She is alert.  Psychiatric:        Mood and Affect: Mood normal.        Behavior: Behavior normal.            Assessment & Plan:

## 2023-04-11 NOTE — Assessment & Plan Note (Addendum)
EKG shows sinus at 73 with multiple PACs iWatch showed atrial fib---this must be considered Symptoms have been worrisome  Not likely related to recent hormone therapy---might be worth trying off this for a short time Recent TSH normal Will set up Zio monitor Check echo Trial toprol 25 if ongoing worrisome symptoms  Not sure she would need anticoagulation if has atrial fib--but can consider asa 81 daily or every other day if her symptoms persist

## 2023-04-11 NOTE — Telephone Encounter (Signed)
Per appt notes pt has been arrived to see Dr Alphonsus Sias; sending note to Dr Alphonsus Sias.

## 2023-04-11 NOTE — Telephone Encounter (Signed)
Dr. Ermalene Searing was delayed getting to office today.  Patient was moved to Dr. Karle Starch schedule.

## 2023-04-14 DIAGNOSIS — E890 Postprocedural hypothyroidism: Secondary | ICD-10-CM | POA: Diagnosis not present

## 2023-04-14 LAB — LAB REPORT - SCANNED: EGFR: 85

## 2023-04-15 DIAGNOSIS — R002 Palpitations: Secondary | ICD-10-CM | POA: Diagnosis not present

## 2023-04-15 DIAGNOSIS — E890 Postprocedural hypothyroidism: Secondary | ICD-10-CM | POA: Diagnosis not present

## 2023-04-26 DIAGNOSIS — R002 Palpitations: Secondary | ICD-10-CM | POA: Diagnosis not present

## 2023-05-19 ENCOUNTER — Other Ambulatory Visit: Payer: Self-pay | Admitting: Internal Medicine

## 2023-05-19 DIAGNOSIS — R002 Palpitations: Secondary | ICD-10-CM

## 2023-05-19 DIAGNOSIS — I4892 Unspecified atrial flutter: Secondary | ICD-10-CM

## 2023-05-28 ENCOUNTER — Other Ambulatory Visit: Payer: BC Managed Care – PPO

## 2023-06-04 DIAGNOSIS — E890 Postprocedural hypothyroidism: Secondary | ICD-10-CM | POA: Diagnosis not present

## 2023-06-17 DIAGNOSIS — M25562 Pain in left knee: Secondary | ICD-10-CM | POA: Diagnosis not present

## 2023-06-24 DIAGNOSIS — H04123 Dry eye syndrome of bilateral lacrimal glands: Secondary | ICD-10-CM | POA: Diagnosis not present

## 2023-06-24 DIAGNOSIS — S83512A Sprain of anterior cruciate ligament of left knee, initial encounter: Secondary | ICD-10-CM | POA: Diagnosis not present

## 2023-06-24 DIAGNOSIS — M25562 Pain in left knee: Secondary | ICD-10-CM | POA: Diagnosis not present

## 2023-06-24 DIAGNOSIS — M660 Rupture of popliteal cyst: Secondary | ICD-10-CM | POA: Diagnosis not present

## 2023-06-24 DIAGNOSIS — S83272A Complex tear of lateral meniscus, current injury, left knee, initial encounter: Secondary | ICD-10-CM | POA: Diagnosis not present

## 2023-06-24 DIAGNOSIS — S83412A Sprain of medial collateral ligament of left knee, initial encounter: Secondary | ICD-10-CM | POA: Diagnosis not present

## 2023-07-11 DIAGNOSIS — M25562 Pain in left knee: Secondary | ICD-10-CM | POA: Diagnosis not present

## 2023-07-28 DIAGNOSIS — L821 Other seborrheic keratosis: Secondary | ICD-10-CM | POA: Diagnosis not present

## 2023-07-28 DIAGNOSIS — L819 Disorder of pigmentation, unspecified: Secondary | ICD-10-CM | POA: Diagnosis not present

## 2023-07-28 DIAGNOSIS — D224 Melanocytic nevi of scalp and neck: Secondary | ICD-10-CM | POA: Diagnosis not present

## 2023-07-28 DIAGNOSIS — D225 Melanocytic nevi of trunk: Secondary | ICD-10-CM | POA: Diagnosis not present

## 2023-07-30 DIAGNOSIS — E890 Postprocedural hypothyroidism: Secondary | ICD-10-CM | POA: Diagnosis not present

## 2023-08-06 DIAGNOSIS — M6752 Plica syndrome, left knee: Secondary | ICD-10-CM | POA: Diagnosis not present

## 2023-08-06 DIAGNOSIS — Y999 Unspecified external cause status: Secondary | ICD-10-CM | POA: Diagnosis not present

## 2023-08-06 DIAGNOSIS — S83262A Peripheral tear of lateral meniscus, current injury, left knee, initial encounter: Secondary | ICD-10-CM | POA: Diagnosis not present

## 2023-08-06 DIAGNOSIS — S83282A Other tear of lateral meniscus, current injury, left knee, initial encounter: Secondary | ICD-10-CM | POA: Diagnosis not present

## 2023-08-06 DIAGNOSIS — G8918 Other acute postprocedural pain: Secondary | ICD-10-CM | POA: Diagnosis not present

## 2023-08-06 DIAGNOSIS — M65862 Other synovitis and tenosynovitis, left lower leg: Secondary | ICD-10-CM | POA: Diagnosis not present

## 2023-08-06 DIAGNOSIS — X58XXXA Exposure to other specified factors, initial encounter: Secondary | ICD-10-CM | POA: Diagnosis not present

## 2023-08-06 DIAGNOSIS — M2242 Chondromalacia patellae, left knee: Secondary | ICD-10-CM | POA: Diagnosis not present

## 2023-08-12 DIAGNOSIS — R6889 Other general symptoms and signs: Secondary | ICD-10-CM | POA: Diagnosis not present

## 2023-08-12 DIAGNOSIS — M25561 Pain in right knee: Secondary | ICD-10-CM | POA: Diagnosis not present

## 2023-08-12 DIAGNOSIS — Z9889 Other specified postprocedural states: Secondary | ICD-10-CM | POA: Diagnosis not present

## 2023-08-12 DIAGNOSIS — M25662 Stiffness of left knee, not elsewhere classified: Secondary | ICD-10-CM | POA: Diagnosis not present

## 2023-08-12 DIAGNOSIS — M25562 Pain in left knee: Secondary | ICD-10-CM | POA: Diagnosis not present

## 2023-08-19 DIAGNOSIS — M25561 Pain in right knee: Secondary | ICD-10-CM | POA: Diagnosis not present

## 2023-08-27 DIAGNOSIS — M25561 Pain in right knee: Secondary | ICD-10-CM | POA: Diagnosis not present

## 2023-08-28 ENCOUNTER — Telehealth: Payer: BC Managed Care – PPO | Admitting: Physician Assistant

## 2023-08-28 DIAGNOSIS — J019 Acute sinusitis, unspecified: Secondary | ICD-10-CM | POA: Diagnosis not present

## 2023-08-28 DIAGNOSIS — B9689 Other specified bacterial agents as the cause of diseases classified elsewhere: Secondary | ICD-10-CM

## 2023-08-28 MED ORDER — DOXYCYCLINE HYCLATE 100 MG PO TABS: 100.0000 mg | ORAL_TABLET | Freq: Two times a day (BID) | ORAL | 0 refills | Status: DC

## 2023-08-28 MED ORDER — FLUCONAZOLE 150 MG PO TABS: ORAL_TABLET | ORAL | 0 refills | Status: DC

## 2023-08-28 NOTE — Progress Notes (Signed)
I have spent 5 minutes in review of e-visit questionnaire, review and updating patient chart, medical decision making and response to patient.   Mia Milan Cody Jacklynn Dehaas, PA-C    

## 2023-08-28 NOTE — Progress Notes (Signed)

## 2023-08-28 NOTE — Addendum Note (Signed)
Addended by: Waldon Merl on: 08/28/2023 12:24 PM   Modules accepted: Orders

## 2023-10-07 ENCOUNTER — Other Ambulatory Visit: Payer: Self-pay | Admitting: Endocrinology

## 2023-10-07 DIAGNOSIS — E041 Nontoxic single thyroid nodule: Secondary | ICD-10-CM

## 2023-10-08 DIAGNOSIS — E890 Postprocedural hypothyroidism: Secondary | ICD-10-CM | POA: Diagnosis not present

## 2023-10-10 ENCOUNTER — Other Ambulatory Visit: Payer: BC Managed Care – PPO

## 2023-10-15 ENCOUNTER — Other Ambulatory Visit: Payer: Self-pay | Admitting: Endocrinology

## 2023-10-15 DIAGNOSIS — Z808 Family history of malignant neoplasm of other organs or systems: Secondary | ICD-10-CM

## 2023-10-15 DIAGNOSIS — E890 Postprocedural hypothyroidism: Secondary | ICD-10-CM | POA: Diagnosis not present

## 2023-10-16 ENCOUNTER — Ambulatory Visit
Admission: RE | Admit: 2023-10-16 | Discharge: 2023-10-16 | Disposition: A | Discharge status: Home / Self Care | Payer: BC Managed Care – PPO | Source: Ambulatory Visit | Attending: Endocrinology | Admitting: Endocrinology

## 2023-10-16 DIAGNOSIS — Z808 Family history of malignant neoplasm of other organs or systems: Secondary | ICD-10-CM

## 2023-10-16 DIAGNOSIS — Z9089 Acquired absence of other organs: Secondary | ICD-10-CM | POA: Diagnosis not present

## 2023-11-11 DIAGNOSIS — N950 Postmenopausal bleeding: Secondary | ICD-10-CM | POA: Diagnosis not present

## 2023-11-11 DIAGNOSIS — Z79890 Hormone replacement therapy (postmenopausal): Secondary | ICD-10-CM | POA: Diagnosis not present

## 2023-11-11 DIAGNOSIS — R9389 Abnormal findings on diagnostic imaging of other specified body structures: Secondary | ICD-10-CM | POA: Diagnosis not present

## 2023-11-12 DIAGNOSIS — N950 Postmenopausal bleeding: Secondary | ICD-10-CM | POA: Diagnosis not present

## 2023-11-12 DIAGNOSIS — R9389 Abnormal findings on diagnostic imaging of other specified body structures: Secondary | ICD-10-CM | POA: Diagnosis not present

## 2023-11-12 DIAGNOSIS — R350 Frequency of micturition: Secondary | ICD-10-CM | POA: Diagnosis not present

## 2023-11-17 ENCOUNTER — Encounter: Payer: Self-pay | Admitting: Family Medicine

## 2023-11-17 DIAGNOSIS — E039 Hypothyroidism, unspecified: Secondary | ICD-10-CM

## 2023-11-17 DIAGNOSIS — Z13220 Encounter for screening for lipoid disorders: Secondary | ICD-10-CM

## 2023-11-17 DIAGNOSIS — R635 Abnormal weight gain: Secondary | ICD-10-CM

## 2023-12-04 ENCOUNTER — Encounter: Payer: Self-pay | Admitting: Family Medicine

## 2023-12-04 ENCOUNTER — Other Ambulatory Visit: Payer: BC Managed Care – PPO

## 2023-12-04 ENCOUNTER — Other Ambulatory Visit (INDEPENDENT_AMBULATORY_CARE_PROVIDER_SITE_OTHER): Payer: BC Managed Care – PPO

## 2023-12-04 ENCOUNTER — Telehealth: Payer: BC Managed Care – PPO | Admitting: Physician Assistant

## 2023-12-04 DIAGNOSIS — R3989 Other symptoms and signs involving the genitourinary system: Secondary | ICD-10-CM | POA: Diagnosis not present

## 2023-12-04 DIAGNOSIS — R635 Abnormal weight gain: Secondary | ICD-10-CM

## 2023-12-04 DIAGNOSIS — Z13220 Encounter for screening for lipoid disorders: Secondary | ICD-10-CM | POA: Diagnosis not present

## 2023-12-04 LAB — LIPID PANEL
Cholesterol: 204 mg/dL — ABNORMAL HIGH (ref 0–200)
HDL: 77.6 mg/dL (ref >39.00)
LDL Cholesterol: 110 mg/dL — ABNORMAL HIGH (ref 0–99)
NonHDL: 126.64
Total CHOL/HDL Ratio: 3
Triglycerides: 82 mg/dL (ref 0.0–149.0)
VLDL: 16.4 mg/dL (ref 0.0–40.0)

## 2023-12-04 LAB — T4, FREE: Free T4: 1.06 ng/dL (ref 0.60–1.60)

## 2023-12-04 LAB — COMPREHENSIVE METABOLIC PANEL
ALT: 19 U/L (ref 0–35)
AST: 21 U/L (ref 0–37)
Albumin: 4.3 g/dL (ref 3.5–5.2)
Alkaline Phosphatase: 59 U/L (ref 39–117)
BUN: 15 mg/dL (ref 6–23)
CO2: 28 meq/L (ref 19–32)
Calcium: 9.2 mg/dL (ref 8.4–10.5)
Chloride: 100 meq/L (ref 96–112)
Creatinine, Ser: 0.77 mg/dL (ref 0.40–1.20)
GFR: 89.61 mL/min (ref >60.00)
Glucose, Bld: 100 mg/dL — ABNORMAL HIGH (ref 70–99)
Potassium: 4.5 meq/L (ref 3.5–5.1)
Sodium: 136 meq/L (ref 135–145)
Total Bilirubin: 0.6 mg/dL (ref 0.2–1.2)
Total Protein: 6.8 g/dL (ref 6.0–8.3)

## 2023-12-04 LAB — TSH: TSH: 1.44 u[IU]/mL (ref 0.35–5.50)

## 2023-12-04 LAB — T3, FREE: T3, Free: 2.9 pg/mL (ref 2.3–4.2)

## 2023-12-04 MED ORDER — NITROFURANTOIN MONOHYD MACRO 100 MG PO CAPS: 100.0000 mg | ORAL_CAPSULE | Freq: Two times a day (BID) | ORAL | 0 refills | Status: DC

## 2023-12-04 NOTE — Progress Notes (Signed)
 I have spent 5 minutes in review of e-visit questionnaire, review and updating patient chart, medical decision making and response to patient.   Piedad Climes, PA-C

## 2023-12-04 NOTE — Progress Notes (Signed)
 No critical labs need to be addressed urgently. We will discuss labs in detail at upcoming office visit.

## 2023-12-04 NOTE — Progress Notes (Signed)

## 2023-12-05 LAB — CORTISOL-AM, BLOOD: Cortisol - AM: 18.9 ug/dL

## 2023-12-05 MED ORDER — PHENAZOPYRIDINE HCL 100 MG PO TABS: 100.0000 mg | ORAL_TABLET | Freq: Three times a day (TID) | ORAL | 0 refills | Status: DC | PRN

## 2023-12-05 NOTE — Addendum Note (Signed)
 Addended by: Angelia Kelp on: 12/05/2023 07:25 AM   Modules accepted: Orders

## 2023-12-08 DIAGNOSIS — N952 Postmenopausal atrophic vaginitis: Secondary | ICD-10-CM | POA: Diagnosis not present

## 2023-12-08 DIAGNOSIS — N3000 Acute cystitis without hematuria: Secondary | ICD-10-CM | POA: Diagnosis not present

## 2023-12-08 DIAGNOSIS — R350 Frequency of micturition: Secondary | ICD-10-CM | POA: Diagnosis not present

## 2023-12-11 ENCOUNTER — Encounter: Payer: Self-pay | Admitting: Family Medicine

## 2023-12-11 ENCOUNTER — Ambulatory Visit: Payer: BC Managed Care – PPO | Admitting: Family Medicine

## 2023-12-11 VITALS — BP 100/78 | HR 65 | Temp 98.1°F | Ht 66.75 in | Wt 186.1 lb

## 2023-12-11 DIAGNOSIS — R7303 Prediabetes: Secondary | ICD-10-CM

## 2023-12-11 DIAGNOSIS — Z0000 Encounter for general adult medical examination without abnormal findings: Secondary | ICD-10-CM | POA: Diagnosis not present

## 2023-12-11 DIAGNOSIS — G43019 Migraine without aura, intractable, without status migrainosus: Secondary | ICD-10-CM | POA: Diagnosis not present

## 2023-12-11 DIAGNOSIS — J4540 Moderate persistent asthma, uncomplicated: Secondary | ICD-10-CM

## 2023-12-11 DIAGNOSIS — E039 Hypothyroidism, unspecified: Secondary | ICD-10-CM | POA: Diagnosis not present

## 2023-12-11 DIAGNOSIS — Z23 Encounter for immunization: Secondary | ICD-10-CM | POA: Diagnosis not present

## 2023-12-11 LAB — POCT GLYCOSYLATED HEMOGLOBIN (HGB A1C): Hemoglobin A1C: 5.3 % (ref 4.0–5.6)

## 2023-12-11 MED ORDER — LEVOTHYROXINE SODIUM 112 MCG PO TABS: 112.0000 ug | ORAL_TABLET | Freq: Every day | ORAL | Status: AC

## 2023-12-11 MED ORDER — SUMATRIPTAN SUCCINATE 100 MG PO TABS: 100.0000 mg | ORAL_TABLET | ORAL | 1 refills | Status: AC | PRN

## 2023-12-11 MED ORDER — FLUCONAZOLE 150 MG PO TABS: ORAL_TABLET | ORAL | 0 refills | Status: AC

## 2023-12-11 NOTE — Assessment & Plan Note (Addendum)
Chronic, stable control on sumatriptan for rescue.

## 2023-12-11 NOTE — Assessment & Plan Note (Signed)
Stable control  on levo 75 mg.. followed by ENDO.

## 2023-12-11 NOTE — Progress Notes (Signed)
Patient ID: RENLEE FLOOR, female    DOB: 05-19-1971, 53 y.o.   MRN: [id number]  This visit was conducted in person.  BP 100/78 (BP Location: Left Arm, Patient Position: Sitting, Cuff Size: Large)   Pulse 65   Temp 98.1 F (36.7 C) (Temporal)   Ht 5' 6.75" (1.695 m)   Wt 186 lb 2 oz (84.4 kg)   LMP 09/07/2019   SpO2 99%   BMI 29.37 kg/m   BP Readings from Last 3 Encounters:  12/11/23 100/78  04/11/23 110/70  12/04/22 122/78    CC:  Chief Complaint  Patient presents with   Annual Exam    Subjective:   HPI: ROWYNN MCWEENEY is a 53 y.o. female presenting on 12/11/2023 for Annual Exam The patient presents for  complete physical and review of chronic health problems. He/She also has the following acute concerns today:   Recent UTI.[name] since antibiotics vaginal itching and discharge.   Post menopausal syndrome: On HRT Had COVID in  winter and knee surgery for meniscal tear in fall... decreased activity  some, but getting back Asthma moderate persistent: Allergy treatment and albuterol prn.  Hypothyroidism   Stable on levo  112 mcg 5 days a week and 100 mcg  2 days a week   Recent US reviewed small nodule in right thyroid < 4 mm.. no further imagina needed.  Family history of thyroid cancer. Lab Results  Component Value Date   TSH 1.44 12/04/2023    Migraine without Aura:   Seem to be happening 1 times per 3-4 month. Using  sumatriptan prn.    Diet: Healthy Exercise: walking and gym 3-4 times a week.  Wt Readings from Last 3 Encounters:  12/11/23 186 lb 2 oz (84.4 kg)  04/11/23 185 lb (83.9 kg)  12/04/22 186 lb (84.4 kg)   Reviewed labs in detail with patient The 10-year ASCVD risk score (Arnett DK, et al., 2019) is: 0.5%   Values used to calculate the score:     Age: 69 years     Sex: Female     Is Non-Hispanic African American: No     Diabetic: No     Tobacco smoker: No     Systolic Blood Pressure: 100 mmHg     Is BP treated: No     HDL Cholesterol: 77.6  mg/dL     Total Cholesterol: 204 mg/dL  Relevant past medical, surgical, family and social history reviewed and updated as indicated. Interim medical history since our last visit reviewed. Allergies and medications reviewed and updated. Outpatient Medications Prior to Visit  Medication Sig Dispense Refill   clobetasol ointment (TEMOVATE) 0.05 % Apply 1 Application topically 2 (two) times daily as needed.     clotrimazole-betamethasone (LOTRISONE) cream Lotrisone 1 %-0.05 % topical cream  APPLY TO THE AFFECTED AND SURROUNDING AREAS OF SKIN BY TOPICAL ROUTE 2 TIMES PER DAY IN THE MORNING AND EVENING     estradiol (CLIMARA - DOSED IN MG/24 HR) 0.0375 mg/24hr patch Place 0.0375 mg onto the skin once a week.     hydrochlorothiazide (HYDRODIURIL) 25 MG tablet Take 1 tablet (25 mg total) by mouth daily as needed (swelling). 30 tablet 11   Meth-Hyo-M Bl-Na Phos-Ph Sal (URIBEL) 118 MG CAPS Uribel 118 mg-10 mg-40.8 mg-36 mg capsule  1 po tid x 5 days then prn 30 capsule 0   Multiple Vitamin (MULTIVITAMIN) tablet Take 1 tablet by mouth daily.     progesterone (PROMETRIUM) 100 MG capsule  Take 100 mg by mouth daily.     albuterol (VENTOLIN HFA) 108 (90 Base) MCG/ACT inhaler Inhale 2 puffs into the lungs every 6 (six) hours as needed for wheezing. 16 g 0   fluconazole (DIFLUCAN) 150 MG tablet Take 1 tablet PO once. Repeat in 3 days if needed. 2 tablet 0   levothyroxine (SYNTHROID) 75 MCG tablet Take 75 mcg by mouth daily before breakfast.     metoprolol succinate (TOPROL-XL) 25 MG 24 hr tablet Take 1 tablet (25 mg total) by mouth daily. 30 tablet 1   nitrofurantoin, macrocrystal-monohydrate, (MACROBID) 100 MG capsule Take 1 capsule (100 mg total) by mouth 2 (two) times daily. 10 capsule 0   phenazopyridine (PYRIDIUM) 100 MG tablet Take 1 tablet (100 mg total) by mouth 3 (three) times daily as needed for pain. 10 tablet 0   SUMAtriptan (IMITREX) 100 MG tablet Take 1 tablet (100 mg total) by mouth every 2 (two)  hours as needed for migraine. May repeat in 2 hours if headache persists or recurs. 10 tablet 1   No facility-administered medications prior to visit.     Per HPI unless specifically indicated in ROS section below Review of Systems  Constitutional:  Negative for chills and fever.  HENT:  Negative for congestion and ear pain.   Eyes:  Negative for pain and redness.  Respiratory:  Negative for cough and shortness of breath.   Cardiovascular:  Negative for chest pain, palpitations and leg swelling.  Gastrointestinal:  Negative for abdominal pain, blood in stool, constipation, diarrhea, nausea and vomiting.  Genitourinary:  Negative for dysuria.  Musculoskeletal:  Negative for myalgias.  Skin:  Negative for rash.  Neurological:  Negative for dizziness.  Psychiatric/Behavioral:  The patient is not nervous/anxious.    Objective:  BP 100/78 (BP Location: Left Arm, Patient Position: Sitting, Cuff Size: Large)   Pulse 65   Temp 98.1 F (36.7 C) (Temporal)   Ht 5' 6.75" (1.695 m)   Wt 186 lb 2 oz (84.4 kg)   LMP 09/07/2019   SpO2 99%   BMI 29.37 kg/m   Wt Readings from Last 3 Encounters:  12/11/23 186 lb 2 oz (84.4 kg)  04/11/23 185 lb (83.9 kg)  12/04/22 186 lb (84.4 kg)      Physical Exam Constitutional:      General: She is not in acute distress.    Appearance: Normal appearance. She is well-developed. She is not ill-appearing or toxic-appearing.  HENT:     Head: Normocephalic.     Right Ear: Hearing, tympanic membrane, ear canal and external ear normal.     Left Ear: Hearing, tympanic membrane, ear canal and external ear normal.     Nose: Nose normal.  Eyes:     General: Lids are normal. Lids are everted, no foreign bodies appreciated.     Conjunctiva/sclera: Conjunctivae normal.     Pupils: Pupils are equal, round, and reactive to light.  Neck:     Thyroid: No thyroid mass or thyromegaly.     Vascular: No carotid bruit.     Trachea: Trachea normal.  Cardiovascular:      Rate and Rhythm: Normal rate and regular rhythm.     Heart sounds: Normal heart sounds, S1 normal and S2 normal. No murmur heard.    No gallop.  Pulmonary:     Effort: Pulmonary effort is normal. No respiratory distress.     Breath sounds: Normal breath sounds. No wheezing, rhonchi or rales.  Abdominal:  General: Bowel sounds are normal. There is no distension or abdominal bruit.     Palpations: Abdomen is soft. There is no fluid wave or mass.     Tenderness: There is no abdominal tenderness. There is no guarding or rebound.     Hernia: No hernia is present.  Musculoskeletal:     Cervical back: Normal range of motion and neck supple.  Lymphadenopathy:     Cervical: No cervical adenopathy.  Skin:    General: Skin is warm and dry.     Findings: No rash.  Neurological:     Mental Status: She is alert.     Cranial Nerves: No cranial nerve deficit.     Sensory: No sensory deficit.  Psychiatric:        Mood and Affect: Mood is not anxious or depressed.        Speech: Speech normal.        Behavior: Behavior normal. Behavior is cooperative.        Judgment: Judgment normal.       Results for orders placed or performed in visit on 12/04/23  Cortisol-am, blood   Collection Time: 12/04/23  7:58 AM  Result Value Ref Range   Cortisol - AM 18.9 mcg/dL  T3, free   Collection Time: 12/04/23  7:58 AM  Result Value Ref Range   T3, Free 2.9 2.3 - 4.2 pg/mL  T4, free   Collection Time: 12/04/23  7:58 AM  Result Value Ref Range   Free T4 1.06 0.60 - 1.60 ng/dL  TSH   Collection Time: 12/04/23  7:58 AM  Result Value Ref Range   TSH 1.44 0.35 - 5.50 uIU/mL  Comprehensive metabolic panel   Collection Time: 12/04/23  7:58 AM  Result Value Ref Range   Sodium 136 135 - 145 mEq/L   Potassium 4.5 3.5 - 5.1 mEq/L   Chloride 100 96 - 112 mEq/L   CO2 28 19 - 32 mEq/L   Glucose, Bld 100 (H) 70 - 99 mg/dL   BUN 15 6 - 23 mg/dL   Creatinine, Ser 1.30 0.40 - 1.20 mg/dL   Total Bilirubin 0.6  0.2 - 1.2 mg/dL   Alkaline Phosphatase 59 39 - 117 U/L   AST 21 0 - 37 U/L   ALT 19 0 - 35 U/L   Total Protein 6.8 6.0 - 8.3 g/dL   Albumin 4.3 3.5 - 5.2 g/dL   GFR 86.57 >84.69 mL/min   Calcium 9.2 8.4 - 10.5 mg/dL  Lipid panel   Collection Time: 12/04/23  7:58 AM  Result Value Ref Range   Cholesterol 204 (H) 0 - 200 mg/dL   Triglycerides 62.9 0.0 - 149.0 mg/dL   HDL 52.84 >13.24 mg/dL   VLDL 40.1 0.0 - 02.7 mg/dL   LDL Cholesterol 253 (H) 0 - 99 mg/dL   Total CHOL/HDL Ratio 3    NonHDL 126.64     This visit occurred during the SARS-CoV-2 public health emergency.  Safety protocols were in place, including screening questions prior to the visit, additional usage of staff PPE, and extensive cleaning of exam room while observing appropriate contact time as indicated for disinfecting solutions.   COVID 19 screen:  No recent travel or known exposure to COVID19 The patient denies respiratory symptoms of COVID 19 at this time. The importance of social distancing was discussed today.   Assessment and Plan   The patient's preventative maintenance and recommended screening tests for an annual wellness exam were reviewed in  full today. Brought up to date unless services declined.  Counselled on the importance of diet, exercise, and its role in overall health and mortality. The patient's FH and SH was reviewed, including their home life, tobacco status, and drug and alcohol status.   Vaccines: Refused flu, COVID. Consdiering the shingles vaccine. Due for td. Pap/DVE: , Last pap 02/2022, negative Followed by GYN, Dr. Estanislado Pandy Mammo:  11/2022 per GYN,   Colon: no early family history neg 2011, 12/25/2020...repeat in 10 years.  Smoking Status: none ETOH/ drug use: 2-4 glasses of wine a week/none  HIV screen:   refused  hep C due   Problem List Items Addressed This Visit     Asthma, moderate persistent (Chronic)   Stable, chronic.  Continue current medication.  Albuterol prn and allergy  meds.      Hypothyroidism   Stable control  on levo 75 mg.. followed by ENDO.      Relevant Medications   levothyroxine (SYNTHROID) 112 MCG tablet   Migraine without aura     Chronic, stable control on sumatriptan for rescue.      Relevant Medications   SUMAtriptan (IMITREX) 100 MG tablet   Other Visit Diagnoses       Routine general medical examination at a health care facility    -  Primary     Prediabetes             Kerby Nora, MD

## 2023-12-11 NOTE — Assessment & Plan Note (Signed)
Stable, chronic.  Continue current medication.  Albuterol prn and allergy meds.

## 2023-12-11 NOTE — Addendum Note (Signed)
Addended by: Damita Lack on: 12/11/2023 10:14 AM   Modules accepted: Orders

## 2024-01-21 ENCOUNTER — Encounter: Payer: Self-pay | Admitting: Family Medicine

## 2024-01-28 ENCOUNTER — Other Ambulatory Visit: Payer: Self-pay | Admitting: Family Medicine

## 2024-01-28 DIAGNOSIS — Z1231 Encounter for screening mammogram for malignant neoplasm of breast: Secondary | ICD-10-CM

## 2024-02-25 ENCOUNTER — Ambulatory Visit
Admission: RE | Admit: 2024-02-25 | Discharge: 2024-02-25 | Disposition: A | Discharge status: Home / Self Care | Source: Ambulatory Visit | Attending: Family Medicine | Admitting: Family Medicine

## 2024-02-25 DIAGNOSIS — Z1231 Encounter for screening mammogram for malignant neoplasm of breast: Secondary | ICD-10-CM

## 2024-02-27 ENCOUNTER — Encounter: Payer: Self-pay | Admitting: Family Medicine

## 2024-03-01 MED ORDER — HYDROCHLOROTHIAZIDE 25 MG PO TABS: 25.0000 mg | ORAL_TABLET | Freq: Every day | ORAL | 11 refills | Status: AC | PRN

## 2024-03-18 DIAGNOSIS — Z79890 Hormone replacement therapy (postmenopausal): Secondary | ICD-10-CM | POA: Diagnosis not present

## 2024-03-18 DIAGNOSIS — L900 Lichen sclerosus et atrophicus: Secondary | ICD-10-CM | POA: Diagnosis not present

## 2024-03-18 DIAGNOSIS — Z1330 Encounter for screening examination for mental health and behavioral disorders, unspecified: Secondary | ICD-10-CM | POA: Diagnosis not present

## 2024-03-18 DIAGNOSIS — Z01419 Encounter for gynecological examination (general) (routine) without abnormal findings: Secondary | ICD-10-CM | POA: Diagnosis not present

## 2024-03-30 DIAGNOSIS — L820 Inflamed seborrheic keratosis: Secondary | ICD-10-CM | POA: Diagnosis not present

## 2024-03-30 DIAGNOSIS — L650 Telogen effluvium: Secondary | ICD-10-CM | POA: Diagnosis not present

## 2024-03-30 DIAGNOSIS — L218 Other seborrheic dermatitis: Secondary | ICD-10-CM | POA: Diagnosis not present

## 2024-04-07 DIAGNOSIS — R300 Dysuria: Secondary | ICD-10-CM | POA: Diagnosis not present

## 2024-04-07 DIAGNOSIS — R350 Frequency of micturition: Secondary | ICD-10-CM | POA: Diagnosis not present

## 2024-04-07 DIAGNOSIS — N952 Postmenopausal atrophic vaginitis: Secondary | ICD-10-CM | POA: Diagnosis not present

## 2024-04-08 ENCOUNTER — Ambulatory Visit: Admitting: Family Medicine

## 2024-04-13 DIAGNOSIS — N898 Other specified noninflammatory disorders of vagina: Secondary | ICD-10-CM | POA: Diagnosis not present

## 2024-04-13 DIAGNOSIS — R102 Pelvic and perineal pain: Secondary | ICD-10-CM | POA: Diagnosis not present

## 2024-04-13 DIAGNOSIS — N3281 Overactive bladder: Secondary | ICD-10-CM | POA: Diagnosis not present

## 2024-04-13 DIAGNOSIS — B3731 Acute candidiasis of vulva and vagina: Secondary | ICD-10-CM | POA: Diagnosis not present

## 2024-04-21 DIAGNOSIS — N840 Polyp of corpus uteri: Secondary | ICD-10-CM | POA: Diagnosis not present

## 2024-04-21 DIAGNOSIS — B3731 Acute candidiasis of vulva and vagina: Secondary | ICD-10-CM | POA: Diagnosis not present

## 2024-04-21 DIAGNOSIS — N3281 Overactive bladder: Secondary | ICD-10-CM | POA: Diagnosis not present

## 2024-04-21 DIAGNOSIS — R102 Pelvic and perineal pain: Secondary | ICD-10-CM | POA: Diagnosis not present

## 2024-05-07 DIAGNOSIS — N840 Polyp of corpus uteri: Secondary | ICD-10-CM | POA: Diagnosis not present

## 2024-05-19 DIAGNOSIS — N840 Polyp of corpus uteri: Secondary | ICD-10-CM | POA: Diagnosis not present

## 2024-05-19 DIAGNOSIS — N898 Other specified noninflammatory disorders of vagina: Secondary | ICD-10-CM | POA: Diagnosis not present

## 2024-06-01 ENCOUNTER — Emergency Department (HOSPITAL_BASED_OUTPATIENT_CLINIC_OR_DEPARTMENT_OTHER)
Admission: EM | Admit: 2024-06-01 | Discharge: 2024-06-01 | Disposition: A | Discharge status: Home / Self Care | Attending: Emergency Medicine | Admitting: Emergency Medicine

## 2024-06-01 ENCOUNTER — Emergency Department (HOSPITAL_BASED_OUTPATIENT_CLINIC_OR_DEPARTMENT_OTHER)

## 2024-06-01 ENCOUNTER — Other Ambulatory Visit: Payer: Self-pay

## 2024-06-01 ENCOUNTER — Emergency Department (HOSPITAL_BASED_OUTPATIENT_CLINIC_OR_DEPARTMENT_OTHER): Admitting: Radiology

## 2024-06-01 DIAGNOSIS — R1012 Left upper quadrant pain: Secondary | ICD-10-CM | POA: Insufficient documentation

## 2024-06-01 DIAGNOSIS — R1032 Left lower quadrant pain: Secondary | ICD-10-CM | POA: Diagnosis not present

## 2024-06-01 DIAGNOSIS — R079 Chest pain, unspecified: Secondary | ICD-10-CM | POA: Diagnosis not present

## 2024-06-01 DIAGNOSIS — R110 Nausea: Secondary | ICD-10-CM | POA: Diagnosis not present

## 2024-06-01 DIAGNOSIS — K429 Umbilical hernia without obstruction or gangrene: Secondary | ICD-10-CM | POA: Diagnosis not present

## 2024-06-01 DIAGNOSIS — K5730 Diverticulosis of large intestine without perforation or abscess without bleeding: Secondary | ICD-10-CM | POA: Diagnosis not present

## 2024-06-01 DIAGNOSIS — R0789 Other chest pain: Secondary | ICD-10-CM | POA: Diagnosis not present

## 2024-06-01 LAB — URINALYSIS, ROUTINE W REFLEX MICROSCOPIC
Bacteria, UA: NONE SEEN
Bilirubin Urine: NEGATIVE
Glucose, UA: NEGATIVE mg/dL
Hgb urine dipstick: NEGATIVE
Ketones, ur: NEGATIVE mg/dL
Leukocytes,Ua: NEGATIVE
Nitrite: NEGATIVE
Protein, ur: NEGATIVE mg/dL
Specific Gravity, Urine: 1.007 (ref 1.005–1.030)
pH: 6 (ref 5.0–8.0)

## 2024-06-01 LAB — LIPASE, BLOOD: Lipase: 29 U/L (ref 11–51)

## 2024-06-01 LAB — CBC
HCT: 43.3 % (ref 36.0–46.0)
Hemoglobin: 15 g/dL (ref 12.0–15.0)
MCH: 32 pg (ref 26.0–34.0)
MCHC: 34.6 g/dL (ref 30.0–36.0)
MCV: 92.3 fL (ref 80.0–100.0)
Platelets: 226 K/uL (ref 150–400)
RBC: 4.69 MIL/uL (ref 3.87–5.11)
RDW: 12.8 % (ref 11.5–15.5)
WBC: 6.6 K/uL (ref 4.0–10.5)
nRBC: 0 % (ref 0.0–0.2)

## 2024-06-01 LAB — BASIC METABOLIC PANEL WITH GFR
Anion gap: 13 (ref 5–15)
BUN: 10 mg/dL (ref 6–20)
CO2: 24 mmol/L (ref 22–32)
Calcium: 10.1 mg/dL (ref 8.9–10.3)
Chloride: 98 mmol/L (ref 98–111)
Creatinine, Ser: 1.03 mg/dL — ABNORMAL HIGH (ref 0.44–1.00)
GFR, Estimated: >60 mL/min (ref >60)
Glucose, Bld: 102 mg/dL — ABNORMAL HIGH (ref 70–99)
Potassium: 3.8 mmol/L (ref 3.5–5.1)
Sodium: 135 mmol/L (ref 135–145)

## 2024-06-01 LAB — TROPONIN T, HIGH SENSITIVITY
Troponin T High Sensitivity: <15 ng/L (ref <19)
Troponin T High Sensitivity: <15 ng/L (ref <19)

## 2024-06-01 MED ORDER — SODIUM CHLORIDE 0.9 % IV BOLUS
1000.0000 mL | Freq: Once | INTRAVENOUS | Status: AC
  Administered 2024-06-01: 1000 mL via INTRAVENOUS

## 2024-06-01 MED ORDER — KETOROLAC TROMETHAMINE 15 MG/ML IJ SOLN
15.0000 mg | Freq: Once | INTRAMUSCULAR | Status: AC
  Administered 2024-06-01: 15 mg via INTRAVENOUS
  Filled 2024-06-01: qty 1

## 2024-06-01 MED ORDER — IOHEXOL 300 MG/ML  SOLN
100.0000 mL | Freq: Once | INTRAMUSCULAR | Status: AC | PRN
  Administered 2024-06-01: 100 mL via INTRAVENOUS

## 2024-06-01 MED ORDER — PANTOPRAZOLE SODIUM 20 MG PO TBEC: 20.0000 mg | DELAYED_RELEASE_TABLET | Freq: Every day | ORAL | 0 refills | Status: AC

## 2024-06-01 MED ORDER — ONDANSETRON HCL 4 MG/2ML IJ SOLN
4.0000 mg | Freq: Once | INTRAMUSCULAR | Status: AC
  Administered 2024-06-01: 4 mg via INTRAVENOUS
  Filled 2024-06-01: qty 2

## 2024-06-01 MED ORDER — FENTANYL CITRATE PF 50 MCG/ML IJ SOSY
50.0000 ug | PREFILLED_SYRINGE | Freq: Once | INTRAMUSCULAR | Status: DC
  Filled 2024-06-01: qty 1

## 2024-06-01 MED ORDER — FAMOTIDINE 20 MG PO TABS: 20.0000 mg | ORAL_TABLET | Freq: Every day | ORAL | 0 refills | Status: AC

## 2024-06-01 NOTE — ED Provider Notes (Signed)
 [place] EMERGENCY DEPARTMENT AT [facility] Provider Note   CSN: [id number] Arrival date & time: 06/01/24  1309     Patient presents with: Chest Pain   Anna [name] is a 53 y.o. female.  {Add pertinent medical, surgical, social history, OB history to HPI:32947}  Chest Pain Associated symptoms: nausea      53 year old female with medical history significant for endometriosis, ovarian cyst, diverticulitis presenting to the emergency department with left-sided flank pain.  The patient states that she has had pain in her left flank radiating to her left lower quadrant.  She initially thought she had a UTI based on how low the pain was.  Symptoms started this past Sunday and have been worsening.  She denies any chest pain, shortness of breath.  Had a bloated feeling in her abdomen.  Her last bowel movement today and was normal.  She endorses some nausea, denies any vomiting or diarrhea.  No blood in her urine.   Prior to Admission medications   Medication Sig Start Date End Date Taking? Authorizing Provider  clobetasol ointment (TEMOVATE) 0.05 % Apply 1 Application topically 2 (two) times daily as needed.    [provider]  clotrimazole-betamethasone (LOTRISONE) cream Lotrisone 1 %-0.05 % topical cream  APPLY TO THE AFFECTED AND SURROUNDING AREAS OF SKIN BY TOPICAL ROUTE 2 TIMES PER DAY IN THE MORNING AND EVENING    [provider]  estradiol  (CLIMARA  - DOSED IN MG/24 HR) 0.0375 mg/24hr patch Place 0.0375 mg onto the skin once a week.    [provider]  fluconazole  (DIFLUCAN ) 150 MG tablet Take 1 tablet PO once. Repeat in 3 days if needed. 12/11/23   Bedsole, Amy E, MD  hydrochlorothiazide  (HYDRODIURIL ) 25 MG tablet Take 1 tablet (25 mg total) by mouth daily as needed (swelling). 03/01/24   Avelina Greig BRAVO, MD  levothyroxine  (SYNTHROID ) 112 MCG tablet Take 1 tablet (112 mcg total) by mouth daily before breakfast. 112 mcg 5 days a week and 1000 mcg 2 days a  week 12/11/23   Bedsole, Amy E, MD  Meth-Hyo-M Starlene Phos-Ph Sal (URIBEL ) 118 MG CAPS Uribel  118 mg-10 mg-40.8 mg-36 mg capsule  1 po tid x 5 days then prn 06/27/22   Bedsole, Amy E, MD  Multiple Vitamin (MULTIVITAMIN) tablet Take 1 tablet by mouth daily.    [provider]  progesterone (PROMETRIUM) 100 MG capsule Take 100 mg by mouth daily.    [provider]  SUMAtriptan  (IMITREX ) 100 MG tablet Take 1 tablet (100 mg total) by mouth every 2 (two) hours as needed for migraine. May repeat in 2 hours if headache persists or recurs. 12/11/23   Avelina Greig BRAVO, MD    Allergies: Penicillins and Sulfonamide derivatives    Review of Systems  Gastrointestinal:  Positive for nausea.  Genitourinary:  Positive for flank pain.  All other systems reviewed and are negative.   Updated Vital Signs BP 134/80   Pulse 66   Temp 98.5 F (36.9 C) (Oral)   Resp 16   Wt 82.6 kg   LMP 09/07/2019   SpO2 99%   BMI 28.72 kg/m   Physical Exam Vitals and nursing note reviewed.  Constitutional:      General: She is not in acute distress.    Appearance: She is well-developed.  HENT:     Head: Normocephalic and atraumatic.  Eyes:     Conjunctiva/sclera: Conjunctivae normal.  Cardiovascular:     Rate and Rhythm: Normal rate and  regular rhythm.     Heart sounds: No murmur heard. Pulmonary:     Effort: Pulmonary effort is normal. No respiratory distress.     Breath sounds: Normal breath sounds.  Abdominal:     Palpations: Abdomen is soft.     Tenderness: There is abdominal tenderness in the left lower quadrant. There is no right CVA tenderness, left CVA tenderness or guarding.  Musculoskeletal:        General: No swelling.     Cervical back: Neck supple.  Skin:    General: Skin is warm and dry.     Capillary Refill: Capillary refill takes less than 2 seconds.  Neurological:     Mental Status: She is alert.  Psychiatric:        Mood and Affect: Mood normal.     (all labs ordered  are listed, but only abnormal results are displayed) Labs Reviewed  BASIC METABOLIC PANEL WITH GFR - Abnormal; Notable for the following components:      Result Value   Glucose, Bld 102 (*)    Creatinine, Ser 1.03 (*)    All other components within normal limits  URINALYSIS, ROUTINE W REFLEX MICROSCOPIC - Abnormal; Notable for the following components:   Color, Urine YELLOW (*)    All other components within normal limits  CBC  LIPASE, BLOOD  TROPONIN T, HIGH SENSITIVITY  TROPONIN T, HIGH SENSITIVITY    EKG: EKG Interpretation Date/Time:  Tuesday June 01 2024 13:22:18 EDT Ventricular Rate:  58 PR Interval:  170 QRS Duration:  90 QT Interval:  434 QTC Calculation: 427 R Axis:   83  Text Interpretation: Sinus rhythm Probable anteroseptal infarct, old Confirmed by Jerrol Agent ([id number]) on 06/01/2024 1:26:02 PM  Radiology: No results found.  {Document cardiac monitor, telemetry assessment procedure when appropriate:32947} Procedures   Medications Ordered in the ED  ketorolac  (TORADOL ) 15 MG/ML injection 15 mg (has no administration in time range)  ondansetron  (ZOFRAN ) injection 4 mg (4 mg Intravenous Given 06/01/24 1441)  sodium chloride  0.9 % bolus 1,000 mL (1,000 mLs Intravenous New Bag/Given 06/01/24 1445)      {Click here for ABCD2, HEART and other calculators REFRESH Note before signing:1}                              Medical Decision Making Amount and/or Complexity of Data Reviewed Labs: ordered. Radiology: ordered.  Risk Prescription drug management.    53 year old female with medical history significant for endometriosis, ovarian cyst, diverticulitis presenting to the emergency department with left-sided flank pain.  The patient states that she has had pain in her left flank radiating to her left lower quadrant.  She initially thought she had a UTI based on how low the pain was.  Symptoms started this past Sunday and have been worsening.  She denies any chest pain,  shortness of breath.  Had a bloated feeling in her abdomen.  Her last bowel movement today and was normal.  She endorses some nausea, denies any vomiting or diarrhea.  No blood in her urine.   On arrival, the patient was afebrile, vitally stable, saturating well on room air.  Physical exam with some left lower quadrant tenderness to palpation.  Patient denies chest pain, shortness of breath, had endorsed left-sided flank pain, denies sharp pain, pleuritic pain, chest pressure.  Concern for diverticulitis versus nephrolithiasis.  ***  {Document critical care time when appropriate  Document review of labs and  clinical decision tools ie CHADS2VASC2, etc  Document your independent review of radiology images and any outside records  Document your discussion with family members, caretakers and with consultants  Document social determinants of health affecting pt's care  Document your decision making why or why not admission, treatments were needed:32947:::1}   Final diagnoses:  None    ED Discharge Orders     None

## 2024-06-01 NOTE — ED Notes (Signed)
 Pt aware of the need for a urine... Pt currently unable to provide a sample.SABRASABRA

## 2024-06-01 NOTE — Discharge Instructions (Addendum)
 Your workup in the emergency department was overall reassuring with normal cardiac enzymes, normal chest x-ray, normal CT of the abdomen/pelvis and normal laboratory evaluation.  Trial a course of Pepcid  and Protonix , follow-up with your primary care provider for further diagnostic evaluation outpatient, return to the emergency department for any severe worsening pain.

## 2024-06-01 NOTE — ED Triage Notes (Addendum)
 Left flank, non-tender chest/rib pain. Initially thought she had a UTI based on how low the pain was. Started Sunday-worsening. No worse with deep breathing. Bloated feeling. Last BM today. +N/-V/-D. 7/11 has uterine biopsies-exploratory for polys-all came back benign.

## 2024-06-01 NOTE — ED Notes (Signed)
 DC paperwork given and verbally understood.

## 2024-06-02 ENCOUNTER — Encounter: Payer: Self-pay | Admitting: Family Medicine

## 2024-06-02 ENCOUNTER — Ambulatory Visit: Admitting: Family Medicine

## 2024-06-02 VITALS — BP 136/82 | HR 71 | Temp 98.7°F | Ht 66.75 in | Wt 183.8 lb

## 2024-06-02 DIAGNOSIS — R1013 Epigastric pain: Secondary | ICD-10-CM

## 2024-06-02 MED ORDER — SUCRALFATE 1 G PO TABS: 1.0000 g | ORAL_TABLET | Freq: Three times a day (TID) | ORAL | 0 refills | Status: AC

## 2024-06-02 NOTE — Progress Notes (Signed)
 Patient ID: Anna [name], female    DOB: Apr 26, 1971, 53 y.o.   MRN: [id number]  This visit was conducted in person.  BP 136/82   Pulse 71   Temp 98.7 F (37.1 C) (Oral)   Ht 5' 6.75 (1.695 m)   Wt 183 lb 12.8 oz (83.4 kg)   LMP 09/07/2019   SpO2 97%   BMI 29.00 kg/m    CC:  Chief Complaint  Patient presents with   Follow-up    06/01/24 she said that they could not find anything at the ED.    Medication Adherence    She was prescribed pantoprazole  and Pepcid  in the ED yesterday she has not picked up and is wondering should take it     Subjective:   HPI: Anna [name] is a 53 y.o. female with history of endometriosis, ovarian cyst and diverticulosis presenting on 06/02/2024 for Follow-up (06/01/24 she said that they could not find anything at the ED. ) and Medication Adherence (She was prescribed pantoprazole  and Pepcid  in the ED yesterday she has not picked up and is wondering should take it )   She presents for ED follow-up.  Seen on August 5, yesterday Presenting symptoms included left-sided flank pain radiating to left lower quadrant. Associated with bloating in the abdomen Denied diarrhea and vomiting but did have mild nausea  EKG unremarkable except for sinus bradycardia Basic metabolic panel and CBC within normal limits..  No leukocytosis or anemia UA clear Lipase and troponin x 2 normal Chest x-ray: No active cardiopulmonary disease Treated with Zofran  and 1 L IV fluids.  Given Ketoralac 15 mg for pain. CT abdomen and pelvis: IMPRESSION: 1. No acute abnormality. 2. Minimal sigmoid colon diverticulosis without evidence of diverticulitis.  Started patient on Pepcid  twice daily and Protonix  daily for possible peptic ulcer disease versus gastritis.  Today she reports  pain has returned but more mild.. burning in LUQ / epigastrium.  Constant irritation.  She has not eaten  She has not yet started pepcid  or protonix .   No recent ibuprofen  or aleve.  Relevant past  medical, surgical, family and social history reviewed and updated as indicated. Interim medical history since our last visit reviewed. Allergies and medications reviewed and updated. Outpatient Medications Prior to Visit  Medication Sig Dispense Refill   clobetasol ointment (TEMOVATE) 0.05 % Apply 1 Application topically 2 (two) times daily as needed.     clotrimazole-betamethasone (LOTRISONE) cream Lotrisone 1 %-0.05 % topical cream  APPLY TO THE AFFECTED AND SURROUNDING AREAS OF SKIN BY TOPICAL ROUTE 2 TIMES PER DAY IN THE MORNING AND EVENING     estradiol  (CLIMARA  - DOSED IN MG/24 HR) 0.0375 mg/24hr patch Place 0.0375 mg onto the skin once a week.     fluconazole  (DIFLUCAN ) 150 MG tablet Take 1 tablet PO once. Repeat in 3 days if needed. 2 tablet 0   hydrochlorothiazide  (HYDRODIURIL ) 25 MG tablet Take 1 tablet (25 mg total) by mouth daily as needed (swelling). 30 tablet 11   levothyroxine  (SYNTHROID ) 112 MCG tablet Take 1 tablet (112 mcg total) by mouth daily before breakfast. 112 mcg 5 days a week and 1000 mcg 2 days a week     Meth-Hyo-M Bl-Na Phos-Ph Sal (URIBEL ) 118 MG CAPS Uribel  118 mg-10 mg-40.8 mg-36 mg capsule  1 po tid x 5 days then prn 30 capsule 0   Multiple Vitamin (MULTIVITAMIN) tablet Take 1 tablet by mouth daily.     progesterone (PROMETRIUM) 100 MG capsule  Take 100 mg by mouth daily.     SUMAtriptan  (IMITREX ) 100 MG tablet Take 1 tablet (100 mg total) by mouth every 2 (two) hours as needed for migraine. May repeat in 2 hours if headache persists or recurs. 10 tablet 1   famotidine  (PEPCID ) 20 MG tablet Take 1 tablet (20 mg total) by mouth daily. (Patient not taking: Reported on 06/02/2024) 30 tablet 0   pantoprazole  (PROTONIX ) 20 MG tablet Take 1 tablet (20 mg total) by mouth daily. (Patient not taking: Reported on 06/02/2024) 30 tablet 0   No facility-administered medications prior to visit.     Per HPI unless specifically indicated in ROS section below Review of Systems   Constitutional:  Negative for fatigue and fever.  HENT:  Negative for congestion.   Eyes:  Negative for pain.  Respiratory:  Negative for cough and shortness of breath.   Cardiovascular:  Negative for chest pain, palpitations and leg swelling.  Gastrointestinal:  Positive for abdominal pain.  Genitourinary:  Negative for dysuria and vaginal bleeding.  Musculoskeletal:  Positive for back pain.  Neurological:  Negative for syncope, light-headedness and headaches.  Psychiatric/Behavioral:  Negative for dysphoric mood.    Objective:  BP 136/82   Pulse 71   Temp 98.7 F (37.1 C) (Oral)   Ht 5' 6.75 (1.695 m)   Wt 183 lb 12.8 oz (83.4 kg)   LMP 09/07/2019   SpO2 97%   BMI 29.00 kg/m   Wt Readings from Last 3 Encounters:  06/02/24 183 lb 12.8 oz (83.4 kg)  06/01/24 182 lb (82.6 kg)  12/11/23 186 lb 2 oz (84.4 kg)      Physical Exam Constitutional:      General: She is not in acute distress.    Appearance: Normal appearance. She is well-developed. She is not ill-appearing or toxic-appearing.  HENT:     Head: Normocephalic.     Right Ear: Hearing, tympanic membrane, ear canal and external ear normal. Tympanic membrane is not erythematous, retracted or bulging.     Left Ear: Hearing, tympanic membrane, ear canal and external ear normal. Tympanic membrane is not erythematous, retracted or bulging.     Nose: No mucosal edema or rhinorrhea.     Right Sinus: No maxillary sinus tenderness or frontal sinus tenderness.     Left Sinus: No maxillary sinus tenderness or frontal sinus tenderness.     Mouth/Throat:     Pharynx: Uvula midline.  Eyes:     General: Lids are normal. Lids are everted, no foreign bodies appreciated.     Conjunctiva/sclera: Conjunctivae normal.     Pupils: Pupils are equal, round, and reactive to light.  Neck:     Thyroid : No thyroid  mass or thyromegaly.     Vascular: No carotid bruit.     Trachea: Trachea normal.  Cardiovascular:     Rate and Rhythm: Normal  rate and regular rhythm.     Pulses: Normal pulses.     Heart sounds: Normal heart sounds, S1 normal and S2 normal. No murmur heard.    No friction rub. No gallop.  Pulmonary:     Effort: Pulmonary effort is normal. No tachypnea or respiratory distress.     Breath sounds: Normal breath sounds. No decreased breath sounds, wheezing, rhonchi or rales.  Abdominal:     General: Bowel sounds are normal.     Palpations: Abdomen is soft.     Tenderness: There is abdominal tenderness in the periumbilical area and left upper quadrant. There is  left CVA tenderness. There is no right CVA tenderness.     Comments:  Slight redness of skin on left upper abdomen.. possibly from rubbing, no overt rash, no vesicles, no hypersensititivty  Musculoskeletal:     Cervical back: Normal range of motion and neck supple.  Skin:    General: Skin is warm and dry.     Findings: No rash.  Neurological:     Mental Status: She is alert.  Psychiatric:        Mood and Affect: Mood is not anxious or depressed.        Speech: Speech normal.        Behavior: Behavior normal. Behavior is cooperative.        Thought Content: Thought content normal.        Judgment: Judgment normal.       Results for orders placed or performed during the hospital encounter of 06/01/24  Basic metabolic panel   Collection Time: 06/01/24  1:48 PM  Result Value Ref Range   Sodium 135 135 - 145 mmol/L   Potassium 3.8 3.5 - 5.1 mmol/L   Chloride 98 98 - 111 mmol/L   CO2 24 22 - 32 mmol/L   Glucose, Bld 102 (H) 70 - 99 mg/dL   BUN 10 6 - 20 mg/dL   Creatinine, Ser 8.96 (H) 0.44 - 1.00 mg/dL   Calcium 89.8 8.9 - 89.6 mg/dL   GFR, Estimated >39 >39 mL/min   Anion gap 13 5 - 15  CBC   Collection Time: 06/01/24  1:48 PM  Result Value Ref Range   WBC 6.6 4.0 - 10.5 K/uL   RBC 4.69 3.87 - 5.11 MIL/uL   Hemoglobin 15.0 12.0 - 15.0 g/dL   HCT 56.6 63.9 - 53.9 %   MCV 92.3 80.0 - 100.0 fL   MCH 32.0 26.0 - 34.0 pg   MCHC 34.6 30.0 - 36.0  g/dL   RDW 87.1 88.4 - 84.4 %   Platelets 226 150 - 400 K/uL   nRBC 0.0 0.0 - 0.2 %  Urinalysis, Routine w reflex microscopic -Urine, Clean Catch   Collection Time: 06/01/24  1:48 PM  Result Value Ref Range   Color, Urine YELLOW (A) YELLOW   APPearance CLEAR CLEAR   Specific Gravity, Urine 1.007 1.005 - 1.030   pH 6.0 5.0 - 8.0   Glucose, UA NEGATIVE NEGATIVE mg/dL   Hgb urine dipstick NEGATIVE NEGATIVE   Bilirubin Urine NEGATIVE NEGATIVE   Ketones, ur NEGATIVE NEGATIVE mg/dL   Protein, ur NEGATIVE NEGATIVE mg/dL   Nitrite NEGATIVE NEGATIVE   Leukocytes,Ua NEGATIVE NEGATIVE   RBC / HPF 0-5 0 - 5 RBC/hpf   WBC, UA 0-5 0 - 5 WBC/hpf   Bacteria, UA NONE SEEN NONE SEEN   Squamous Epithelial / HPF 0-5 0 - 5 /HPF  Lipase, blood   Collection Time: 06/01/24  1:48 PM  Result Value Ref Range   Lipase 29 11 - 51 U/L  Troponin T, High Sensitivity   Collection Time: 06/01/24  1:48 PM  Result Value Ref Range   Troponin T High Sensitivity <15 <19 ng/L  Troponin T, High Sensitivity   Collection Time: 06/01/24  3:35 PM  Result Value Ref Range   Troponin T High Sensitivity <15 <19 ng/L    Assessment and Plan  Epigastric pain Assessment & Plan: Acute , extensive ER work up reviewed.  She has not yet started PPI or H2blocker.  Will check H. pylori breath test. Most  likely diagnosis gastritis vs PUD. Avoid acid triggers.  Start famotidine  BID and carafate  suspension.   If not improving add pantoprazole   daily.  Could consider RUQ US  for gallbladder issue or  referral to GI for EGD.  Orders: -     H. pylori breath test  Other orders -     Sucralfate ; Take 1 tablet (1 g total) by mouth with breakfast, with lunch, and with evening meal for 15 days.  Dispense: 45 tablet; Refill: 0    No follow-ups on file.   Greig Ring, MD

## 2024-06-02 NOTE — Assessment & Plan Note (Signed)
 Acute , extensive ER work up reviewed.  She has not yet started PPI or H2blocker.  Will check H. pylori breath test. Most likely diagnosis gastritis vs PUD. Avoid acid triggers.  Start famotidine  BID and carafate  suspension.   If not improving add pantoprazole   daily.  Could consider RUQ US  for gallbladder issue or  referral to GI for EGD.

## 2024-06-03 ENCOUNTER — Ambulatory Visit: Payer: Self-pay | Admitting: Family Medicine

## 2024-06-03 DIAGNOSIS — G479 Sleep disorder, unspecified: Secondary | ICD-10-CM | POA: Diagnosis not present

## 2024-06-03 DIAGNOSIS — N951 Menopausal and female climacteric states: Secondary | ICD-10-CM | POA: Diagnosis not present

## 2024-06-03 DIAGNOSIS — L659 Nonscarring hair loss, unspecified: Secondary | ICD-10-CM | POA: Diagnosis not present

## 2024-06-03 DIAGNOSIS — R300 Dysuria: Secondary | ICD-10-CM | POA: Diagnosis not present

## 2024-06-03 LAB — H. PYLORI BREATH TEST: H. pylori Breath Test: NOT DETECTED

## 2024-06-04 ENCOUNTER — Encounter: Payer: Self-pay | Admitting: Family Medicine

## 2024-10-08 DIAGNOSIS — N898 Other specified noninflammatory disorders of vagina: Secondary | ICD-10-CM | POA: Diagnosis not present
